# Patient Record
Sex: Female | Born: 1985
Health system: Southern US, Community
[De-identification: ages and names within clinical notes are randomized; demographics above are authoritative.]

## PROBLEM LIST (undated history)

## (undated) ENCOUNTER — Inpatient Hospital Stay (HOSPITAL_COMMUNITY): Payer: Self-pay

## (undated) DIAGNOSIS — J45909 Unspecified asthma, uncomplicated: Secondary | ICD-10-CM

## (undated) DIAGNOSIS — H10823 Rosacea conjunctivitis, bilateral: Secondary | ICD-10-CM

## (undated) DIAGNOSIS — T7840XA Allergy, unspecified, initial encounter: Secondary | ICD-10-CM

## (undated) DIAGNOSIS — J302 Other seasonal allergic rhinitis: Secondary | ICD-10-CM

## (undated) DIAGNOSIS — L718 Other rosacea: Secondary | ICD-10-CM

## (undated) DIAGNOSIS — G43909 Migraine, unspecified, not intractable, without status migrainosus: Secondary | ICD-10-CM

## (undated) HISTORY — DX: Allergy, unspecified, initial encounter: T78.40XA

## (undated) HISTORY — DX: Rosacea conjunctivitis, bilateral: H10.823

## (undated) HISTORY — PX: RETINAL LASER PROCEDURE: SHX2339

## (undated) HISTORY — DX: Other rosacea: L71.8

## (undated) HISTORY — PX: TONSILLECTOMY: SUR1361

## (undated) HISTORY — DX: Migraine, unspecified, not intractable, without status migrainosus: G43.909

## (undated) HISTORY — PX: SINUS SURGERY WITH INSTATRAK: SHX5215

---
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
## 2008-03-08 ENCOUNTER — Other Ambulatory Visit: Admission: RE | Admit: 2008-03-08 | Discharge: 2008-03-08 | Payer: Self-pay | Admitting: Obstetrics and Gynecology

## 2008-10-03 ENCOUNTER — Ambulatory Visit: Payer: Self-pay | Admitting: Diagnostic Radiology

## 2008-10-03 ENCOUNTER — Emergency Department (HOSPITAL_BASED_OUTPATIENT_CLINIC_OR_DEPARTMENT_OTHER): Admission: EM | Admit: 2008-10-03 | Discharge: 2008-10-03 | Payer: Self-pay | Admitting: Emergency Medicine

## 2009-03-01 LAB — HM PAP SMEAR

## 2012-05-15 ENCOUNTER — Ambulatory Visit (INDEPENDENT_AMBULATORY_CARE_PROVIDER_SITE_OTHER): Payer: BC Managed Care – PPO | Admitting: Family Medicine

## 2012-05-15 ENCOUNTER — Encounter: Payer: Self-pay | Admitting: Family Medicine

## 2012-05-15 VITALS — BP 110/60 | HR 90 | Temp 98.3°F | Ht 65.25 in | Wt 118.8 lb

## 2012-05-15 DIAGNOSIS — G43909 Migraine, unspecified, not intractable, without status migrainosus: Secondary | ICD-10-CM | POA: Insufficient documentation

## 2012-05-15 DIAGNOSIS — M62838 Other muscle spasm: Secondary | ICD-10-CM

## 2012-05-15 DIAGNOSIS — M7989 Other specified soft tissue disorders: Secondary | ICD-10-CM

## 2012-05-15 DIAGNOSIS — R040 Epistaxis: Secondary | ICD-10-CM | POA: Insufficient documentation

## 2012-05-15 DIAGNOSIS — M799 Soft tissue disorder, unspecified: Secondary | ICD-10-CM

## 2012-05-15 MED ORDER — CYCLOBENZAPRINE HCL 10 MG PO TABS: 10.0000 mg | ORAL_TABLET | Freq: Three times a day (TID) | ORAL | Status: DC | PRN

## 2012-05-15 NOTE — Patient Instructions (Addendum)
Follow up in 6 weeks to recheck symptoms Start Zyrtec daily to decrease allergies Do nasal saline rinses prior to bed and then apply neosporin If nose bleed- use OB, ice bridge of nose, lean forward Continue the Excedrin migraine as needed Use the flexeril at night for muscle spasm- will make you drowsy HEAT! Aleve (2 at a time w/ food) for neck/shoulder pain We'll call you with your ultrasound appt Call with any questions or concerns Welcome!  We're glad to have you!

## 2012-05-15 NOTE — Progress Notes (Signed)
  Subjective:    Patient ID: Caitlin Duarte, female    DOB: Apr 01, 1985, 27 y.o.   MRN: [id number]  HPI New to establish.  Previous MD- Bloss[facility] Hoops.    S/p sinus surgery 2006.  Now having nose bleeds 'multiple times/week'.  Not draining down back of throat.  Bleeding anteriorly.  Bleeding will alternate nostrils, initially thought it was more the R side.  Will wake up w/ blood on pillow.  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  Pt reports nose doesn't bleed for long.  R sided neck and shoulder pain- will radiate into arm, will get headaches.  Pt has asymmetric musculature- R side is more developed but pt is R hand dominant.  L shoulder is higher and 'there's something there'.  Saw multiple providers in [place] for evaluation.  Had cervical MRI that showed possible disc abnormality.  No N/V recently but had previously.  Migraines- chronic problem, started in HS.  Pt has abortive med available to use at onset of sxs.  Will take excedrin migraine early in HA course   Review of Systems For ROS see HPI     Objective:   Physical Exam  Vitals reviewed. Constitutional: She is oriented to person, place, and time. She appears well-developed and well-nourished. No distress.  HENT:  Head: Normocephalic and atraumatic.  Right Ear: Tympanic membrane normal.  Left Ear: Tympanic membrane normal.  Nose: Mucosal edema and rhinorrhea present. Right sinus exhibits no maxillary sinus tenderness and no frontal sinus tenderness. Left sinus exhibits no maxillary sinus tenderness and no frontal sinus tenderness.  Mouth/Throat: Mucous membranes are normal. Posterior oropharyngeal erythema (w/ PND) present.  Eyes: Conjunctivae and EOM are normal. Pupils are equal, round, and reactive to light.  Neck: Normal range of motion. No thyromegaly present.  + trap spasm bilaterally L soft tissue mass in trap- suspicious for lipoma  Cardiovascular: Normal rate, regular rhythm and normal heart sounds.   Pulmonary/Chest: Effort normal  and breath sounds normal. No respiratory distress. She has no wheezes. She has no rales.  Lymphadenopathy:    She has no cervical adenopathy.  Neurological: She is alert and oriented to person, place, and time. She has normal reflexes. No cranial nerve deficit. Coordination normal.  Skin: Skin is warm and dry.  Psychiatric: She has a normal mood and affect. Her behavior is normal.          Assessment & Plan:

## 2012-05-18 NOTE — Assessment & Plan Note (Signed)
New.  Start scheduled NSAIDs, flexeril prn.  Heat.  Reviewed supportive care and red flags that should prompt return.  Pt expressed understanding and is in agreement w/ plan.  

## 2012-05-18 NOTE — Assessment & Plan Note (Signed)
New to provider, ongoing for pt.  Pt using excedrin migraine and then abortive meds prn.  Will follow.

## 2012-05-18 NOTE — Assessment & Plan Note (Signed)
New.  Suspect lipoma in L trap.  Pt has had extensive neuro/neurosurg w/u but has not had [place].  Will get imaging to assess

## 2012-05-18 NOTE — Assessment & Plan Note (Signed)
New.  No obvious scabs or nasal abnormalities.  + mucosal edema and PND consistent w/ seasonal allergies.  Due to recent nose bleeds will not start nasal steroid.  Will start OTC antihistamine.  Reviewed supportive care and red flags that should prompt return.  Pt expressed understanding and is in agreement w/ plan.

## 2012-05-28 ENCOUNTER — Encounter: Payer: Self-pay | Admitting: Family Medicine

## 2012-06-09 ENCOUNTER — Ambulatory Visit (HOSPITAL_BASED_OUTPATIENT_CLINIC_OR_DEPARTMENT_OTHER)
Admission: RE | Admit: 2012-06-09 | Discharge: 2012-06-09 | Disposition: A | Discharge status: Home / Self Care | Payer: BC Managed Care – PPO | Source: Ambulatory Visit | Attending: Family Medicine | Admitting: Family Medicine

## 2012-06-09 DIAGNOSIS — R221 Localized swelling, mass and lump, neck: Secondary | ICD-10-CM | POA: Insufficient documentation

## 2012-06-09 DIAGNOSIS — M7989 Other specified soft tissue disorders: Secondary | ICD-10-CM

## 2012-06-09 DIAGNOSIS — R220 Localized swelling, mass and lump, head: Secondary | ICD-10-CM | POA: Insufficient documentation

## 2012-07-02 ENCOUNTER — Encounter: Payer: Self-pay | Admitting: Lab

## 2012-07-03 ENCOUNTER — Ambulatory Visit: Payer: BC Managed Care – PPO | Admitting: Family Medicine

## 2012-07-03 DIAGNOSIS — Z0289 Encounter for other administrative examinations: Secondary | ICD-10-CM

## 2012-08-07 ENCOUNTER — Ambulatory Visit (INDEPENDENT_AMBULATORY_CARE_PROVIDER_SITE_OTHER): Payer: BC Managed Care – PPO | Admitting: Family Medicine

## 2012-08-07 ENCOUNTER — Encounter: Payer: Self-pay | Admitting: Family Medicine

## 2012-08-07 VITALS — BP 126/80 | HR 105 | Temp 98.5°F | Ht 65.25 in | Wt 112.2 lb

## 2012-08-07 DIAGNOSIS — J0100 Acute maxillary sinusitis, unspecified: Secondary | ICD-10-CM

## 2012-08-07 MED ORDER — AMOXICILLIN 875 MG PO TABS: 875.0000 mg | ORAL_TABLET | Freq: Two times a day (BID) | ORAL | Status: DC

## 2012-08-07 MED ORDER — PROMETHAZINE-DM 6.25-15 MG/5ML PO SYRP: 5.0000 mL | ORAL_SOLUTION | Freq: Four times a day (QID) | ORAL | Status: DC | PRN

## 2012-08-07 NOTE — Progress Notes (Signed)
  Subjective:    Patient ID: Caitlin Duarte, female    DOB: [birth year], 27 y.o.   MRN: [id number]  HPI URI-sxs started 2 weeks ago w/ allergy sxs (watery eyes, nasal congestion).  sxs have worsened rather than improved.  + cough, productive.  + sinus pain/pressure.  + tooth pain.  + HA.  No fevers.  + sick contacts.  No N/V/D.   Review of Systems For ROS see HPI     Objective:   Physical Exam  Vitals reviewed. Constitutional: She appears well-developed and well-nourished. No distress.  HENT:  Head: Normocephalic and atraumatic.  Right Ear: Tympanic membrane normal.  Left Ear: Tympanic membrane normal.  Nose: Mucosal edema and rhinorrhea present. Right sinus exhibits maxillary sinus tenderness and frontal sinus tenderness. Left sinus exhibits maxillary sinus tenderness and frontal sinus tenderness.  Mouth/Throat: Uvula is midline and mucous membranes are normal. Posterior oropharyngeal erythema present. No oropharyngeal exudate.  Eyes: Conjunctivae and EOM are normal. Pupils are equal, round, and reactive to light.  Neck: Normal range of motion. Neck supple.  Cardiovascular: Normal rate, regular rhythm and normal heart sounds.   Pulmonary/Chest: Effort normal and breath sounds normal. No respiratory distress. She has no wheezes.  Lymphadenopathy:    She has no cervical adenopathy.          Assessment & Plan:

## 2012-08-07 NOTE — Assessment & Plan Note (Signed)
New.  Pt's sxs and PE consistent w/ infxn.  Start abx.  Cough meds prn.  Reviewed supportive care and red flags that should prompt return.  Pt expressed understanding and is in agreement w/ plan.  

## 2012-08-07 NOTE — Patient Instructions (Addendum)
This is a sinus infection Start the Amox twice daily- take w/ food Drink plenty of fluids Cough syrup for nights and weekend- will cause drowsiness Mucinex DM for daytime cough REST! Hang in there!!!

## 2012-09-03 ENCOUNTER — Ambulatory Visit: Payer: BC Managed Care – PPO | Admitting: Family Medicine

## 2012-09-08 ENCOUNTER — Ambulatory Visit: Payer: Self-pay | Admitting: Nurse Practitioner

## 2012-09-15 ENCOUNTER — Ambulatory Visit (INDEPENDENT_AMBULATORY_CARE_PROVIDER_SITE_OTHER): Payer: BC Managed Care – PPO | Admitting: Obstetrics and Gynecology

## 2012-09-15 ENCOUNTER — Encounter: Payer: Self-pay | Admitting: Obstetrics and Gynecology

## 2012-09-15 VITALS — BP 100/58 | HR 88 | Ht 65.0 in | Wt 112.0 lb

## 2012-09-15 DIAGNOSIS — Z01419 Encounter for gynecological examination (general) (routine) without abnormal findings: Secondary | ICD-10-CM

## 2012-09-15 DIAGNOSIS — Z0000 Encounter for general adult medical examination without abnormal findings: Secondary | ICD-10-CM

## 2012-09-15 LAB — POCT URINALYSIS DIPSTICK
Blood, UA: NEGATIVE
Nitrite, UA: NEGATIVE
Protein, UA: NEGATIVE
Urobilinogen, UA: NEGATIVE
pH, UA: 5

## 2012-09-15 MED ORDER — NORETHIN ACE-ETH ESTRAD-FE 1-20 MG-MCG(24) PO TABS: 1.0000 | ORAL_TABLET | Freq: Every day | ORAL | Status: DC

## 2012-09-15 NOTE — Patient Instructions (Signed)

## 2012-09-15 NOTE — Progress Notes (Signed)
Patient ID: Caitlin Duarte, female   DOB: 06-25-1985, 27 y.o.   MRN: [id number] 27 y.o.   Single    Caucasian   female  LMP 08/25/12. G0P0   here for annual exam.   Wants to be back on birth control.  Used LoEstrin 1/20 Fe in past.  Getting married.  Declines STD check. Menses always heavy and regular within a few days.  Nausea and cramps with menses, not every month.   Patient's last menstrual period was 08/25/2012.          Sexually active: yes  The current method of family planning is none.    Exercising: cardio and exercise classes Last mammogram:  never Last pap smear:02/2009 wnl History of abnormal pap: no Smoking:no Alcohol: socially Last colonoscopy: never Last Bone Density:  never Last tetanus shot: up to date Status post Gardasil vaccines. Last cholesterol check: unsure  Hgb: 14              Urine: Neg   Family History  Problem Relation Age of Onset  . Anemia Sister   . Thyroid disease Sister   . Diabetes Paternal Grandmother   . Hypertension Paternal Grandmother   . Diabetes Paternal Grandfather   . Hypertension Paternal Grandfather   . Alzheimer's disease Maternal Grandfather   . Hypertension Father   . Diabetes Father   . Stroke Maternal Grandmother     Patient Active Problem List   Diagnosis Date Noted  . Sinusitis, acute maxillary 08/07/2012  . Epistaxis 05/15/2012  . Migraine, unspecified, without mention of intractable migraine without mention of status migrainosus 05/15/2012  . Soft tissue mass 05/15/2012  . Trapezius muscle spasm 05/15/2012  . MOLLUSCUM CONTAGIOSUM 10/23/2006    Past Medical History  Diagnosis Date  . Migraines     Past Surgical History  Procedure Laterality Date  . Eye surgery    . Nose surgery  2006  . Appendectomy    . Wisdom tooth extraction  2006  . Sinus surgery with instatrak      Allergies: Other  Current Outpatient Prescriptions  Medication Sig Dispense Refill  . cyclobenzaprine (FLEXERIL) 10 MG tablet Take 1  tablet (10 mg total) by mouth 3 (three) times daily as needed for muscle spasms.  30 tablet  0  . amoxicillin (AMOXIL) 875 MG tablet Take 1 tablet (875 mg total) by mouth 2 (two) times daily.  20 tablet  0  . Multiple Vitamin (MULTIVITAMIN) capsule Take 1 capsule by mouth daily.      . promethazine-dextromethorphan (PROMETHAZINE-DM) 6.25-15 MG/5ML syrup Take 5 mLs by mouth 4 (four) times daily as needed.  240 mL  0   No current facility-administered medications for this visit.    ROS: Pertinent items are noted in HPI.  Social Hx:  [employer], broadcasting/film/video - kindergarten.  Getting married.   Exam:    BP 100/58  Pulse 88  Ht 5\' 5"  (1.651 m)  Wt 112 lb (50.803 kg)  BMI 18.64 kg/m2  LMP 08/25/2012   Wt Readings from Last 3 Encounters:  09/15/12 112 lb (50.803 kg)  08/07/12 112 lb 3.2 oz (50.894 kg)  05/15/12 118 lb 12.8 oz (53.887 kg)     Ht Readings from Last 3 Encounters:  09/15/12 5\' 5"  (1.651 m)  08/07/12 5' 5.25" (1.657 m)  05/15/12 5' 5.25" (1.657 m)    General appearance: alert, cooperative and appears stated age Head: Normocephalic, without obvious abnormality, atraumatic Neck: no adenopathy, supple, symmetrical, trachea midline and thyroid not  enlarged, symmetric, no tenderness/mass/nodules Lungs: clear to auscultation bilaterally Breasts: Inspection negative, No nipple retraction or dimpling, No nipple discharge or bleeding, No axillary or supraclavicular adenopathy, Normal to palpation without dominant masses Heart: regular rate and rhythm Abdomen: soft, non-tender; no masses,  no organomegaly Extremities: extremities normal, atraumatic, no cyanosis or edema Skin: Skin color, texture, turgor normal. No rashes or lesions Lymph nodes: Cervical, supraclavicular, and axillary nodes normal. No abnormal inguinal nodes palpated Neurologic: Grossly normal   Pelvic: External genitalia:  no lesions              Urethra:  normal appearing urethra with no masses, tenderness or lesions               Bartholins and Skenes: normal                 Vagina: normal appearing vagina with normal color and discharge, no lesions              Cervix: normal appearance              Pap taken: yes        Bimanual Exam:  Uterus:  uterus is normal size, shape, consistency and nontender                                      Adnexa: normal adnexa in size, nontender and no masses                                         A: normal gynecologic exam     P:  pap smear LoEstrin 24 - 3 packs with 3 refills.  Discussed how to have amenorrhea if menses fall at time of wedding.  Use back up for the first month. return annually or prn     An After Visit Summary was printed and given to the patient.

## 2012-09-16 NOTE — Addendum Note (Signed)
Addended by: Conley Simmonds on: 09/16/2012 12:19 PM   Modules accepted: Orders

## 2012-10-06 ENCOUNTER — Ambulatory Visit: Payer: Self-pay | Admitting: Nurse Practitioner

## 2012-11-25 ENCOUNTER — Encounter: Payer: Self-pay | Admitting: Family Medicine

## 2012-11-25 ENCOUNTER — Ambulatory Visit (INDEPENDENT_AMBULATORY_CARE_PROVIDER_SITE_OTHER): Payer: BC Managed Care – PPO | Admitting: Family Medicine

## 2012-11-25 VITALS — BP 100/60 | HR 83 | Temp 99.1°F | Wt 112.0 lb

## 2012-11-25 DIAGNOSIS — R5381 Other malaise: Secondary | ICD-10-CM

## 2012-11-25 DIAGNOSIS — J069 Acute upper respiratory infection, unspecified: Secondary | ICD-10-CM

## 2012-11-25 NOTE — Patient Instructions (Signed)

## 2012-11-25 NOTE — Progress Notes (Signed)
  Subjective:     Caitlin Duarte is a 27 y.o. female who presents for evaluation of symptoms of a URI. Symptoms include congestion, nasal congestion, post nasal drip and sore throat. Onset of symptoms was several days ago, and has been gradually worsening since that time. Treatment to date: cough suppressants and decongestants. Pt also c/o extreme fatigue and states is spotting with new bcp--- she has been on them 3 months. The following portions of the patient's history were reviewed and updated as appropriate: allergies, current medications, past family history, past medical history, past social history, past surgical history and problem list.  Review of Systems Pertinent items are noted in HPI.   Objective:    BP 100/60  Pulse 83  Temp(Src) 99.1 F (37.3 C) (Oral)  Wt 112 lb (50.803 kg)  BMI 18.64 kg/m2  SpO2 97% General appearance: alert, cooperative, appears stated age and no distress Ears: normal TM's and external ear canals both ears Nose: clear discharge, moderate congestion, no sinus tenderness Throat: lips, mucosa, and tongue normal; teeth and gums normal Neck: moderate anterior cervical adenopathy, supple, symmetrical, trachea midline and thyroid not enlarged, symmetric, no tenderness/mass/nodules Lungs: clear to auscultation bilaterally Heart: S1, S2 normal Extremities: extremities normal, atraumatic, no cyanosis or edema   Assessment:    viral upper respiratory illness  Fatigue-- may be from uri but will check cbcd, ferritin, ibc  Plan:    Discussed diagnosis and treatment of URI. Suggested symptomatic OTC remedies. Nasal saline spray for congestion. Nasal steroids per orders. Follow up as needed. f/u prn

## 2012-11-26 LAB — FERRITIN: Ferritin: 11.9 ng/mL (ref 10.0–291.0)

## 2012-11-26 LAB — CBC WITH DIFFERENTIAL/PLATELET
Basophils Absolute: 0 10*3/uL (ref 0.0–0.1)
Eosinophils Relative: 0.6 % (ref 0.0–5.0)
HCT: 39.2 % (ref 36.0–46.0)
Hemoglobin: 13.2 g/dL (ref 12.0–15.0)
Lymphs Abs: 2.4 10*3/uL (ref 0.7–4.0)
MCV: 90.3 fl (ref 78.0–100.0)
Monocytes Absolute: 0.5 10*3/uL (ref 0.1–1.0)
Monocytes Relative: 4.9 % (ref 3.0–12.0)
Neutro Abs: 7.4 10*3/uL (ref 1.4–7.7)
Platelets: 309 10*3/uL (ref 150.0–400.0)
RDW: 12.7 % (ref 11.5–14.6)

## 2012-11-26 LAB — IBC PANEL: Saturation Ratios: 14.6 % — ABNORMAL LOW (ref 20.0–50.0)

## 2012-11-26 LAB — VITAMIN B12: Vitamin B-12: 337 pg/mL (ref 211–911)

## 2013-01-07 ENCOUNTER — Encounter: Payer: Self-pay | Admitting: Family Medicine

## 2013-01-07 ENCOUNTER — Ambulatory Visit (INDEPENDENT_AMBULATORY_CARE_PROVIDER_SITE_OTHER): Payer: BC Managed Care – PPO | Admitting: Family Medicine

## 2013-01-07 VITALS — BP 114/68 | HR 89 | Temp 99.1°F | Wt 111.8 lb

## 2013-01-07 DIAGNOSIS — R350 Frequency of micturition: Secondary | ICD-10-CM

## 2013-01-07 LAB — POCT URINALYSIS DIPSTICK
Bilirubin, UA: NEGATIVE
Glucose, UA: NEGATIVE
Nitrite, UA: NEGATIVE
Urobilinogen, UA: 0.2

## 2013-01-07 MED ORDER — CIPROFLOXACIN HCL 500 MG PO TABS: 500.0000 mg | ORAL_TABLET | Freq: Two times a day (BID) | ORAL | Status: AC

## 2013-01-07 NOTE — Progress Notes (Signed)
  Subjective:    Caitlin Duarte is a 27 y.o. female who complains of burning with urination, frequency and urgency. She has had symptoms for 7 days. Patient also complains of back pain. Patient denies congestion, cough, fever, headache, rhinitis, sorethroat, stomach ache and vaginal discharge. Patient does not have a history of recurrent UTI. Patient does not have a history of pyelonephritis.   The following portions of the patient's history were reviewed and updated as appropriate: allergies, current medications, past family history, past medical history, past social history, past surgical history and problem list.  Review of Systems Pertinent items are noted in HPI.    Objective:    BP 114/68  Pulse 89  Temp(Src) 99.1 F (37.3 C) (Oral)  Wt 111 lb 12.8 oz (50.712 kg)  BMI 18.6 kg/m2  SpO2 99% General appearance: alert, cooperative, appears stated age and no distress  Laboratory:  Urine dipstick: mod for hemoglobin and 1+ for leukocyte esterase.   Micro exam: not done.    Assessment:      dysuria    Plan:    Medications: ciprofloxacin. Maintain adequate hydration. Follow up if symptoms not improving, and as needed.  Culture pending

## 2013-01-07 NOTE — Patient Instructions (Signed)
Urinary Tract Infection  Urinary tract infections (UTIs) can develop anywhere along your urinary tract. Your urinary tract is your body's drainage system for removing wastes and extra water. Your urinary tract includes two kidneys, two ureters, a bladder, and a urethra. Your kidneys are a pair of bean-shaped organs. Each kidney is about the size of your fist. They are located below your ribs, one on each side of your spine.  CAUSES  Infections are caused by microbes, which are microscopic organisms, including fungi, viruses, and bacteria. These organisms are so small that they can only be seen through a microscope. Bacteria are the microbes that most commonly cause UTIs.  SYMPTOMS   Symptoms of UTIs may vary by age and gender of the patient and by the location of the infection. Symptoms in young women typically include a frequent and intense urge to urinate and a painful, burning feeling in the bladder or urethra during urination. Older women and men are more likely to be tired, shaky, and weak and have muscle aches and abdominal pain. A fever may mean the infection is in your kidneys. Other symptoms of a kidney infection include pain in your back or sides below the ribs, nausea, and vomiting.  DIAGNOSIS  To diagnose a UTI, your caregiver will ask you about your symptoms. Your caregiver also will ask to provide a urine sample. The urine sample will be tested for bacteria and white blood cells. White blood cells are made by your body to help fight infection.  TREATMENT   Typically, UTIs can be treated with medication. Because most UTIs are caused by a bacterial infection, they usually can be treated with the use of antibiotics. The choice of antibiotic and length of treatment depend on your symptoms and the type of bacteria causing your infection.  HOME CARE INSTRUCTIONS   If you were prescribed antibiotics, take them exactly as your caregiver instructs you. Finish the medication even if you feel better after you  have only taken some of the medication.   Drink enough water and fluids to keep your urine clear or pale yellow.   Avoid caffeine, tea, and carbonated beverages. They tend to irritate your bladder.   Empty your bladder often. Avoid holding urine for long periods of time.   Empty your bladder before and after sexual intercourse.   After a bowel movement, women should cleanse from front to back. Use each tissue only once.  SEEK MEDICAL CARE IF:    You have back pain.   You develop a fever.   Your symptoms do not begin to resolve within 3 days.  SEEK IMMEDIATE MEDICAL CARE IF:    You have severe back pain or lower abdominal pain.   You develop chills.   You have nausea or vomiting.   You have continued burning or discomfort with urination.  MAKE SURE YOU:    Understand these instructions.   Will watch your condition.   Will get help right away if you are not doing well or get worse.  Document Released: 12/06/2004 Document Revised: 08/28/2011 Document Reviewed: 04/06/2011  [employer] Patient Information 2014 [employer].

## 2013-01-07 NOTE — Telephone Encounter (Signed)
error 

## 2013-01-09 LAB — URINE CULTURE: Colony Count: >=100000

## 2013-01-15 ENCOUNTER — Other Ambulatory Visit: Payer: Self-pay

## 2013-06-02 ENCOUNTER — Telehealth: Payer: Self-pay | Admitting: Nurse Practitioner

## 2013-06-02 NOTE — Telephone Encounter (Signed)
Patient is asking for refills of birth control until aex. Patient has aex 09/17/13 with [facility].  [facility] @ [address]

## 2013-06-02 NOTE — Telephone Encounter (Signed)
I spoke with Caitlin Duarte at [facility] and pt just picked up RX on 3/17.  Advised pt to call [facility] when she starts her last pack to request refill and we will refill through AEX.  Pt voices understanding and is agreeable.

## 2013-06-05 ENCOUNTER — Ambulatory Visit: Payer: BC Managed Care – PPO | Admitting: Physician Assistant

## 2013-08-17 ENCOUNTER — Telehealth: Payer: Self-pay | Admitting: Nurse Practitioner

## 2013-08-17 MED ORDER — NORETHIN ACE-ETH ESTRAD-FE 1-20 MG-MCG(24) PO TABS: 1.0000 | ORAL_TABLET | Freq: Every day | ORAL | Status: DC

## 2013-08-17 NOTE — Telephone Encounter (Signed)
Left message to call [place] at [phone].  Inform patient 1 pack of OCP sent to pharmacy of choice to get her to next AEX on 09/17/2013.

## 2013-08-17 NOTE — Telephone Encounter (Signed)
Pt says her pharmacy does not have anymore refills for her [place].[facility] at [phone].

## 2013-08-19 NOTE — Telephone Encounter (Signed)
Spoke with patient. Advised 1 pack of OCP sent to pharmacy of choice. Patient agreeable and verbalizes understanding.  Routing to provider for final review. Patient agreeable to disposition. Will close encounter

## 2013-09-07 ENCOUNTER — Telehealth: Payer: Self-pay | Admitting: Family Medicine

## 2013-09-07 ENCOUNTER — Ambulatory Visit (INDEPENDENT_AMBULATORY_CARE_PROVIDER_SITE_OTHER): Payer: BC Managed Care – PPO | Admitting: Family Medicine

## 2013-09-07 ENCOUNTER — Encounter: Payer: Self-pay | Admitting: Family Medicine

## 2013-09-07 VITALS — BP 120/80 | HR 104 | Temp 98.2°F | Resp 17 | Wt 115.5 lb

## 2013-09-07 DIAGNOSIS — D239 Other benign neoplasm of skin, unspecified: Secondary | ICD-10-CM

## 2013-09-07 DIAGNOSIS — J3089 Other allergic rhinitis: Secondary | ICD-10-CM

## 2013-09-07 DIAGNOSIS — J302 Other seasonal allergic rhinitis: Secondary | ICD-10-CM

## 2013-09-07 DIAGNOSIS — J45909 Unspecified asthma, uncomplicated: Secondary | ICD-10-CM | POA: Insufficient documentation

## 2013-09-07 DIAGNOSIS — J4530 Mild persistent asthma, uncomplicated: Secondary | ICD-10-CM

## 2013-09-07 DIAGNOSIS — D229 Melanocytic nevi, unspecified: Secondary | ICD-10-CM | POA: Insufficient documentation

## 2013-09-07 DIAGNOSIS — J309 Allergic rhinitis, unspecified: Secondary | ICD-10-CM | POA: Insufficient documentation

## 2013-09-07 MED ORDER — ALBUTEROL SULFATE HFA 108 (90 BASE) MCG/ACT IN AERS: 2.0000 | INHALATION_SPRAY | RESPIRATORY_TRACT | Status: DC | PRN

## 2013-09-07 MED ORDER — ALBUTEROL SULFATE (2.5 MG/3ML) 0.083% IN NEBU
2.5000 mg | INHALATION_SOLUTION | Freq: Once | RESPIRATORY_TRACT | Status: AC
  Administered 2013-09-07: 2.5 mg via RESPIRATORY_TRACT

## 2013-09-07 MED ORDER — FLUTICASONE PROPIONATE 50 MCG/ACT NA SUSP: 2.0000 | Freq: Every day | NASAL | Status: DC

## 2013-09-07 MED ORDER — PROMETHAZINE-DM 6.25-15 MG/5ML PO SYRP: 5.0000 mL | ORAL_SOLUTION | Freq: Four times a day (QID) | ORAL | Status: DC | PRN

## 2013-09-07 NOTE — Assessment & Plan Note (Signed)
New.  Pt's sxs not well controlled w/ OTC antihistamine.  Add nasal steroid spray.  Will follow.

## 2013-09-07 NOTE — Telephone Encounter (Signed)
Dr Tonia Brooms or [place] if possible

## 2013-09-07 NOTE — Progress Notes (Signed)
   Subjective:    Patient ID: Caitlin Duarte, female    DOB: 03/22/1985, 28 y.o.   MRN: [id number]  Cough   Productive cough- sxs started 3 weeks ago.  Initially thought it was allergy related but no improvement w/ Zyrtec.  Cough is productive.  Will occur w/ talking, laughing, cough is worse at night.  Taking Mucinex w/o relief.  No fevers.  No sinus pain/pressure.  Atypical moles- pt has hx of this.  Now w/ mole on back of L ear and 2 on chest that she is concerned about.  Review of Systems  Respiratory: Positive for cough.    For ROS see HPI     Objective:   Physical Exam  Vitals reviewed. Constitutional: She appears well-developed and well-nourished. No distress.  HENT:  Head: Normocephalic and atraumatic.  Right Ear: Tympanic membrane normal.  Left Ear: Tympanic membrane normal.  Nose: Mucosal edema and rhinorrhea present. Right sinus exhibits no maxillary sinus tenderness and no frontal sinus tenderness. Left sinus exhibits no maxillary sinus tenderness and no frontal sinus tenderness.  Mouth/Throat: Mucous membranes are normal. Posterior oropharyngeal erythema (w/ PND) present.  Eyes: Conjunctivae and EOM are normal. Pupils are equal, round, and reactive to light.  Neck: Normal range of motion. Neck supple.  Cardiovascular: Normal rate, regular rhythm and normal heart sounds.   Pulmonary/Chest: Effort normal and breath sounds normal. No respiratory distress. She has no wheezes. She has no rales.  Lymphadenopathy:    She has no cervical adenopathy.  Skin: Skin is warm and dry.  Atypical moles on chest and on posterior L ear          Assessment & Plan:

## 2013-09-07 NOTE — Progress Notes (Signed)
Pre visit review using our clinic review tool, if applicable. No additional management support is needed unless otherwise documented below in the visit note. 

## 2013-09-07 NOTE — Assessment & Plan Note (Signed)
New.  3 obvious moles in question- 1 on L posterior ear, 2 on upper chest.  Refer to derm for evaluation and tx.

## 2013-09-07 NOTE — Assessment & Plan Note (Signed)
New.  Suspect this is airway inflammation due to copious PND and untreated seasonal allergies.  Air movement improved s/p neb tx in office and pt felt chest was less tight- no cough heard after treatment.  Start albuterol HFA prn.  Reviewed supportive care and red flags that should prompt return.  Pt expressed understanding and is in agreement w/ plan.

## 2013-09-07 NOTE — Telephone Encounter (Signed)
[facility] will not schedule for the patient at this time.  She has several no shows with them.  Who would you like me to schedule with instead

## 2013-09-07 NOTE — Patient Instructions (Signed)
Follow up as needed Start the Albuterol inhaler- 2 puffs every 4 hrs as needed for cough, chest tightness, wheezing Continue the Zyrtec daily Add nasal spray- 2 sprays each nostril daily Mucinex to thin your drainage Drink plenty of fluids Use the cough syrup at night- will cause drowsiness Call with any questions or concerns Hang in there!

## 2013-09-17 ENCOUNTER — Ambulatory Visit: Payer: BC Managed Care – PPO | Admitting: Nurse Practitioner

## 2013-09-17 ENCOUNTER — Telehealth: Payer: Self-pay | Admitting: Nurse Practitioner

## 2013-09-17 ENCOUNTER — Ambulatory Visit: Payer: BC Managed Care – PPO | Admitting: Obstetrics and Gynecology

## 2013-09-17 MED ORDER — NORETHIN ACE-ETH ESTRAD-FE 1-20 MG-MCG(24) PO TABS: 1.0000 | ORAL_TABLET | Freq: Every day | ORAL | Status: DC

## 2013-09-17 NOTE — Telephone Encounter (Signed)
Appt canceled by provider.  Rx Loestrin 24 Fe sent #1pack/1 refill LM for pt re: Rx sent to pharmacy.

## 2013-09-17 NOTE — Telephone Encounter (Signed)
Patient had her appointment with Kem [place], NP rescheduled today and she will need refills to last until next AEX 10/27/13.

## 2013-09-18 ENCOUNTER — Other Ambulatory Visit: Payer: Self-pay | Admitting: Nurse Practitioner

## 2013-09-18 NOTE — Telephone Encounter (Signed)
Left message about available appointment on 09/21/13 with PG@ 8:30am

## 2013-09-21 MED ORDER — NORETHIN ACE-ETH ESTRAD-FE 1-20 MG-MCG(24) PO TABS: 1.0000 | ORAL_TABLET | Freq: Every day | ORAL | Status: DC

## 2013-09-21 NOTE — Telephone Encounter (Signed)
Loestrin 24 FE--patient's pharmacy calling on patient's behalf to request RX in 3 months increments.

## 2013-09-21 NOTE — Telephone Encounter (Signed)
LM on patient's vm that rx has been sent. 

## 2013-09-21 NOTE — Telephone Encounter (Signed)
Last refilled:09/17/13 #1 pack with 1 refill AEX scheduled: 10/27/13  With Ms. [place] requesting 3 month supply okay to refill?

## 2013-10-27 ENCOUNTER — Ambulatory Visit (INDEPENDENT_AMBULATORY_CARE_PROVIDER_SITE_OTHER): Payer: BC Managed Care – PPO | Admitting: Nurse Practitioner

## 2013-10-27 ENCOUNTER — Encounter: Payer: Self-pay | Admitting: Nurse Practitioner

## 2013-10-27 VITALS — BP 120/80 | HR 72 | Ht 65.5 in | Wt 116.0 lb

## 2013-10-27 DIAGNOSIS — Z01419 Encounter for gynecological examination (general) (routine) without abnormal findings: Secondary | ICD-10-CM

## 2013-10-27 DIAGNOSIS — Z0000 Encounter for general adult medical examination without abnormal findings: Secondary | ICD-10-CM

## 2013-10-27 LAB — POCT URINALYSIS DIPSTICK
BILIRUBIN UA: NEGATIVE
GLUCOSE UA: NEGATIVE
LEUKOCYTES UA: NEGATIVE
Nitrite, UA: NEGATIVE
Protein, UA: NEGATIVE
Urobilinogen, UA: NEGATIVE
pH, UA: 6

## 2013-10-27 LAB — HEMOGLOBIN, FINGERSTICK: HEMOGLOBIN, FINGERSTICK: 14.2 g/dL (ref 12.0–16.0)

## 2013-10-27 MED ORDER — NORETHIN ACE-ETH ESTRAD-FE 1-20 MG-MCG(24) PO TABS: 1.0000 | ORAL_TABLET | Freq: Every day | ORAL | Status: DC

## 2013-10-27 NOTE — Patient Instructions (Signed)

## 2013-10-27 NOTE — Progress Notes (Signed)
28 y.o. G0P0 Single Caucasian Fe here for annual exam.  Now on generic Gildess and having BTB and increase in PMS and bloating.  Did well on Loestrin 24 and wants to return to that brand.  Menses now at 2-3 days. Wants to discuss potentially starting a family next year.  Patient's last menstrual period was 10/13/2013.          Sexually active: Yes.    The current method of family planning is oral progesterone-only contraceptive.    Exercising: Yes.    Home exercise routine includes cardio, strength training 3 times a week. Smoker:  no  Health Maintenance: Pap:  09/16/12 neg HR HPV TDaP:  2011 Gardasil:  completed 03/01/09 Labs: Hgb: 14.2    UA: small ketones, RBC trace, ph 6.0   reports that she has never smoked. She does not have any smokeless tobacco history on file. She reports that she drinks alcohol. She reports that she does not use illicit drugs.  Past Medical History  Diagnosis Date  . Migraines     Past Surgical History  Procedure Laterality Date  . Eye surgery    . Nose surgery  2006  . Appendectomy    . Wisdom tooth extraction  2006  . Sinus surgery with instatrak      Current Outpatient Prescriptions  Medication Sig Dispense Refill  . albuterol (PROAIR HFA) 108 (90 BASE) MCG/ACT inhaler Inhale 2 puffs into the lungs every 4 (four) hours as needed for wheezing or shortness of breath.  1 Inhaler  6  . cyclobenzaprine (FLEXERIL) 10 MG tablet Take 1 tablet (10 mg total) by mouth 3 (three) times daily as needed for muscle spasms.  30 tablet  0  . Multiple Vitamin (MULTIVITAMIN) capsule Take 1 capsule by mouth daily.      . Norethindrone Acetate-Ethinyl Estrad-FE (LOESTRIN 24 FE) 1-20 MG-MCG(24) tablet Take 1 tablet by mouth daily.  3 Package  3  . fluticasone (FLONASE) 50 MCG/ACT nasal spray Place 2 sprays into both nostrils daily.  16 g  6   No current facility-administered medications for this visit.    Family History  Problem Relation Age of Onset  . Anemia Sister    . Thyroid disease Sister   . Diabetes Paternal Grandmother   . Hypertension Paternal Grandmother   . Diabetes Paternal Grandfather   . Hypertension Paternal Grandfather   . Alzheimer's disease Maternal Grandfather   . Hypertension Father   . Diabetes Father   . Stroke Maternal Grandmother     ROS:  Pertinent items are noted in HPI.  Otherwise, a comprehensive ROS was negative.  Exam:   BP 120/80  Pulse 72  Ht 5' 5.5" (1.664 m)  Wt 116 lb (52.617 kg)  BMI 19.00 kg/m2  LMP 10/13/2013 Height: 5' 5.5" (166.4 cm)  Ht Readings from Last 3 Encounters:  10/27/13 5' 5.5" (1.664 m)  09/15/12 5\' 5"  (1.651 m)  08/07/12 5' 5.25" (1.657 m)    General appearance: alert, cooperative and appears stated age Head: Normocephalic, without obvious abnormality, atraumatic Neck: no adenopathy, supple, symmetrical, trachea midline and thyroid normal to inspection and palpation Lungs: clear to auscultation bilaterally Breasts: normal appearance, no masses or tenderness Heart: regular rate and rhythm Abdomen: soft, non-tender; no masses,  no organomegaly Extremities: extremities normal, atraumatic, no cyanosis or edema Skin: Skin color, texture, turgor normal. No rashes or lesions Lymph nodes: Cervical, supraclavicular, and axillary nodes normal. No abnormal inguinal nodes palpated Neurologic: Grossly normal   Pelvic:  External genitalia:  no lesions              Urethra:  normal appearing urethra with no masses, tenderness or lesions              Bartholin's and Skene's: normal                 Vagina: normal appearing vagina with normal color and discharge, no lesions              Cervix: anteverted              Pap taken: No. Bimanual Exam:  Uterus:  normal size, contour, position, consistency, mobility, non-tender              Adnexa: no mass, fullness, tenderness               Rectovaginal: Confirms               Anus:  normal sphincter tone, no lesions  A:  Well Woman with normal  exam  Contraception   SE with Gildess  P:   Reviewed health and wellness pertinent to exam  Pap smear  not taken today  Refill on Loestrin 24 - her original RX for a year  Also discussed preconceptual counseling  Counseled on breast self exam, use and side effects of OCP's, adequate intake of calcium and vitamin D, diet and exercise return annually or prn  An After Visit Summary was printed and given to the patient.

## 2013-10-27 NOTE — Progress Notes (Signed)
Prescription failed to [facility] s/w pharmacist (Tom)  Called in Loestrin 24 Fe 1/20 #3 packs with 3 refills.

## 2013-10-30 ENCOUNTER — Ambulatory Visit (INDEPENDENT_AMBULATORY_CARE_PROVIDER_SITE_OTHER): Payer: BC Managed Care – PPO | Admitting: Medical

## 2013-10-30 ENCOUNTER — Encounter: Payer: Self-pay | Admitting: Medical

## 2013-10-30 VITALS — BP 105/70 | HR 114 | Temp 98.4°F | Wt 115.6 lb

## 2013-10-30 DIAGNOSIS — M546 Pain in thoracic spine: Secondary | ICD-10-CM

## 2013-10-30 DIAGNOSIS — M5441 Lumbago with sciatica, right side: Secondary | ICD-10-CM

## 2013-10-30 DIAGNOSIS — M5430 Sciatica, unspecified side: Secondary | ICD-10-CM

## 2013-10-30 DIAGNOSIS — M549 Dorsalgia, unspecified: Secondary | ICD-10-CM | POA: Insufficient documentation

## 2013-10-30 MED ORDER — CYCLOBENZAPRINE HCL 10 MG PO TABS: 10.0000 mg | ORAL_TABLET | Freq: Three times a day (TID) | ORAL | Status: DC | PRN

## 2013-10-30 MED ORDER — DICLOFENAC SODIUM 75 MG PO TBEC: 75.0000 mg | DELAYED_RELEASE_TABLET | Freq: Two times a day (BID) | ORAL | Status: DC

## 2013-10-30 MED ORDER — CYCLOBENZAPRINE HCL 10 MG PO TABS: 10.0000 mg | ORAL_TABLET | Freq: Every day | ORAL | Status: DC

## 2013-10-30 MED ORDER — TRAMADOL HCL 50 MG PO TABS: 50.0000 mg | ORAL_TABLET | Freq: Four times a day (QID) | ORAL | Status: DC | PRN

## 2013-10-30 MED ORDER — KETOROLAC TROMETHAMINE 60 MG/2ML IM SOLN: 60.0000 mg | Freq: Once | INTRAMUSCULAR | Status: DC

## 2013-10-30 NOTE — Progress Notes (Signed)
   Subjective:    Patient ID: Caitlin Duarte, female    DOB: 06-18-1985, 28 y.o.   MRN: [id number]  HPI  Pt in stating that last night at open house got back pain. She bent over to hug and pick up a child. Felt pain immediately. Since then pain level is high. Pain is 8-9/10. Pain is in lower back. Pain position dependent. Some pain shooting toward her rt leg. No weakness in leg. No saddle anesthesia or incontinence. No history of back pain. LMP- July 28th.  The child she picked up was not that large.   Review of Systems  Constitutional: Negative for fever, chills and fatigue.  Respiratory: Negative for cough, chest tightness, shortness of breath and wheezing.   Gastrointestinal: Negative.   Genitourinary: Negative.   Musculoskeletal: Positive for back pain.       Mid lumbar, rt si mostly. Some faint rt hip area pain associated with backpain.  Neurological: Negative for dizziness, speech difficulty, weakness, light-headedness and numbness.       Some pain from her lower back that radiates to her rt buttox and rt hamstring area.  Hematological: Negative for adenopathy. Does not bruise/bleed easily.         Objective:   Physical Exam  Constitutional: She is oriented to person, place, and time. She appears well-developed and well-nourished. No distress.  Cardiovascular: Normal rate, regular rhythm and normal heart sounds.   Pulmonary/Chest: Breath sounds normal. No respiratory distress. She has no wheezes. She has no rales. She exhibits no tenderness.  Abdominal: Soft. Bowel sounds are normal. She exhibits no distension and no mass. There is no tenderness. There is no rebound and no guarding.  Musculoskeletal:  Obvious pain changing positions sitting to supine. Mid lumbar tenderness, rt si tenderness to palpation. Pain on straight leg lift rt leg.   Rt hip- from no pain.  Lower ext- 5/5 lower extremity strength symmetric.  Rt side- para thoracic pain on movement but faint(compared to  lumbar spine pain)  Neurological: She is alert and oriented to person, place, and time.  Lower ext- normal patellar reflexes. L5-s1 sensation intact bilaterally, no foot drop bilaterally.  Skin: She is not diaphoretic.           Assessment & Plan:

## 2013-10-30 NOTE — Patient Instructions (Signed)
Your back pain which is likely a strain resulting from lifting the child. You do have some features of sciatica as well. With the diclofenac, muscle relaxant and tramadol you should gradually get better. Do stretching exercises as tolerated.  If your pain persists by 7-10 days then would recommend xray particularly if you have radiating pain down your leg. Any severe pain, leg weakness, foot drop then ED evaluation. Follow up in 7-10 days or as needed

## 2013-10-30 NOTE — Assessment & Plan Note (Signed)
Diclofenac, cyclobenzaprene, and tramadol. SEE avs.  Regarding mild thoracic pain will follow. Expect that to resolve quickly.

## 2013-10-30 NOTE — Progress Notes (Signed)
Pre-visit discussion using our clinic review tool. No additional management support is needed unless otherwise documented below in the visit note.  

## 2013-11-01 NOTE — Progress Notes (Signed)
Encounter reviewed by Dr. Brook Silva.  

## 2014-01-04 ENCOUNTER — Ambulatory Visit (INDEPENDENT_AMBULATORY_CARE_PROVIDER_SITE_OTHER): Payer: BC Managed Care – PPO | Admitting: Family Medicine

## 2014-01-04 ENCOUNTER — Encounter: Payer: Self-pay | Admitting: Family Medicine

## 2014-01-04 VITALS — BP 110/80 | HR 107 | Temp 98.7°F | Resp 16 | Wt 115.4 lb

## 2014-01-04 DIAGNOSIS — N912 Amenorrhea, unspecified: Secondary | ICD-10-CM

## 2014-01-04 DIAGNOSIS — K219 Gastro-esophageal reflux disease without esophagitis: Secondary | ICD-10-CM | POA: Insufficient documentation

## 2014-01-04 DIAGNOSIS — J301 Allergic rhinitis due to pollen: Secondary | ICD-10-CM

## 2014-01-04 LAB — POCT URINE PREGNANCY: PREG TEST UR: NEGATIVE

## 2014-01-04 MED ORDER — RANITIDINE HCL 150 MG PO TABS: 150.0000 mg | ORAL_TABLET | Freq: Every day | ORAL | Status: DC

## 2014-01-04 NOTE — Progress Notes (Signed)
Pre visit review using our clinic review tool, if applicable. No additional management support is needed unless otherwise documented below in the visit note. 

## 2014-01-04 NOTE — Progress Notes (Signed)
   Subjective:    Patient ID: Caitlin Duarte, female    DOB: Apr 14, 1985, 28 y.o.   MRN: [id number]  HPI URI- sxs started ~1 week ago.  Painful to breathe.  Cough is dry, not productive.  + nasal congestion and drainage- thick yellow.  No sinus pain.  + sore throat and PND.  No ear pain but itchy.  Not currently taking allergy meds b/c pt may be pregnant.  + GERD recently- worse at night.   Review of Systems For ROS see HPI     Objective:   Physical Exam  Vitals reviewed. Constitutional: She appears well-developed and well-nourished. No distress.  HENT:  Head: Normocephalic and atraumatic.  Right Ear: Tympanic membrane normal.  Left Ear: Tympanic membrane normal.  Nose: Mucosal edema and rhinorrhea present. Right sinus exhibits no maxillary sinus tenderness and no frontal sinus tenderness. Left sinus exhibits no maxillary sinus tenderness and no frontal sinus tenderness.  Mouth/Throat: Mucous membranes are normal. Posterior oropharyngeal erythema (w/ PND) present.  Eyes: Conjunctivae and EOM are normal. Pupils are equal, round, and reactive to light.  Neck: Normal range of motion. Neck supple.  Cardiovascular: Normal rate, regular rhythm and normal heart sounds.   Pulmonary/Chest: Effort normal and breath sounds normal. No respiratory distress. She has no wheezes. She has no rales. She exhibits no tenderness.  Lymphadenopathy:    She has no cervical adenopathy.          Assessment & Plan:

## 2014-01-04 NOTE — Patient Instructions (Signed)
Follow up as needed Start the Zantac daily to decrease heartburn- take before bed Start Claritin or Zyrtec daily to decrease allergy symptoms and post-nasal drip Robitussin as needed for cough Drink plenty of fluids REST! Call with any questions or concerns or if not improving GOOD LUCK!!  FINGERS CROSSED!

## 2014-01-05 DIAGNOSIS — N912 Amenorrhea, unspecified: Secondary | ICD-10-CM | POA: Insufficient documentation

## 2014-01-05 NOTE — Assessment & Plan Note (Signed)
Negative Upreg today.  Pt is trying to be pregnant.  Encouraged her that this can be a process and to continue her healthy lifestyle and try not to stress about the timing.  Will follow.

## 2014-01-05 NOTE — Assessment & Plan Note (Addendum)
New.  Reviewed dietary and lifestyle modifications.  Start Zantac nightly.  Suspect this is the cause of the tightness in pt's chest.  Reviewed supportive care and red flags that should prompt return.  Pt expressed understanding and is in agreement w/ plan.

## 2014-01-05 NOTE — Assessment & Plan Note (Signed)
Deteriorated.  Pt to restart OTC antihistamine- Category B in pregnancy.  Suspect her cough is due to copious PND.  Pt safe to take Robitussin prn.  Reviewed supportive care and red flags that should prompt return.  Pt expressed understanding and is in agreement w/ plan.

## 2014-01-19 ENCOUNTER — Encounter: Payer: Self-pay | Admitting: Nurse Practitioner

## 2014-01-19 ENCOUNTER — Ambulatory Visit (INDEPENDENT_AMBULATORY_CARE_PROVIDER_SITE_OTHER): Payer: BC Managed Care – PPO | Admitting: Nurse Practitioner

## 2014-01-19 VITALS — BP 100/60 | HR 96 | Ht 65.5 in | Wt 114.0 lb

## 2014-01-19 DIAGNOSIS — Z3169 Encounter for other general counseling and advice on procreation: Secondary | ICD-10-CM

## 2014-01-19 NOTE — Progress Notes (Signed)
28 y.o. MW Female here for preconceptual counseling.  Gynecological History:    Menarche: age 73   LMP: 01/07/14 Length of cycle:28 days Length of Menses: 5 GYN infectious disease history:  (Abnormal pap, venereal warts, herpes, or other STD's   )No Current Birth Control method: condoms each time Last time birth control was used: end of September.  PMH:  Any history of DM, HTN, epilepsy, Heart Murmur, or thyroid problems?  No   If so, when did it begin? Are you or have you ever been anemic?  No I Have you ever had any accidents? No Do you have any allergies?  Yes  To Ronna Polio you take any sedatives or tranquilizers? No Any domestic violence? No Any medications? Yes - allergy and prenatal MV   (such as medications's for acne - certain medications can cause birth defects and Ace inhibitors can cause kidney problems in the fetus.)  Patient's Past Medical History:  Have you ever had surgery related to female organs? No Past pregnancies/ complications/ or miscarriages/ abortions? No ETOH? Yes - social only Tobacco Use?  No Drug use? No  Reviewed Medication list: No Current job exposure risk - toxins/ Lead/ Mercury?   No Hot tub/ sauna use? No Do you commonly run long distance or do strenuous exercise? No Do you eat a strict vegetarian diet? No  Partners Past Medical History:  Have you ever had surgery related to female organs? No Previous Paternity?  No Testicular Injury?  No ETOH Yes social only Tobacco use:  No Drug use:   No Current medications: none Current job exposure risk toxins/ lead/ mercury:   No Hot/ tub sauna use?  No Do you commonly run long distance or do strenuous exercise?  No   Patient's Family Medical history: No Partners Family Medical History: No  Ethnic background? Mediterranean/ Asian/Chinese/ Ashkenazi Jews / [place] / [place]   Patients [place]:      Partners [place]: Multiple Births  No   Multiple Births Yes Genetic Disorders No   Genetic Disorders No  Sickle cell      Sickle Cell  Hemophilia      Hemophilia  Cystic Fibrosis     Cystic Fibrosis  Mental retardation     Mental retardation  Downs Syndrome     Downs Syndrome  Immunization Updates: Rubella Vaccine/ titer Yes Chicken Pox / vaccination Yes Toxoplasmosis exposure (no changing litter box) Yes Tdap for pt. and partner Yes Hepatitis B (if at high risk) Yes Influenza vaccine who may get pregnant during the flu season Yes  Recommendations:    No ETOH / Tobacco / Drugs  Limit Caffeine  No Artificial Sweeteners  No raw beef  Restrict High fat foods  Limit servings of large fish (e.g., swordfish) to 1 X month  Foods associated with Listeria transmission ( e.g., sliced delicatessen meats &  Cheese)  No hot / tub Saunas  OTC med list  Counseling:   Normal pregnancy rates 80 % within 1 year  Rx. Prenatal Multivitamins  Discussion of timing of intercourse to ovulation  Labs:  Time spent with patient : 25 minutes

## 2014-01-19 NOTE — Patient Instructions (Signed)
Preparing for Pregnancy Before trying to become pregnant, make an appointment with your health care provider (preconception care). The goal is to help you have a healthy, safe pregnancy. At your first appointment, your health care provider will:   Do a complete physical exam, including a Pap test.  Take a complete medical history.  Give you advice and help you resolve any problems. PRECONCEPTION CHECKLIST Here is a list of the basics to cover with your health care provider at your preconception visit:  Medical history.  Tell your health care provider about any diseases you have had. Many diseases can affect your pregnancy.  Include your partner's medical history and family history.  Make sure you have been tested for sexually transmitted infections (STIs). These can affect your pregnancy. In some cases, they can be passed to your baby. Tell your health care provider about any history of STIs.  Make sure your health care provider knows about any previous problems you have had with conception or pregnancy.  Tell your health care provider about any medicine you take. This includes herbal supplements and over-the-counter medicines.  Make sure all your immunizations are up to date. You may need to make additional appointments.  Ask your health care provider if you need any vaccinations or if there are any you should avoid.  Diet.  It is especially important to eat a healthy, balanced diet with the right nutrients when you are pregnant.  Ask your health care provider to help you get to a healthy weight before pregnancy.  If you are overweight, you are at higher risk for certain complications. These include high blood pressure, diabetes, and preterm birth.  If you are underweight, you are more likely to have a low-birth-weight baby.  Lifestyle.  Tell your health care provider about lifestyle factors such as alcohol use, drug use, or smoking.  Describe any harmful substances you may  be exposed to at work or home. These can include chemicals, pesticides, and radiation.  Mental health.  Let your health care provider know if you have been feeling depressed or anxious.  Let your health care provider know if you have a history of substance abuse.  Let your health care provider know if you do not feel safe at home. HOME INSTRUCTIONS TO PREPARE FOR PREGNANCY Follow your health care provider's advice and instructions.   Keep an accurate record of your menstrual periods. This makes it easier for your health care provider to determine your baby's due date.  Begin taking prenatal vitamins and folic acid supplements daily. Take them as directed by your health care provider.  Eat a balanced diet. Get help from a nutrition counselor if you have questions or need help.  Get regular exercise. Try to be active for at least 30 minutes a day most days of the week.  Quit smoking, if you smoke.  Do not drink alcohol.  Do not take illegal drugs.  Get medical problems, such as diabetes or high blood pressure, under control.  If you have diabetes, make sure you do the following:  Have good blood sugar control. If you have type 1 diabetes, use multiple daily doses of insulin. Do not use split-dose or premixed insulin.  Have an eye exam by a qualified eye care professional trained in caring for people with diabetes.  Get evaluated by your health care provider for cardiovascular disease.  Get to a healthy weight. If you are overweight or obese, reduce your weight with the help of a qualified health   professional such as a registered dietitian. Ask your health care provider what the right weight range is for you. HOW DO I KNOW I AM PREGNANT? You may be pregnant if you have been sexually active and you miss your period. Symptoms of early pregnancy include:   Mild cramping.  Very light vaginal bleeding (spotting).  Feeling unusually tired.  Morning sickness. If you have any of  these symptoms, take a home pregnancy test. These tests look for a hormone called human chorionic gonadotropin (hCG) in your urine. Your body begins to make this hormone during early pregnancy. These tests are very accurate. Wait until at least the first day you miss your period to take one. If you get a positive result, call your health care provider to make appointments for prenatal care. WHAT SHOULD I DO IF I BECOME PREGNANT?  Make an appointment with your health care provider by week 12 of your pregnancy at the latest.  Do not smoke. Smoking can be harmful to your baby.  Do not drink alcoholic beverages. Alcohol is related to a number of birth defects.  Avoid toxic odors and chemicals.  You may continue to have sexual intercourse if it does not cause pain or other problems, such as vaginal bleeding. Document Released: 02/09/2008 Document Revised: 07/13/2013 Document Reviewed: 02/02/2013 ExitCare Patient Information 2015 [employer]. This information is not intended to replace advice given to you by your health care provider. Make sure you discuss any questions you have with your health care provider.  

## 2014-01-21 NOTE — Progress Notes (Signed)
Encounter reviewed by Dr. Arbor Leer Silva.  

## 2014-03-12 NOTE — L&D Delivery Note (Signed)
Delivery Note At 11:25 AM a viable and healthy female was delivered via Vaginal, Spontaneous Delivery (Presentation: Right Occiput Anterior).  APGAR: 9, 9; weight  .   Placenta status: Intact, Spontaneous.  Cord:  with the following complications: None.   Anesthesia: Epidural  Episiotomy: None Lacerations: 2nd degree Suture Repair: 2.0 vicryl Est. Blood Loss (mL):  634 cc  Mom to postpartum.  Baby to Couplet care / Skin to Skin.  Rupal Childress H. 01/03/2015, 11:56 AM

## 2014-03-17 ENCOUNTER — Ambulatory Visit (HOSPITAL_BASED_OUTPATIENT_CLINIC_OR_DEPARTMENT_OTHER)
Admission: RE | Admit: 2014-03-17 | Discharge: 2014-03-17 | Disposition: A | Discharge status: Home / Self Care | Payer: BC Managed Care – PPO | Source: Ambulatory Visit | Attending: Family Medicine | Admitting: Family Medicine

## 2014-03-17 ENCOUNTER — Ambulatory Visit: Payer: BC Managed Care – PPO | Admitting: Family Medicine

## 2014-03-17 ENCOUNTER — Ambulatory Visit (INDEPENDENT_AMBULATORY_CARE_PROVIDER_SITE_OTHER): Payer: BC Managed Care – PPO | Admitting: Family Medicine

## 2014-03-17 ENCOUNTER — Encounter: Payer: Self-pay | Admitting: Family Medicine

## 2014-03-17 VITALS — BP 100/62 | HR 68 | Temp 98.2°F | Resp 16 | Wt 117.1 lb

## 2014-03-17 DIAGNOSIS — H538 Other visual disturbances: Secondary | ICD-10-CM

## 2014-03-17 DIAGNOSIS — G43819 Other migraine, intractable, without status migrainosus: Secondary | ICD-10-CM

## 2014-03-17 DIAGNOSIS — R51 Headache: Secondary | ICD-10-CM | POA: Insufficient documentation

## 2014-03-17 DIAGNOSIS — R7309 Other abnormal glucose: Secondary | ICD-10-CM

## 2014-03-17 DIAGNOSIS — R42 Dizziness and giddiness: Secondary | ICD-10-CM | POA: Insufficient documentation

## 2014-03-17 DIAGNOSIS — R200 Anesthesia of skin: Secondary | ICD-10-CM | POA: Diagnosis not present

## 2014-03-17 DIAGNOSIS — R208 Other disturbances of skin sensation: Secondary | ICD-10-CM

## 2014-03-17 LAB — GLUCOSE, POCT (MANUAL RESULT ENTRY): POC GLUCOSE: 170 mg/dL — AB (ref 70–99)

## 2014-03-17 MED ORDER — RIZATRIPTAN BENZOATE 10 MG PO TBDP: 10.0000 mg | ORAL_TABLET | ORAL | Status: DC | PRN

## 2014-03-17 MED ORDER — CYCLOBENZAPRINE HCL 10 MG PO TABS: 10.0000 mg | ORAL_TABLET | Freq: Three times a day (TID) | ORAL | Status: DC | PRN

## 2014-03-17 NOTE — Progress Notes (Signed)
Pre visit review using our clinic review tool, if applicable. No additional management support is needed unless otherwise documented below in the visit note. 

## 2014-03-17 NOTE — Progress Notes (Signed)
   Subjective:    Patient ID: Caitlin Duarte, female    DOB: January 21, 1986, 29 y.o.   MRN: [id number]  HPI Migraines- increase frequency x6 weeks.  'just not feeling good'.  Migraines are taking longer to recover from.  Occuring multiple times a week.  Having facial numbness.  Painful to look peripherally w/ some visual changes.  Facial numbness and visual changes are occuring independently from HA.  Has had 2 episodes of facial swelling- R side of lip and under R eye.  Most HAs are R sided.  Having jaw pain.  Having dizziness and light headedness independent of HA.  No new meds.  Eating regularly.  Will take Excedrin migraine as needed.  Has hx of triptan use but not recently.  + stress.   Review of Systems For ROS see HPI     Objective:   Physical Exam  Constitutional: She is oriented to person, place, and time. She appears well-developed and well-nourished. No distress.  HENT:  Head: Normocephalic and atraumatic.  TMs WNL No TTP over sinuses Minimal nasal congestion  Eyes: Conjunctivae and EOM are normal. Pupils are equal, round, and reactive to light.  Neck: Normal range of motion. Neck supple.  Cardiovascular: Normal rate, regular rhythm, normal heart sounds and intact distal pulses.   Pulmonary/Chest: Effort normal and breath sounds normal. No respiratory distress. She has no wheezes. She has no rales.  Lymphadenopathy:    She has no cervical adenopathy.  Neurological: She is alert and oriented to person, place, and time. She has normal reflexes. No cranial nerve deficit. Coordination normal.  Skin: Skin is warm and dry. No rash noted.  Psychiatric: She has a normal mood and affect. Her behavior is normal. Judgment and thought content normal.  Vitals reviewed.         Assessment & Plan:

## 2014-03-17 NOTE — Patient Instructions (Signed)
Follow up as needed We'll notify you of your lab results and CT results At first sign of headache, take the excedrin migraine.  If no improvement in 20 minutes, take the Maxalt Use the flexeril at night for neck spasm Drink plenty of fluids REST! Call with any questions or concerns Hang in there!!!

## 2014-03-18 ENCOUNTER — Telehealth: Payer: Self-pay | Admitting: Family Medicine

## 2014-03-18 LAB — CBC WITH DIFFERENTIAL/PLATELET
BASOS ABS: 0 10*3/uL (ref 0.0–0.1)
Basophils Relative: 0.2 % (ref 0.0–3.0)
Eosinophils Absolute: 0.1 10*3/uL (ref 0.0–0.7)
Eosinophils Relative: 1 % (ref 0.0–5.0)
HEMATOCRIT: 41.9 % (ref 36.0–46.0)
Hemoglobin: 14 g/dL (ref 12.0–15.0)
LYMPHS ABS: 2.9 10*3/uL (ref 0.7–4.0)
Lymphocytes Relative: 28.9 % (ref 12.0–46.0)
MCHC: 33.4 g/dL (ref 30.0–36.0)
MCV: 90.8 fl (ref 78.0–100.0)
MONO ABS: 0.7 10*3/uL (ref 0.1–1.0)
Monocytes Relative: 6.7 % (ref 3.0–12.0)
NEUTROS PCT: 63.2 % (ref 43.0–77.0)
Neutro Abs: 6.3 10*3/uL (ref 1.4–7.7)
PLATELETS: 389 10*3/uL (ref 150.0–400.0)
RBC: 4.62 Mil/uL (ref 3.87–5.11)
RDW: 12.8 % (ref 11.5–15.5)
WBC: 9.9 10*3/uL (ref 4.0–10.5)

## 2014-03-18 LAB — HCG, QUANTITATIVE, PREGNANCY: Quantitative HCG: 0.11 m[IU]/mL

## 2014-03-18 LAB — SEDIMENTATION RATE: SED RATE: 3 mm/h (ref 0–22)

## 2014-03-18 LAB — HEMOGLOBIN A1C: HEMOGLOBIN A1C: 5.5 % (ref 4.6–6.5)

## 2014-03-18 LAB — BASIC METABOLIC PANEL
BUN: 12 mg/dL (ref 6–23)
CO2: 25 mEq/L (ref 19–32)
Calcium: 9.7 mg/dL (ref 8.4–10.5)
Chloride: 106 mEq/L (ref 96–112)
Creatinine, Ser: 0.5 mg/dL (ref 0.4–1.2)
GFR: 161.11 mL/min (ref >60.00)
Glucose, Bld: 103 mg/dL — ABNORMAL HIGH (ref 70–99)
POTASSIUM: 3.9 meq/L (ref 3.5–5.1)
SODIUM: 138 meq/L (ref 135–145)

## 2014-03-18 LAB — PROLACTIN: PROLACTIN: 22.3 ng/mL

## 2014-03-18 LAB — TSH: TSH: 1.45 u[IU]/mL (ref 0.35–4.50)

## 2014-03-18 NOTE — Telephone Encounter (Signed)
i believe this is the wrong pt.Marland KitchenMarland Kitchen

## 2014-03-18 NOTE — Telephone Encounter (Signed)
Caller name: brian Relation to pt: husband Call back number: [phone] Pharmacy:  Reason for call:   Patient husband wants to know results for patient.

## 2014-03-18 NOTE — Telephone Encounter (Signed)
Called and left a message for pt husband notifying all labs were normal and sent to [facility] for pt.

## 2014-03-18 NOTE — Telephone Encounter (Signed)
Please advise, we sent in generics. Pt just needs to contact formulary correct?

## 2014-03-21 NOTE — Assessment & Plan Note (Signed)
New.  May be related to pt's worsening migraines but must r/o other causes of blurry vision- elevated sugar/diabetes, pituitary mass pushing on optic nerve, thyroid abnormality.  Will get head CT and labs to determine if there is underlying cause.  Pt expressed understanding and is in agreement w/ plan.

## 2014-03-21 NOTE — Assessment & Plan Note (Signed)
New.  May be related to pt's worsening migraines but must r/o other possible causes.  Check labs, get CT head.  If no improvement w/ tx of migraine, will need neuro referral.  Pt expressed understanding and is in agreement w/ plan.

## 2014-03-21 NOTE — Assessment & Plan Note (Signed)
Deteriorated.  Start triptan prn.  Flexeril for trap spasm.  Due to worsening and more frequent HAs, will get CT.  Pt expressed understanding and is in agreement w/ plan.

## 2014-03-29 ENCOUNTER — Ambulatory Visit: Payer: BC Managed Care – PPO | Admitting: Family Medicine

## 2014-04-30 ENCOUNTER — Telehealth: Payer: Self-pay | Admitting: Nurse Practitioner

## 2014-04-30 ENCOUNTER — Emergency Department (HOSPITAL_BASED_OUTPATIENT_CLINIC_OR_DEPARTMENT_OTHER): Payer: Worker's Compensation

## 2014-04-30 ENCOUNTER — Encounter (HOSPITAL_BASED_OUTPATIENT_CLINIC_OR_DEPARTMENT_OTHER): Payer: Self-pay

## 2014-04-30 ENCOUNTER — Emergency Department (HOSPITAL_BASED_OUTPATIENT_CLINIC_OR_DEPARTMENT_OTHER)
Admission: EM | Admit: 2014-04-30 | Discharge: 2014-04-30 | Disposition: A | Discharge status: Home / Self Care | Payer: Worker's Compensation | Attending: Emergency Medicine | Admitting: Emergency Medicine

## 2014-04-30 DIAGNOSIS — S6391XA Sprain of unspecified part of right wrist and hand, initial encounter: Secondary | ICD-10-CM | POA: Diagnosis not present

## 2014-04-30 DIAGNOSIS — Y998 Other external cause status: Secondary | ICD-10-CM | POA: Insufficient documentation

## 2014-04-30 DIAGNOSIS — Y9289 Other specified places as the place of occurrence of the external cause: Secondary | ICD-10-CM | POA: Diagnosis not present

## 2014-04-30 DIAGNOSIS — S6991XA Unspecified injury of right wrist, hand and finger(s), initial encounter: Secondary | ICD-10-CM | POA: Diagnosis present

## 2014-04-30 DIAGNOSIS — W502XXA Accidental twist by another person, initial encounter: Secondary | ICD-10-CM | POA: Diagnosis not present

## 2014-04-30 DIAGNOSIS — Y9389 Activity, other specified: Secondary | ICD-10-CM | POA: Insufficient documentation

## 2014-04-30 DIAGNOSIS — Z79899 Other long term (current) drug therapy: Secondary | ICD-10-CM | POA: Diagnosis not present

## 2014-04-30 DIAGNOSIS — S6990XA Unspecified injury of unspecified wrist, hand and finger(s), initial encounter: Secondary | ICD-10-CM

## 2014-04-30 DIAGNOSIS — G43909 Migraine, unspecified, not intractable, without status migrainosus: Secondary | ICD-10-CM | POA: Insufficient documentation

## 2014-04-30 NOTE — Telephone Encounter (Signed)
Spoke with patient. Patient has taken 3 OTC pregnancy tests that have been positive. LMP 1/21. Advised patient will need to schedule appointment with Milford Cage, FNP for confirmation. Patient is agreeable. Appointment scheduled for 2/23 at 3:30pm with Milford Cage, Archie. Patient is agreeable to date and time.  Routing to provider for final review. Patient agreeable to disposition. Will close encounter

## 2014-04-30 NOTE — Discharge Instructions (Signed)
Rest.  Ice for 20 minutes every 2 hours while awake for the next 2 days.  Ibuprofen 400 mg every 6 hours as needed for pain.  Return to the emergency department if you experience any new and additional problems.   Sprain A sprain is a tear in one of the strong, fibrous tissues that connect your bones (ligaments). The severity of the sprain depends on how much of the ligament is torn. The tear can be either partial or complete. CAUSES  Often, sprains are a result of a fall or an injury. The force of the impact causes the fibers of your ligament to stretch beyond their normal length. This excess tension causes the fibers of your ligament to tear. SYMPTOMS  You may have some loss of motion or increased pain within your normal range of motion. Other symptoms include:  Bruising.  Tenderness.  Swelling. DIAGNOSIS  In order to diagnose a sprain, your caregiver will physically examine you to determine how torn the ligament is. Your caregiver may also suggest an X-ray exam to make sure no bones are broken. TREATMENT  If your ligament is only partially torn, treatment usually involves keeping the injured area in a fixed position (immobilization) for a short period. To do this, your caregiver will apply a bandage, cast, or splint to keep the area from moving until it heals. For a partially torn ligament, the healing process usually takes 2 to 3 weeks. If your ligament is completely torn, you may need surgery to reconnect the ligament to the bone or to reconstruct the ligament. After surgery, a cast or splint may be applied and will need to stay on for 4 to 6 weeks while your ligament heals. HOME CARE INSTRUCTIONS  Keep the injured area elevated to decrease swelling.  To ease pain and swelling, apply ice to your joint twice a day, for 2 to 3 days.  Put ice in a plastic bag.  Place a towel between your skin and the bag.  Leave the ice on for 15 minutes.  Only take over-the-counter or  prescription medicine for pain as directed by your caregiver.  Do not leave the injured area unprotected until pain and stiffness go away (usually 3 to 4 weeks).  Do not allow your cast or splint to get wet. Cover your cast or splint with a plastic bag when you shower or bathe. Do not swim.  Your caregiver may suggest exercises for you to do during your recovery to prevent or limit permanent stiffness. SEEK IMMEDIATE MEDICAL CARE IF:  Your cast or splint becomes damaged.  Your pain becomes worse. MAKE SURE YOU:  Understand these instructions.  Will watch your condition.  Will get help right away if you are not doing well or get worse. Document Released: 02/24/2000 Document Revised: 05/21/2011 Document Reviewed: 03/10/2011 [facility] Patient Information 2015 [place], [place]. This information is not intended to replace advice given to you by your health care provider. Make sure you discuss any questions you have with your health care provider.

## 2014-04-30 NOTE — Telephone Encounter (Signed)
Patient had a few positive OTC pregnancy test. Patient would like an appointment ASAP. Last seen 10/27/2013.
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
## 2014-05-03 ENCOUNTER — Encounter (HOSPITAL_BASED_OUTPATIENT_CLINIC_OR_DEPARTMENT_OTHER): Payer: Self-pay | Admitting: *Deleted

## 2014-05-03 ENCOUNTER — Emergency Department (HOSPITAL_BASED_OUTPATIENT_CLINIC_OR_DEPARTMENT_OTHER)
Admission: EM | Admit: 2014-05-03 | Discharge: 2014-05-03 | Disposition: A | Discharge status: Home / Self Care | Payer: Worker's Compensation | Attending: Emergency Medicine | Admitting: Emergency Medicine

## 2014-05-03 DIAGNOSIS — Z79899 Other long term (current) drug therapy: Secondary | ICD-10-CM | POA: Diagnosis not present

## 2014-05-03 DIAGNOSIS — G43909 Migraine, unspecified, not intractable, without status migrainosus: Secondary | ICD-10-CM | POA: Diagnosis not present

## 2014-05-03 DIAGNOSIS — S6391XD Sprain of unspecified part of right wrist and hand, subsequent encounter: Secondary | ICD-10-CM

## 2014-05-03 DIAGNOSIS — X58XXXD Exposure to other specified factors, subsequent encounter: Secondary | ICD-10-CM | POA: Diagnosis not present

## 2014-05-03 DIAGNOSIS — S6991XD Unspecified injury of right wrist, hand and finger(s), subsequent encounter: Secondary | ICD-10-CM | POA: Diagnosis present

## 2014-05-03 NOTE — ED Notes (Addendum)
Pt seen here x 3 days ago for same, WC right hand pain  Preg 5 weeks

## 2014-05-03 NOTE — ED Notes (Signed)
MD at bedside. 
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
## 2014-05-03 NOTE — Discharge Instructions (Signed)
Finger Sprain  A finger sprain is a tear in one of the strong, fibrous tissues that connect the bones (ligaments) in your finger. The severity of the sprain depends on how much of the ligament is torn. The tear can be either partial or complete.  CAUSES   Often, sprains are a result of a fall or accident. If you extend your hands to catch an object or to protect yourself, the force of the impact causes the fibers of your ligament to stretch too much. This excess tension causes the fibers of your ligament to tear.  SYMPTOMS   You may have some loss of motion in your finger. Other symptoms include:   Bruising.   Tenderness.   Swelling.  DIAGNOSIS   In order to diagnose finger sprain, your caregiver will physically examine your finger or thumb to determine how torn the ligament is. Your caregiver may also suggest an X-ray exam of your finger to make sure no bones are broken.  TREATMENT   If your ligament is only partially torn, treatment usually involves keeping the finger in a fixed position (immobilization) for a short period. To do this, your caregiver will apply a bandage, cast, or splint to keep your finger from moving until it heals. For a partially torn ligament, the healing process usually takes 2 to 3 weeks.  If your ligament is completely torn, you may need surgery to reconnect the ligament to the bone. After surgery a cast or splint will be applied and will need to stay on your finger or thumb for 4 to 6 weeks while your ligament heals.  HOME CARE INSTRUCTIONS   Keep your injured finger elevated, when possible, to decrease swelling.   To ease pain and swelling, apply ice to your joint twice a day, for 2 to 3 days:   Put ice in a plastic bag.   Place a towel between your skin and the bag.   Leave the ice on for 15 minutes.   Only take over-the-counter or prescription medicine for pain as directed by your caregiver.   Do not wear rings on your injured finger.   Do not leave your finger unprotected  until pain and stiffness go away (usually 3 to 4 weeks).   Do not allow your cast or splint to get wet. Cover your cast or splint with a plastic bag when you shower or bathe. Do not swim.   Your caregiver may suggest special exercises for you to do during your recovery to prevent or limit permanent stiffness.  SEEK IMMEDIATE MEDICAL CARE IF:   Your cast or splint becomes damaged.   Your pain becomes worse rather than better.  MAKE SURE YOU:   Understand these instructions.   Will watch your condition.   Will get help right away if you are not doing well or get worse.  Document Released: 04/05/2004 Document Revised: 05/21/2011 Document Reviewed: 10/30/2010  [employer] Patient Information 2015 [employer]. This information is not intended to replace advice given to you by your health care provider. Make sure you discuss any questions you have with your health care provider.

## 2014-05-04 ENCOUNTER — Encounter: Payer: Self-pay | Admitting: Nurse Practitioner

## 2014-05-04 ENCOUNTER — Ambulatory Visit (INDEPENDENT_AMBULATORY_CARE_PROVIDER_SITE_OTHER): Payer: BC Managed Care – PPO | Admitting: Nurse Practitioner

## 2014-05-04 VITALS — BP 118/86 | HR 96 | Temp 98.2°F | Ht 65.5 in | Wt 116.0 lb

## 2014-05-04 DIAGNOSIS — N912 Amenorrhea, unspecified: Secondary | ICD-10-CM

## 2014-05-04 LAB — POCT URINE PREGNANCY: PREG TEST UR: POSITIVE

## 2014-05-04 NOTE — Progress Notes (Signed)
Encounter reviewed by Dr. Brook Silva.  

## 2014-05-04 NOTE — Patient Instructions (Signed)
First Trimester of Pregnancy The first trimester of pregnancy is from week 1 until the end of week 12 (months 1 through 3). A week after a sperm fertilizes an egg, the egg will implant on the wall of the uterus. This embryo will begin to develop into a baby. Genes from you and your partner are forming the baby. The female genes determine whether the baby is a boy or a girl. At 6-8 weeks, the eyes and face are formed, and the heartbeat can be seen on ultrasound. At the end of 12 weeks, all the baby's organs are formed.  Now that you are pregnant, you will want to do everything you can to have a healthy baby. Two of the most important things are to get good prenatal care and to follow your health care provider's instructions. Prenatal care is all the medical care you receive before the baby's birth. This care will help prevent, find, and treat any problems during the pregnancy and childbirth. BODY CHANGES Your body goes through many changes during pregnancy. The changes vary from woman to woman.   You may gain or lose a couple of pounds at first.  You may feel sick to your stomach (nauseous) and throw up (vomit). If the vomiting is uncontrollable, call your health care provider.  You may tire easily.  You may develop headaches that can be relieved by medicines approved by your health care provider.  You may urinate more often. Painful urination may mean you have a bladder infection.  You may develop heartburn as a result of your pregnancy.  You may develop constipation because certain hormones are causing the muscles that push waste through your intestines to slow down.  You may develop hemorrhoids or swollen, bulging veins (varicose veins).  Your breasts may begin to grow larger and become tender. Your nipples may stick out more, and the tissue that surrounds them (areola) may become darker.  Your gums may bleed and may be sensitive to brushing and flossing.  Dark spots or blotches (chloasma,  mask of pregnancy) may develop on your face. This will likely fade after the baby is born.  Your menstrual periods will stop.  You may have a loss of appetite.  You may develop cravings for certain kinds of food.  You may have changes in your emotions from day to day, such as being excited to be pregnant or being concerned that something may go wrong with the pregnancy and baby.  You may have more vivid and strange dreams.  You may have changes in your hair. These can include thickening of your hair, rapid growth, and changes in texture. Some women also have hair loss during or after pregnancy, or hair that feels dry or thin. Your hair will most likely return to normal after your baby is born. WHAT TO EXPECT AT YOUR PRENATAL VISITS During a routine prenatal visit:  You will be weighed to make sure you and the baby are growing normally.  Your blood pressure will be taken.  Your abdomen will be measured to track your baby's growth.  The fetal heartbeat will be listened to starting around week 10 or 12 of your pregnancy.  Test results from any previous visits will be discussed. Your health care provider may ask you:  How you are feeling.  If you are feeling the baby move.  If you have had any abnormal symptoms, such as leaking fluid, bleeding, severe headaches, or abdominal cramping.  If you have any questions. Other tests   that may be performed during your first trimester include:  Blood tests to find your blood type and to check for the presence of any previous infections. They will also be used to check for low iron levels (anemia) and Rh antibodies. Later in the pregnancy, blood tests for diabetes will be done along with other tests if problems develop.  Urine tests to check for infections, diabetes, or protein in the urine.  An ultrasound to confirm the proper growth and development of the baby.  An amniocentesis to check for possible genetic problems.  Fetal screens for  spina bifida and Down syndrome.  You may need other tests to make sure you and the baby are doing well. HOME CARE INSTRUCTIONS  Medicines  Follow your health care provider's instructions regarding medicine use. Specific medicines may be either safe or unsafe to take during pregnancy.  Take your prenatal vitamins as directed.  If you develop constipation, try taking a stool softener if your health care provider approves. Diet  Eat regular, well-balanced meals. Choose a variety of foods, such as meat or vegetable-based protein, fish, milk and low-fat dairy products, vegetables, fruits, and whole grain breads and cereals. Your health care provider will help you determine the amount of weight gain that is right for you.  Avoid raw meat and uncooked cheese. These carry germs that can cause birth defects in the baby.  Eating four or five small meals rather than three large meals a day may help relieve nausea and vomiting. If you start to feel nauseous, eating a few soda crackers can be helpful. Drinking liquids between meals instead of during meals also seems to help nausea and vomiting.  If you develop constipation, eat more high-fiber foods, such as fresh vegetables or fruit and whole grains. Drink enough fluids to keep your urine clear or pale yellow. Activity and Exercise  Exercise only as directed by your health care provider. Exercising will help you:  Control your weight.  Stay in shape.  Be prepared for labor and delivery.  Experiencing pain or cramping in the lower abdomen or low back is a good sign that you should stop exercising. Check with your health care provider before continuing normal exercises.  Try to avoid standing for long periods of time. Move your legs often if you must stand in one place for a long time.  Avoid heavy lifting.  Wear low-heeled shoes, and practice good posture.  You may continue to have sex unless your health care provider directs you  otherwise. Relief of Pain or Discomfort  Wear a good support bra for breast tenderness.   Take warm sitz baths to soothe any pain or discomfort caused by hemorrhoids. Use hemorrhoid cream if your health care provider approves.   Rest with your legs elevated if you have leg cramps or low back pain.  If you develop varicose veins in your legs, wear support hose. Elevate your feet for 15 minutes, 3-4 times a day. Limit salt in your diet. Prenatal Care  Schedule your prenatal visits by the twelfth week of pregnancy. They are usually scheduled monthly at first, then more often in the last 2 months before delivery.  Write down your questions. Take them to your prenatal visits.  Keep all your prenatal visits as directed by your health care provider. Safety  Wear your seat belt at all times when driving.  Make a list of emergency phone numbers, including numbers for family, friends, the hospital, and police and fire departments. General Tips    Ask your health care provider for a referral to a local prenatal education class. Begin classes no later than at the beginning of month 6 of your pregnancy.  Ask for help if you have counseling or nutritional needs during pregnancy. Your health care provider can offer advice or refer you to specialists for help with various needs.  Do not use hot tubs, steam rooms, or saunas.  Do not douche or use tampons or scented sanitary pads.  Do not cross your legs for long periods of time.  Avoid cat litter boxes and soil used by cats. These carry germs that can cause birth defects in the baby and possibly loss of the fetus by miscarriage or stillbirth.  Avoid all smoking, herbs, alcohol, and medicines not prescribed by your health care provider. Chemicals in these affect the formation and growth of the baby.  Schedule a dentist appointment. At home, brush your teeth with a soft toothbrush and be gentle when you floss. SEEK MEDICAL CARE IF:   You have  dizziness.  You have mild pelvic cramps, pelvic pressure, or nagging pain in the abdominal area.  You have persistent nausea, vomiting, or diarrhea.  You have a bad smelling vaginal discharge.  You have pain with urination.  You notice increased swelling in your face, hands, legs, or ankles. SEEK IMMEDIATE MEDICAL CARE IF:   You have a fever.  You are leaking fluid from your vagina.  You have spotting or bleeding from your vagina.  You have severe abdominal cramping or pain.  You have rapid weight gain or loss.  You vomit blood or material that looks like coffee grounds.  You are exposed to German measles and have never had them.  You are exposed to fifth disease or chickenpox.  You develop a severe headache.  You have shortness of breath.  You have any kind of trauma, such as from a fall or a car accident. Document Released: 02/20/2001 Document Revised: 07/13/2013 Document Reviewed: 01/06/2013 [employer] Patient Information 2015 [employer]. This information is not intended to replace advice given to you by your health care provider. Make sure you discuss any questions you have with your health care provider.  
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
## 2014-05-06 ENCOUNTER — Telehealth: Payer: Self-pay | Admitting: Obstetrics and Gynecology

## 2014-05-06 DIAGNOSIS — Z3201 Encounter for pregnancy test, result positive: Secondary | ICD-10-CM

## 2014-05-06 DIAGNOSIS — N912 Amenorrhea, unspecified: Secondary | ICD-10-CM

## 2014-05-06 NOTE — Telephone Encounter (Signed)
Spoke with patient. Advised of benefit quote receive for PUS. Patient agreeable.  Scheduled PUS. Advised patient of 72 hour cancellation policy and $505 cancellation fee. Patient agreeable.

## 2014-05-06 NOTE — Addendum Note (Signed)
Addended by: Michele Mcalpine on: 05/06/2014 02:28 PM   Modules accepted: Orders

## 2014-05-18 ENCOUNTER — Telehealth: Payer: Self-pay | Admitting: Certified Nurse Midwife

## 2014-05-18 NOTE — Telephone Encounter (Signed)
Attempted to reach patient at number provided [phone]. There was no answer and recording states that the voicemail box is currently full. Will try again later.

## 2014-05-18 NOTE — Telephone Encounter (Signed)
Patient calling requesting an Rx for "morning sickness." Pharmacy on file is correct.

## 2014-05-18 NOTE — Telephone Encounter (Signed)
Attempted to reach patient at number provided [phone]. There was no answer and recording states that the voicemail box is currently full. Will try again later.

## 2014-05-19 NOTE — Telephone Encounter (Signed)
Left message to call Caitlin Duarte at [phone]. 

## 2014-05-25 NOTE — Telephone Encounter (Signed)
yes

## 2014-05-25 NOTE — Telephone Encounter (Signed)
Regina Eck CNM, I have attempted to reach this patient x3 with no return call. Okay to close encounter?

## 2014-05-26 ENCOUNTER — Telehealth: Payer: Self-pay

## 2014-05-26 NOTE — Telephone Encounter (Signed)
Spoke with patient at time of incoming call. Patient is returning call from 05/18/2014 regarding morning sickness. Please see encounter. Patient states "I am actually feeling a lot better and do no think I need anything right now." Patient is scheduled for viability ultrasound tomorrow at 8am with 8:30am consult with Dr.Silva. Patient states she would like to discuss nausea with Dr.Silva at this appointment. "Just in case it happens again. I want to let her know."  Routing to provider for final review. Patient agreeable to disposition. Will close encounter

## 2014-05-27 ENCOUNTER — Ambulatory Visit (INDEPENDENT_AMBULATORY_CARE_PROVIDER_SITE_OTHER): Payer: BC Managed Care – PPO | Admitting: Obstetrics and Gynecology

## 2014-05-27 ENCOUNTER — Ambulatory Visit (INDEPENDENT_AMBULATORY_CARE_PROVIDER_SITE_OTHER): Payer: BC Managed Care – PPO

## 2014-05-27 ENCOUNTER — Encounter: Payer: Self-pay | Admitting: Obstetrics and Gynecology

## 2014-05-27 ENCOUNTER — Other Ambulatory Visit: Payer: Self-pay | Admitting: Obstetrics and Gynecology

## 2014-05-27 VITALS — BP 108/72 | Resp 18 | Ht 65.5 in | Wt 117.0 lb

## 2014-05-27 DIAGNOSIS — Z3201 Encounter for pregnancy test, result positive: Secondary | ICD-10-CM

## 2014-05-27 DIAGNOSIS — N912 Amenorrhea, unspecified: Secondary | ICD-10-CM

## 2014-05-27 DIAGNOSIS — Z3490 Encounter for supervision of normal pregnancy, unspecified, unspecified trimester: Secondary | ICD-10-CM

## 2014-05-27 LAB — US OB TRANSVAGINAL

## 2014-05-27 NOTE — Progress Notes (Signed)
Subjective   Patient is here today for viability ultrasound.  Husband and mother are present also.   LMP 05/02/14 - normal.  Having some am nausea.  Otherwise fine during the day.  Has stopped Albuterol, Flexeril, and Maxalt.  Has history of migraine headaches.   Objective  Ultrasound images and report reviewed with patient.  Viable IUP noted.  8+0 weeks.  Size equals dates.  FH 164.  Cervix normal.  Left CL cyst.     Assessment   Viable early pregnancy.  Size equals dates. Morning sickness.  Headaches.   Plan  Discussion of early prenatal care.  Avoidance of unnecessary medications. Told to use Albuterol if needed.  Avoid tobacco, ETOH, uncooked or unpasturized foods. PNV.  Discussed vitamin B6 25 mg and Unisom 25 mg, 1/2 tablet for nausea in early pregnancy.  Tylenol for headaches.  Other medications to be approved by [facility] team.  Discussed reading materials about pregnancy available at local book stores.  Establish care with OB office.  Already has one in mind.  Follow up here for routine care after postpartum visit.  Our congratulations!  15 minutes face to face time of which over 50% was spent in counseling.   After visit summary to patient.

## 2014-05-27 NOTE — Patient Instructions (Signed)
Please call your obstetric office to establish care.

## 2014-06-25 LAB — OB RESULTS CONSOLE ANTIBODY SCREEN: Antibody Screen: NEGATIVE

## 2014-06-25 LAB — OB RESULTS CONSOLE RUBELLA ANTIBODY, IGM: Rubella: IMMUNE

## 2014-06-25 LAB — OB RESULTS CONSOLE HEPATITIS B SURFACE ANTIGEN: Hepatitis B Surface Ag: NEGATIVE

## 2014-06-25 LAB — OB RESULTS CONSOLE ABO/RH: RH TYPE: POSITIVE

## 2014-06-25 LAB — OB RESULTS CONSOLE GC/CHLAMYDIA
Chlamydia: NEGATIVE
Gonorrhea: NEGATIVE

## 2014-06-25 LAB — OB RESULTS CONSOLE RPR: RPR: NONREACTIVE

## 2014-06-25 LAB — OB RESULTS CONSOLE HIV ANTIBODY (ROUTINE TESTING): HIV: NONREACTIVE

## 2014-08-09 ENCOUNTER — Inpatient Hospital Stay (HOSPITAL_COMMUNITY)
Admission: AD | Admit: 2014-08-09 | Discharge: 2014-08-09 | Disposition: A | Discharge status: Home / Self Care | Payer: BC Managed Care – PPO | Source: Ambulatory Visit | Attending: Obstetrics | Admitting: Obstetrics

## 2014-08-09 ENCOUNTER — Inpatient Hospital Stay (HOSPITAL_COMMUNITY): Payer: BC Managed Care – PPO

## 2014-08-09 ENCOUNTER — Encounter (HOSPITAL_COMMUNITY): Payer: Self-pay | Admitting: *Deleted

## 2014-08-09 DIAGNOSIS — Z3A18 18 weeks gestation of pregnancy: Secondary | ICD-10-CM | POA: Insufficient documentation

## 2014-08-09 DIAGNOSIS — O209 Hemorrhage in early pregnancy, unspecified: Secondary | ICD-10-CM | POA: Diagnosis not present

## 2014-08-09 DIAGNOSIS — Z833 Family history of diabetes mellitus: Secondary | ICD-10-CM | POA: Insufficient documentation

## 2014-08-09 DIAGNOSIS — O4692 Antepartum hemorrhage, unspecified, second trimester: Secondary | ICD-10-CM | POA: Diagnosis not present

## 2014-08-09 DIAGNOSIS — R823 Hemoglobinuria: Secondary | ICD-10-CM | POA: Insufficient documentation

## 2014-08-09 DIAGNOSIS — Z8249 Family history of ischemic heart disease and other diseases of the circulatory system: Secondary | ICD-10-CM | POA: Diagnosis not present

## 2014-08-09 DIAGNOSIS — N939 Abnormal uterine and vaginal bleeding, unspecified: Secondary | ICD-10-CM | POA: Diagnosis present

## 2014-08-09 DIAGNOSIS — O9989 Other specified diseases and conditions complicating pregnancy, childbirth and the puerperium: Secondary | ICD-10-CM | POA: Insufficient documentation

## 2014-08-09 DIAGNOSIS — R109 Unspecified abdominal pain: Secondary | ICD-10-CM | POA: Insufficient documentation

## 2014-08-09 LAB — URINALYSIS, ROUTINE W REFLEX MICROSCOPIC
Bilirubin Urine: NEGATIVE
Glucose, UA: NEGATIVE mg/dL
Ketones, ur: 15 mg/dL — AB
NITRITE: NEGATIVE
PH: 6 (ref 5.0–8.0)
Protein, ur: 30 mg/dL — AB
Specific Gravity, Urine: 1.025 (ref 1.005–1.030)
Urobilinogen, UA: 0.2 mg/dL (ref 0.0–1.0)

## 2014-08-09 LAB — URINE MICROSCOPIC-ADD ON

## 2014-08-09 NOTE — Discharge Instructions (Signed)
Vaginal Bleeding During Pregnancy, Second Trimester A small amount of bleeding (spotting) from the vagina is relatively common in pregnancy. It usually stops on its own. Various things can cause bleeding or spotting in pregnancy. Some bleeding may be related to the pregnancy, and some may not. Sometimes the bleeding is normal and is not a problem. However, bleeding can also be a sign of something serious. Be sure to tell your health care provider about any vaginal bleeding right away. Some possible causes of vaginal bleeding during the second trimester include:  Infection, inflammation, or growths on the cervix.   The placenta may be partially or completely covering the opening of the cervix inside the uterus (placenta previa).  The placenta may have separated from the uterus (abruption of the placenta).   You may be having early (preterm) labor.   The cervix may not be strong enough to keep a baby inside the uterus (cervical insufficiency).   Tiny cysts may have developed in the uterus instead of pregnancy tissue (molar pregnancy). HOME CARE INSTRUCTIONS  Watch your condition for any changes. The following actions may help to lessen any discomfort you are feeling:  Follow your health care provider's instructions for limiting your activity. If your health care provider orders bed rest, you may need to stay in bed and only get up to use the bathroom. However, your health care provider may allow you to continue light activity.  If needed, make plans for someone to help with your regular activities and responsibilities while you are on bed rest.  Keep track of the number of pads you use each day, how often you change pads, and how soaked (saturated) they are. Write this down.  Do not use tampons. Do not douche.  Do not have sexual intercourse or orgasms until approved by your health care provider.  If you pass any tissue from your vagina, save the tissue so you can show it to your  health care provider.  Only take over-the-counter or prescription medicines as directed by your health care provider.  Do not take aspirin because it can make you bleed.  Do not exercise or perform any strenuous activities or heavy lifting without your health care provider's permission.  Keep all follow-up appointments as directed by your health care provider. SEEK MEDICAL CARE IF:  You have any vaginal bleeding during any part of your pregnancy.  You have cramps or labor pains.  You have a fever, not controlled by medicine. SEEK IMMEDIATE MEDICAL CARE IF:   You have severe cramps in your back or belly (abdomen).  You have contractions.  You have chills.  You pass large clots or tissue from your vagina.  Your bleeding increases.  You feel light-headed or weak, or you have fainting episodes.  You are leaking fluid or have a gush of fluid from your vagina. MAKE SURE YOU:  Understand these instructions.  Will watch your condition.  Will get help right away if you are not doing well or get worse. Document Released: 12/06/2004 Document Revised: 03/03/2013 Document Reviewed: 11/03/2012 [facility] Patient Information 2015 [place], [place]. This information is not intended to replace advice given to you by your health care provider. Make sure you discuss any questions you have with your health care provider.

## 2014-08-09 NOTE — MAU Note (Signed)
Pt had intercourse this a.m., pt had BM & noticed blood @ 1530 - had a small clot.  Pt also noted bleeding again later.  Pt denies pain, states she is very nervous & scared about the bleeding.

## 2014-08-09 NOTE — MAU Provider Note (Signed)
History     CSN: [id number]  Arrival date and time: 08/09/14 1650   First Provider Initiated Contact with Patient 08/09/14 1715      Chief Complaint  Patient presents with  . Vaginal Bleeding   HPI Caitlin Duarte 29 y.o. G1P0 @[redacted]w[redacted]d  presents to MAU with vaginal bleeding.  She had IC at 8am today.  No vaginal bleeding noted until 3pm.  She has been wearing a panty liner but not soaking pads.  She noted one clot that was smaller than a dime.  It is bright red blood.  She has mild abdominal pain and denies fever, weakness, dysuria, LOF, headache.  OB History    Gravida Para Term Preterm AB TAB SAB Ectopic Multiple Living   1               Past Medical History  Diagnosis Date  . Migraines     Past Surgical History  Procedure Laterality Date  . Eye surgery    . Nose surgery  2006  . Appendectomy    . Wisdom tooth extraction  2006  . Sinus surgery with instatrak      Family History  Problem Relation Age of Onset  . Anemia Sister   . Thyroid disease Sister   . Diabetes Paternal Grandmother   . Hypertension Paternal Grandmother   . Diabetes Paternal Grandfather   . Hypertension Paternal Grandfather   . Alzheimer's disease Maternal Grandfather   . Hypertension Father   . Diabetes Father   . Stroke Maternal Grandmother     History  Substance Use Topics  . Smoking status: Never Smoker   . Smokeless tobacco: Never Used  . Alcohol Use: Yes     Comment: socially    Allergies:  Allergies  Allergen Reactions  . Other Anaphylaxis and Shortness Of Breath    curry    Prescriptions prior to admission  Medication Sig Dispense Refill Last Dose  . albuterol (PROAIR HFA) 108 (90 BASE) MCG/ACT inhaler Inhale 2 puffs into the lungs every 4 (four) hours as needed for wheezing or shortness of breath. (Patient not taking: Reported on 05/27/2014) 1 Inhaler 6 Not Taking  . cyclobenzaprine (FLEXERIL) 10 MG tablet Take 1 tablet (10 mg total) by mouth 3 (three) times daily as needed  for muscle spasms. (Patient not taking: Reported on 05/04/2014) 30 tablet 0 Not Taking  . Prenatal MV-Min-Fe Fum-FA-DHA (PRENATAL 1) 30-0.975-200 MG CAPS Take 1 tablet by mouth daily.   Taking  . rizatriptan (MAXALT-MLT) 10 MG disintegrating tablet Take 1 tablet (10 mg total) by mouth as needed for migraine. May repeat in 2 hours if needed (Patient not taking: Reported on 05/04/2014) 10 tablet 6 Not Taking    ROS Pertinent ROS in HPI.  All other systems are negative.   Physical Exam   Blood pressure 121/85, pulse 95, temperature 98.2 F (36.8 C), temperature source Oral, resp. rate 20, height 5\' 4"  (1.626 m), weight 123 lb (55.792 kg), last menstrual period 04/01/2014.  Physical Exam  Constitutional: She is oriented to person, place, and time. She appears well-developed and well-nourished. No distress.  HENT:  Head: Normocephalic and atraumatic.  Eyes: EOM are normal.  Neck: Normal range of motion.  Cardiovascular: Normal rate and regular rhythm.   Respiratory: Effort normal and breath sounds normal. No respiratory distress.  GI: Soft. Bowel sounds are normal. She exhibits no distension. There is no tenderness.  Musculoskeletal: Normal range of motion.  Neurological: She is alert and oriented to  person, place, and time.  Skin: Skin is warm and dry.  Psychiatric: She has a normal mood and affect.    MAU Course  Procedures  MDM Discussed with Dr. Carlis Abbott.  Will order U/S and if normal do vaginal exam.   U/S shows no evidence of previa.  No problems identified.  Vaginal exam done.  No bleeding present in vagina: white thin liquid only.  Cervix is closed and thick.  Dr. Carlis Abbott aware.  No further workup reqd.  Okay for discharge.  Late results of U/A:  Results for orders placed or performed during the hospital encounter of 08/09/14 (from the past 24 hour(s))  Urinalysis, Routine w reflex microscopic (not at [facility])     Status: Abnormal   Collection Time: 08/09/14  5:00 PM  Result Value  Ref Range   Color, Urine RED (A) YELLOW   APPearance CLOUDY (A) CLEAR   Specific Gravity, Urine 1.025 1.005 - 1.030   pH 6.0 5.0 - 8.0   Glucose, UA NEGATIVE NEGATIVE mg/dL   Hgb urine dipstick LARGE (A) NEGATIVE   Bilirubin Urine NEGATIVE NEGATIVE   Ketones, ur 15 (A) NEGATIVE mg/dL   Protein, ur 30 (A) NEGATIVE mg/dL   Urobilinogen, UA 0.2 0.0 - 1.0 mg/dL   Nitrite NEGATIVE NEGATIVE   Leukocytes, UA MODERATE (A) NEGATIVE  Urine microscopic-add on     Status: Abnormal   Collection Time: 08/09/14  5:00 PM  Result Value Ref Range   Squamous Epithelial / LPF FEW (A) RARE   WBC, UA 3-6 <3 WBC/hpf   RBC / HPF TOO NUMEROUS TO COUNT <3 RBC/hpf   Dr. Carlis Abbott made aware.  No change in treatment at this time.  Will await urine culture.    Assessment and Plan  A:  1. Vaginal bleeding in pregnancy, second trimester   2.      Hemoglobinuria.   P: Discharge to home Followup in clinic as needed/as scheduled Increase po fluids Urine culture pending Patient may return to MAU as needed or if her condition were to change or worsen   Paticia Stack 08/09/2014, 5:21 PM

## 2014-08-11 LAB — CULTURE, OB URINE: Colony Count: 6000

## 2014-11-01 ENCOUNTER — Ambulatory Visit: Payer: BC Managed Care – PPO | Admitting: Nurse Practitioner

## 2014-11-30 ENCOUNTER — Inpatient Hospital Stay (HOSPITAL_COMMUNITY)
Admission: AD | Admit: 2014-11-30 | Discharge: 2014-11-30 | Disposition: A | Discharge status: Home / Self Care | Payer: BLUE CROSS/BLUE SHIELD | Source: Ambulatory Visit | Attending: Obstetrics and Gynecology | Admitting: Obstetrics and Gynecology

## 2014-11-30 ENCOUNTER — Encounter (HOSPITAL_COMMUNITY): Payer: Self-pay | Admitting: *Deleted

## 2014-11-30 ENCOUNTER — Inpatient Hospital Stay (HOSPITAL_COMMUNITY): Payer: BLUE CROSS/BLUE SHIELD

## 2014-11-30 DIAGNOSIS — N898 Other specified noninflammatory disorders of vagina: Secondary | ICD-10-CM | POA: Insufficient documentation

## 2014-11-30 DIAGNOSIS — R109 Unspecified abdominal pain: Secondary | ICD-10-CM | POA: Diagnosis not present

## 2014-11-30 DIAGNOSIS — Z3A34 34 weeks gestation of pregnancy: Secondary | ICD-10-CM | POA: Insufficient documentation

## 2014-11-30 DIAGNOSIS — O26893 Other specified pregnancy related conditions, third trimester: Secondary | ICD-10-CM | POA: Diagnosis not present

## 2014-11-30 DIAGNOSIS — O4290 Premature rupture of membranes, unspecified as to length of time between rupture and onset of labor, unspecified weeks of gestation: Secondary | ICD-10-CM

## 2014-11-30 DIAGNOSIS — O26899 Other specified pregnancy related conditions, unspecified trimester: Secondary | ICD-10-CM

## 2014-11-30 HISTORY — DX: Unspecified asthma, uncomplicated: J45.909

## 2014-11-30 LAB — URINALYSIS, ROUTINE W REFLEX MICROSCOPIC
BILIRUBIN URINE: NEGATIVE
Glucose, UA: NEGATIVE mg/dL
Hgb urine dipstick: NEGATIVE
KETONES UR: NEGATIVE mg/dL
Leukocytes, UA: NEGATIVE
Nitrite: NEGATIVE
PROTEIN: NEGATIVE mg/dL
Specific Gravity, Urine: <1.005 — ABNORMAL LOW (ref 1.005–1.030)
UROBILINOGEN UA: 0.2 mg/dL (ref 0.0–1.0)
pH: 6.5 (ref 5.0–8.0)

## 2014-11-30 NOTE — MAU Note (Signed)
Pt states she has been having some tightening in abd throughout the last few days.  Pt states she stood up and had wetness soak through her panties and dress.  Pt continues to soak two panty liners.  No vaginal bleeding or abnormal discharge.  Good fetal movement.

## 2014-11-30 NOTE — MAU Provider Note (Signed)
History     CSN: [id number]  Arrival date and time: 11/30/14 1729   First Provider Initiated Contact with Patient 11/30/14 1752      Chief Complaint  Patient presents with  . Rupture of Membranes   HPI pt is G1P0 at [redacted]w[redacted]d presents with possible ROM.  Pt states she had some leakage of fluid from her vagina Earlier today and soaked 2 pantiliners- none now.  Pt has had some abdominal tightening. Pt denies nausea or vomiting, or vaginal bleeding Pt has had good fetal movement. RN note: , Tree surgeon Signed  MAU Note 11/30/2014 5:48 PM    Expand All Collapse All   Pt states she has been having some tightening in abd throughout the last few days. Pt states she stood up and had wetness soak through her panties and dress. Pt continues to soak two panty liners. No vaginal bleeding or abnormal discharge. Good fetal movement.       Past Medical History  Diagnosis Date  . Migraines   . Asthma     Past Surgical History  Procedure Laterality Date  . Eye surgery    . Nose surgery  2006  . Appendectomy    . Wisdom tooth extraction  2006  . Sinus surgery with instatrak      Family History  Problem Relation Age of Onset  . Anemia Sister   . Thyroid disease Sister   . Diabetes Paternal Grandmother   . Hypertension Paternal Grandmother   . Diabetes Paternal Grandfather   . Hypertension Paternal Grandfather   . Alzheimer's disease Maternal Grandfather   . Hypertension Father   . Diabetes Father   . Stroke Maternal Grandmother     Social History  Substance Use Topics  . Smoking status: Never Smoker   . Smokeless tobacco: Never Used  . Alcohol Use: Yes     Comment: socially    Allergies:  Allergies  Allergen Reactions  . Other Anaphylaxis and Shortness Of Breath    curry    Prescriptions prior to admission  Medication Sig Dispense Refill Last Dose  . albuterol (PROAIR HFA) 108 (90 BASE) MCG/ACT inhaler Inhale 2 puffs into the lungs every 4 (four) hours as  needed for wheezing or shortness of breath. (Patient taking differently: Inhale 2 puffs into the lungs every 4 (four) hours as needed for wheezing or shortness of breath (emergency asthma medication). ) 1 Inhaler 6 Past Month at Unknown time  . EPINEPHrine (EPIPEN 2-PAK IJ) Inject 1 applicator as directed daily as needed (for allergic reaction).   Past Month at Unknown time  . Prenatal MV-Min-Fe Fum-FA-DHA (PRENATAL 1) 30-0.975-200 MG CAPS Take 1 tablet by mouth daily.   08/08/2014 at Unknown time    ROS Physical Exam   Blood pressure 112/71, pulse 85, temperature 98.2 F (36.8 C), temperature source Oral, resp. rate 18, last menstrual period 04/01/2014, SpO2 97 %.  Physical Exam  Nursing note and vitals reviewed. Constitutional: She is oriented to person, place, and time. She appears well-developed and well-nourished. No distress.  HENT:  Head: Normocephalic.  Eyes: Conjunctivae are normal. Pupils are equal, round, and reactive to light.  Neck: Normal range of motion.  Cardiovascular: Normal rate.   Respiratory: Effort normal.  GI: Soft. She exhibits no distension. There is no tenderness. There is no rebound and no guarding.  Genitourinary:  Speculum exam- small amount of cream colored thin creamy  discharge in vault; no pooling noted; cervix long and closed Ferning- neg  Musculoskeletal: Normal range of motion.  Neurological: She is alert and oriented to person, place, and time.  Skin: Skin is warm and dry.  Psychiatric: She has a normal mood and affect.    MAU Course  Procedures Pt had some initial uterine irritability when placed on the monitor While pt here, tightening/irritability subsided Baby active with reactive FHR- baseline 145 bpm with 6-25 BPM variability and 15x15 accelerations- no decelerations No ferning noted on slide Discussed with Dr. Philis Pique- will get AFI to confirm AFI 25%-reported to Dr. Philis Pique Reported to Dr. Philis Pique Assessment and Plan  Vaginal  discharge abd pain in 3rd trimester Reactive NST Normal AFI F/u with appointment  Kick counts  LINEBERRY,SUSAN 11/30/2014, 6:49 PM

## 2014-12-08 LAB — OB RESULTS CONSOLE GBS: STREP GROUP B AG: POSITIVE

## 2015-01-02 ENCOUNTER — Inpatient Hospital Stay (HOSPITAL_COMMUNITY)
Admission: AD | Admit: 2015-01-02 | Discharge: 2015-01-05 | DRG: 775 | Disposition: A | Discharge status: Home / Self Care | Payer: BLUE CROSS/BLUE SHIELD | Source: Ambulatory Visit | Attending: Obstetrics and Gynecology | Admitting: Obstetrics and Gynecology

## 2015-01-02 DIAGNOSIS — O9962 Diseases of the digestive system complicating childbirth: Secondary | ICD-10-CM | POA: Diagnosis present

## 2015-01-02 DIAGNOSIS — R51 Headache: Secondary | ICD-10-CM | POA: Diagnosis present

## 2015-01-02 DIAGNOSIS — K219 Gastro-esophageal reflux disease without esophagitis: Secondary | ICD-10-CM | POA: Diagnosis present

## 2015-01-02 DIAGNOSIS — J45909 Unspecified asthma, uncomplicated: Secondary | ICD-10-CM | POA: Diagnosis present

## 2015-01-02 DIAGNOSIS — O99354 Diseases of the nervous system complicating childbirth: Secondary | ICD-10-CM | POA: Diagnosis present

## 2015-01-02 DIAGNOSIS — O99824 Streptococcus B carrier state complicating childbirth: Secondary | ICD-10-CM | POA: Diagnosis present

## 2015-01-02 DIAGNOSIS — G709 Myoneural disorder, unspecified: Secondary | ICD-10-CM | POA: Diagnosis present

## 2015-01-02 DIAGNOSIS — Z3A39 39 weeks gestation of pregnancy: Secondary | ICD-10-CM

## 2015-01-02 DIAGNOSIS — O9952 Diseases of the respiratory system complicating childbirth: Secondary | ICD-10-CM | POA: Diagnosis present

## 2015-01-02 DIAGNOSIS — O9081 Anemia of the puerperium: Secondary | ICD-10-CM | POA: Diagnosis present

## 2015-01-02 NOTE — MAU Note (Signed)
Pt states water broke at 9:50p-clear/pink colored. Contractions every 3 mins. +FM. +GBS

## 2015-01-03 ENCOUNTER — Inpatient Hospital Stay (HOSPITAL_COMMUNITY): Payer: BLUE CROSS/BLUE SHIELD | Admitting: Anesthesiology

## 2015-01-03 ENCOUNTER — Encounter (HOSPITAL_COMMUNITY): Payer: Self-pay

## 2015-01-03 DIAGNOSIS — K219 Gastro-esophageal reflux disease without esophagitis: Secondary | ICD-10-CM | POA: Diagnosis present

## 2015-01-03 DIAGNOSIS — Z3A39 39 weeks gestation of pregnancy: Secondary | ICD-10-CM | POA: Diagnosis not present

## 2015-01-03 DIAGNOSIS — O9081 Anemia of the puerperium: Secondary | ICD-10-CM | POA: Diagnosis present

## 2015-01-03 DIAGNOSIS — O99354 Diseases of the nervous system complicating childbirth: Secondary | ICD-10-CM | POA: Diagnosis present

## 2015-01-03 DIAGNOSIS — R51 Headache: Secondary | ICD-10-CM | POA: Diagnosis present

## 2015-01-03 DIAGNOSIS — O9962 Diseases of the digestive system complicating childbirth: Secondary | ICD-10-CM | POA: Diagnosis present

## 2015-01-03 LAB — TYPE AND SCREEN
ABO/RH(D): O POS
Antibody Screen: NEGATIVE

## 2015-01-03 LAB — CBC
HCT: 33 % — ABNORMAL LOW (ref 36.0–46.0)
Hemoglobin: 10.8 g/dL — ABNORMAL LOW (ref 12.0–15.0)
MCH: 27.9 pg (ref 26.0–34.0)
MCHC: 32.7 g/dL (ref 30.0–36.0)
MCV: 85.3 fL (ref 78.0–100.0)
PLATELETS: 251 10*3/uL (ref 150–400)
RBC: 3.87 MIL/uL (ref 3.87–5.11)
RDW: 14.3 % (ref 11.5–15.5)
WBC: 11.8 10*3/uL — AB (ref 4.0–10.5)

## 2015-01-03 LAB — POCT FERN TEST: POCT Fern Test: POSITIVE

## 2015-01-03 LAB — ABO/RH: ABO/RH(D): O POS

## 2015-01-03 LAB — RPR: RPR Ser Ql: NONREACTIVE

## 2015-01-03 MED ORDER — OXYTOCIN 40 UNITS IN LACTATED RINGERS INFUSION - SIMPLE MED
1.0000 m[IU]/min | INTRAVENOUS | Status: DC
  Filled 2015-01-03: qty 1000

## 2015-01-03 MED ORDER — FLEET ENEMA 7-19 GM/118ML RE ENEM: 1.0000 | ENEMA | RECTAL | Status: DC | PRN

## 2015-01-03 MED ORDER — OXYCODONE-ACETAMINOPHEN 5-325 MG PO TABS: 1.0000 | ORAL_TABLET | ORAL | Status: DC | PRN

## 2015-01-03 MED ORDER — PHENYLEPHRINE 40 MCG/ML (10ML) SYRINGE FOR IV PUSH (FOR BLOOD PRESSURE SUPPORT)
80.0000 ug | PREFILLED_SYRINGE | INTRAVENOUS | Status: DC | PRN
  Filled 2015-01-03: qty 2

## 2015-01-03 MED ORDER — BUPIVACAINE HCL (PF) 0.25 % IJ SOLN
INTRAMUSCULAR | Status: DC | PRN
  Administered 2015-01-03 (×2): 4 mL via EPIDURAL

## 2015-01-03 MED ORDER — PRENATAL MULTIVITAMIN CH
1.0000 | ORAL_TABLET | Freq: Every day | ORAL | Status: DC
  Administered 2015-01-04: 1 via ORAL
  Filled 2015-01-03: qty 1

## 2015-01-03 MED ORDER — ACETAMINOPHEN 325 MG PO TABS: 650.0000 mg | ORAL_TABLET | ORAL | Status: DC | PRN

## 2015-01-03 MED ORDER — WITCH HAZEL-GLYCERIN EX PADS
1.0000 "application " | MEDICATED_PAD | CUTANEOUS | Status: DC | PRN
  Administered 2015-01-03 – 2015-01-05 (×2): 1 via TOPICAL

## 2015-01-03 MED ORDER — PHENYLEPHRINE 40 MCG/ML (10ML) SYRINGE FOR IV PUSH (FOR BLOOD PRESSURE SUPPORT)
PREFILLED_SYRINGE | INTRAVENOUS | Status: AC
  Filled 2015-01-03: qty 20

## 2015-01-03 MED ORDER — ONDANSETRON HCL 4 MG/2ML IJ SOLN: 4.0000 mg | INTRAMUSCULAR | Status: DC | PRN

## 2015-01-03 MED ORDER — DEXTROSE 5 % IV SOLN
2.5000 10*6.[IU] | INTRAVENOUS | Status: DC
  Administered 2015-01-03 (×2): 2.5 10*6.[IU] via INTRAVENOUS
  Filled 2015-01-03 (×7): qty 2.5

## 2015-01-03 MED ORDER — IBUPROFEN 600 MG PO TABS
600.0000 mg | ORAL_TABLET | Freq: Four times a day (QID) | ORAL | Status: DC
  Administered 2015-01-03 – 2015-01-05 (×9): 600 mg via ORAL
  Filled 2015-01-03 (×9): qty 1

## 2015-01-03 MED ORDER — LACTATED RINGERS IV SOLN
500.0000 mL | INTRAVENOUS | Status: DC | PRN
  Administered 2015-01-03: 500 mL via INTRAVENOUS

## 2015-01-03 MED ORDER — CITRIC ACID-SODIUM CITRATE 334-500 MG/5ML PO SOLN: 30.0000 mL | ORAL | Status: DC | PRN

## 2015-01-03 MED ORDER — METHYLERGONOVINE MALEATE 0.2 MG PO TABS: 0.2000 mg | ORAL_TABLET | ORAL | Status: DC | PRN

## 2015-01-03 MED ORDER — OXYTOCIN BOLUS FROM INFUSION
500.0000 mL | INTRAVENOUS | Status: DC
  Administered 2015-01-03: 500 mL via INTRAVENOUS

## 2015-01-03 MED ORDER — BENZOCAINE-MENTHOL 20-0.5 % EX AERO
1.0000 "application " | INHALATION_SPRAY | CUTANEOUS | Status: DC | PRN
  Administered 2015-01-03 – 2015-01-05 (×2): 1 via TOPICAL
  Filled 2015-01-03 (×2): qty 56

## 2015-01-03 MED ORDER — OXYCODONE-ACETAMINOPHEN 5-325 MG PO TABS: 2.0000 | ORAL_TABLET | ORAL | Status: DC | PRN

## 2015-01-03 MED ORDER — PENICILLIN G POTASSIUM 5000000 UNITS IJ SOLR
5.0000 10*6.[IU] | Freq: Once | INTRAMUSCULAR | Status: AC
  Administered 2015-01-03: 5 10*6.[IU] via INTRAVENOUS
  Filled 2015-01-03: qty 5

## 2015-01-03 MED ORDER — METHYLERGONOVINE MALEATE 0.2 MG/ML IJ SOLN: 0.2000 mg | INTRAMUSCULAR | Status: DC | PRN

## 2015-01-03 MED ORDER — ONDANSETRON HCL 4 MG/2ML IJ SOLN: 4.0000 mg | Freq: Four times a day (QID) | INTRAMUSCULAR | Status: DC | PRN

## 2015-01-03 MED ORDER — SIMETHICONE 80 MG PO CHEW: 80.0000 mg | CHEWABLE_TABLET | ORAL | Status: DC | PRN

## 2015-01-03 MED ORDER — LIDOCAINE-EPINEPHRINE (PF) 2 %-1:200000 IJ SOLN
INTRAMUSCULAR | Status: DC | PRN
  Administered 2015-01-03: 3 mL

## 2015-01-03 MED ORDER — FENTANYL 2.5 MCG/ML BUPIVACAINE 1/10 % EPIDURAL INFUSION (WH - ANES)
14.0000 mL/h | INTRAMUSCULAR | Status: DC | PRN
  Administered 2015-01-03: 14 mL/h via EPIDURAL
  Administered 2015-01-03: 12 mL/h via EPIDURAL

## 2015-01-03 MED ORDER — TETANUS-DIPHTH-ACELL PERTUSSIS 5-2.5-18.5 LF-MCG/0.5 IM SUSP: 0.5000 mL | Freq: Once | INTRAMUSCULAR | Status: DC

## 2015-01-03 MED ORDER — DIPHENHYDRAMINE HCL 50 MG/ML IJ SOLN: 12.5000 mg | INTRAMUSCULAR | Status: DC | PRN

## 2015-01-03 MED ORDER — ZOLPIDEM TARTRATE 5 MG PO TABS: 5.0000 mg | ORAL_TABLET | Freq: Every evening | ORAL | Status: DC | PRN

## 2015-01-03 MED ORDER — SENNOSIDES-DOCUSATE SODIUM 8.6-50 MG PO TABS
2.0000 | ORAL_TABLET | ORAL | Status: DC
  Administered 2015-01-03 – 2015-01-04 (×2): 2 via ORAL
  Filled 2015-01-03 (×2): qty 2

## 2015-01-03 MED ORDER — LACTATED RINGERS IV SOLN
INTRAVENOUS | Status: DC
  Administered 2015-01-03: 02:00:00 via INTRAVENOUS

## 2015-01-03 MED ORDER — DIPHENHYDRAMINE HCL 25 MG PO CAPS: 25.0000 mg | ORAL_CAPSULE | Freq: Four times a day (QID) | ORAL | Status: DC | PRN

## 2015-01-03 MED ORDER — LANOLIN HYDROUS EX OINT: TOPICAL_OINTMENT | CUTANEOUS | Status: DC | PRN

## 2015-01-03 MED ORDER — TERBUTALINE SULFATE 1 MG/ML IJ SOLN
0.2500 mg | Freq: Once | INTRAMUSCULAR | Status: DC | PRN
  Filled 2015-01-03: qty 1

## 2015-01-03 MED ORDER — EPHEDRINE 5 MG/ML INJ
10.0000 mg | INTRAVENOUS | Status: DC | PRN
  Filled 2015-01-03: qty 2

## 2015-01-03 MED ORDER — OXYTOCIN 40 UNITS IN LACTATED RINGERS INFUSION - SIMPLE MED: 62.5000 mL/h | INTRAVENOUS | Status: DC

## 2015-01-03 MED ORDER — FENTANYL 2.5 MCG/ML BUPIVACAINE 1/10 % EPIDURAL INFUSION (WH - ANES)
INTRAMUSCULAR | Status: AC
  Filled 2015-01-03: qty 125

## 2015-01-03 MED ORDER — ONDANSETRON HCL 4 MG PO TABS: 4.0000 mg | ORAL_TABLET | ORAL | Status: DC | PRN

## 2015-01-03 MED ORDER — DIBUCAINE 1 % RE OINT
1.0000 "application " | TOPICAL_OINTMENT | RECTAL | Status: DC | PRN
  Administered 2015-01-03 – 2015-01-05 (×2): 1 via RECTAL
  Filled 2015-01-03 (×2): qty 28

## 2015-01-03 MED ORDER — ALBUTEROL SULFATE (2.5 MG/3ML) 0.083% IN NEBU: 3.0000 mL | INHALATION_SOLUTION | RESPIRATORY_TRACT | Status: DC | PRN

## 2015-01-03 MED ORDER — LIDOCAINE HCL (PF) 1 % IJ SOLN
30.0000 mL | INTRAMUSCULAR | Status: DC | PRN
  Filled 2015-01-03: qty 30

## 2015-01-03 NOTE — Anesthesia Preprocedure Evaluation (Signed)
Anesthesia Evaluation  Patient identified by MRN, date of birth, ID band Patient awake    Reviewed: Allergy & Precautions, Patient's Chart, lab work & pertinent test results  History of Anesthesia Complications Negative for: history of anesthetic complications  Airway Mallampati: II  TM Distance: >3 FB Neck ROM: Full    Dental  (+) Teeth Intact   Pulmonary asthma ,    breath sounds clear to auscultation       Cardiovascular negative cardio ROS   Rhythm:Regular     Neuro/Psych  Headaches,  Neuromuscular disease negative psych ROS   GI/Hepatic Neg liver ROS, GERD  ,  Endo/Other  negative endocrine ROS  Renal/GU negative Renal ROS     Musculoskeletal   Abdominal   Peds  Hematology negative hematology ROS (+)   Anesthesia Other Findings   Reproductive/Obstetrics (+) Pregnancy                             Anesthesia Physical Anesthesia Plan  ASA: II  Anesthesia Plan: Epidural   Post-op Pain Management:    Induction:   Airway Management Planned:   Additional Equipment:   Intra-op Plan:   Post-operative Plan:   Informed Consent: I have reviewed the patients History and Physical, chart, labs and discussed the procedure including the risks, benefits and alternatives for the proposed anesthesia with the patient or authorized representative who has indicated his/her understanding and acceptance.   Dental advisory given  Plan Discussed with: Anesthesiologist  Anesthesia Plan Comments:         Anesthesia Quick Evaluation

## 2015-01-03 NOTE — Lactation Note (Signed)
This note was copied from the chart of [place]. Lactation Consultation Note Initial visit at 5 hours of age.  Mom is holding baby STS, baby is sleepy and has only had 1 feeding in life.  Mom has easily expressed colostrum, LC applied drops of colostrum to baby's mouth.  Baby did not wake up to latch.  Assisted mom with football positioning and encouraged mom to call for assist as needed.  [facility] LC resources given and discussed.  Encouraged to feed with early cues on demand.  Early newborn behavior discussed.  Hand expression demonstrated with colostrum visible.  Mom to call for assist as needed.    Patient Name: Caitlin Duarte [id number] Date: 01/03/2015 Reason for consult: Initial assessment   Maternal Data Has patient been taught Hand Expression?: Yes Does the patient have breastfeeding experience prior to this delivery?: No  Feeding Feeding Type: Breast Fed  LATCH Score/Interventions Latch: Too sleepy or reluctant, no latch achieved, no sucking elicited.  Audible Swallowing: None  Type of Nipple: Everted at rest and after stimulation  Comfort (Breast/Nipple): Soft / non-tender     Hold (Positioning): Assistance needed to correctly position infant at breast and maintain latch. Intervention(s): Breastfeeding basics reviewed;Support Pillows;Position options;Skin to skin  LATCH Score: 5  Lactation Tools Discussed/Used WIC Program: No   Consult Status Consult Status: Follow-up Date: 01/04/15 Follow-up type: In-patient    [place], Justine Null 01/03/2015, 5:13 PM

## 2015-01-03 NOTE — Progress Notes (Signed)
Sitting up for epidural. Toco not tracing well.

## 2015-01-03 NOTE — Anesthesia Procedure Notes (Signed)
Epidural Patient location during procedure: OB  Staffing Anesthesiologist: Lashane Whelpley Performed by: anesthesiologist   Preanesthetic Checklist Completed: patient identified, surgical consent, pre-op evaluation, timeout performed, IV checked, risks and benefits discussed and monitors and equipment checked  Epidural Patient position: sitting Prep: DuraPrep Patient monitoring: heart rate, cardiac monitor, continuous pulse ox and blood pressure Approach: midline Location: L3-L4 Injection technique: LOR saline  Needle:  Needle type: Tuohy  Needle gauge: 17 G Needle length: 9 cm Needle insertion depth: 5 cm Catheter type: closed end flexible Catheter size: 19 Gauge Catheter at skin depth: 12 cm Test dose: negative and 2% lidocaine with Epi 1:200 K  Assessment Events: blood not aspirated, injection not painful, no injection resistance, negative IV test and no paresthesia  Additional Notes Reason for block:procedure for pain

## 2015-01-03 NOTE — H&P (Signed)
29 y.o. [redacted]w[redacted]d  G1P0 comes in c/o SROM.  Otherwise has good fetal movement and no bleeding.  Past Medical History  Diagnosis Date  . Migraines   . Asthma     Past Surgical History  Procedure Laterality Date  . Eye surgery    . Nose surgery  2006  . Appendectomy    . Wisdom tooth extraction  2006  . Sinus surgery with instatrak      OB History  Gravida Para Term Preterm AB SAB TAB Ectopic Multiple Living  1         0    # Outcome Date GA Lbr Len/2nd Weight Sex Delivery Anes PTL Lv  1 Current               Social History   Social History  . Marital Status: Married    Spouse Name: N/A  . Number of Children: N/A  . Years of Education: N/A   Occupational History  . Not on file.   Social History Main Topics  . Smoking status: Never Smoker   . Smokeless tobacco: Never Used  . Alcohol Use: Yes     Comment: socially  . Drug Use: No  . Sexual Activity: Yes    Birth Control/ Protection: None   Other Topics Concern  . Not on file   Social History Narrative   Other    Prenatal Transfer Tool  Maternal Diabetes: No Genetic Screening: Normal Maternal Ultrasounds/Referrals: Abnormal:  Findings:   Isolated EIF (echogenic intracardiac focus) Fetal Ultrasounds or other Referrals:  None Maternal Substance Abuse:  No Significant Maternal Medications:  None Significant Maternal Lab Results: Lab values include: Group B Strep positive  Other PNC: uncomplicated.    Filed Vitals:   01/03/15 0346  BP: 118/76  Pulse: 96  Temp:   Resp:      Lungs/Cor:  NAD Abdomen:  soft, gravid Ex:  no cords, erythema SVE:  1.5/80/-2 FHTs:  135, good STV, NST R Toco:  q2-4   A/P  Admit to L&D for SROM  GBS Pos - PCN  Epidural when desired  Other routine care  [place], Casimer Bilis

## 2015-01-04 LAB — CBC
HCT: 26 % — ABNORMAL LOW (ref 36.0–46.0)
Hemoglobin: 8.7 g/dL — ABNORMAL LOW (ref 12.0–15.0)
MCH: 28.5 pg (ref 26.0–34.0)
MCHC: 33.5 g/dL (ref 30.0–36.0)
MCV: 85.2 fL (ref 78.0–100.0)
PLATELETS: 207 10*3/uL (ref 150–400)
RBC: 3.05 MIL/uL — AB (ref 3.87–5.11)
RDW: 14.3 % (ref 11.5–15.5)
WBC: 15.3 10*3/uL — AB (ref 4.0–10.5)

## 2015-01-04 MED ORDER — FERROUS SULFATE 325 (65 FE) MG PO TABS
325.0000 mg | ORAL_TABLET | Freq: Two times a day (BID) | ORAL | Status: DC
  Administered 2015-01-04 – 2015-01-05 (×2): 325 mg via ORAL
  Filled 2015-01-04 (×2): qty 1

## 2015-01-04 NOTE — Anesthesia Postprocedure Evaluation (Signed)
  Anesthesia Post-op Note  Patient: Caitlin Duarte  Procedure(s) Performed: * No procedures listed *  Patient Location: PACU and Mother/Baby  Anesthesia Type:Epidural  Level of Consciousness: awake, alert  and oriented  Airway and Oxygen Therapy: Patient Spontanous Breathing  Post-op Pain: mild  Post-op Assessment: Patient's Cardiovascular Status Stable, Respiratory Function Stable, No signs of Nausea or vomiting, Adequate PO intake, Pain level controlled, No headache, No backache and Patient able to bend at knees              Post-op Vital Signs: Reviewed and stable  Last Vitals:  Filed Vitals:   01/04/15 0720  BP: 101/62  Pulse: 71  Temp: 36.6 C  Resp: 18    Complications: No apparent anesthesia complications

## 2015-01-04 NOTE — Progress Notes (Signed)
Patient is eating, ambulating, voiding.  Pain control is good.  Filed Vitals:   01/03/15 1343 01/03/15 1350 01/03/15 1450 01/04/15 0720  BP:  108/66 111/73 101/62  Pulse:  100 107 71  Temp: 98.5 F (36.9 C) 98.7 F (37.1 C) 99 F (37.2 C) 97.8 F (36.6 C)  TempSrc: Axillary Oral Oral Oral  Resp:  18 18 18   Height:      Weight:      SpO2:        Fundus firm Perineum without swelling.  Lab Results  Component Value Date   WBC 15.3* 01/04/2015   HGB 8.7* 01/04/2015   HCT 26.0* 01/04/2015   MCV 85.2 01/04/2015   PLT 207 01/04/2015    --/--/O POS, O POS (10/24 0140)/RI  A/P Post partum day 1- mild anemia, iron.  Routine care.  Expect d/c routine.    Kecia Swoboda A

## 2015-01-04 NOTE — Progress Notes (Signed)
Unable to do circ- baby has not voided yet.

## 2015-01-04 NOTE — Lactation Note (Signed)
This note was copied from the chart of [name]. Lactation Consultation Note  Patient Name: Caitlin Duarte ZMCEY'E Date: 01/04/2015 Reason for consult: Follow-up assessment   Follow up assessment with 38 hour old infant. Infant with 7 BF 3 attempts, 2 voids and 6 stools in last 24 hours. Infant with 2 % weight loss in last 24 hours. Mom with small form breasts and long shaft erect nipples. Mom reports pain with latch and during most of feedings. Infant cueing to feed.  Undressed infant and places STS, Assisted mom in latching infant to left breast in cross cradle hold. Infant with lips curled in, taught parents to assess and correct as needed. This position continued to be painful. Had infant suck on gloved finger and noted that infant draws tongue behind gum line. Taught parents to do suck training prior to each feeding. Infant is able to extend tongue over gumline and can lick lips. . Latched infant to right breast in football hold. Assisted mom in obtaining a deeper latch and flanging lips, mom reported it felt much better. Enc. Mom to feed 8-12 x in 24 hours at first feeding cues. Infant has had some sleepiness but is now more willing to feed. Discussed BF basics, STS, positioning, alignment, stomach size, nipple care, nutritional needs of NB, NB feeding behavior and normalcy of cluster feeds, I/O, follow up care, avoiding pacifiers, and when to introduce bottles for return to work. [place] [employer] reviewed including [place] Phone #, Support Groups, OP services, and BF Resources. Enc parent to call with questions/concerns prn.    Maternal Data Formula Feeding for Exclusion: No Does the patient have breastfeeding experience prior to this delivery?: No  Feeding Feeding Type: Breast Fed Length of feed: 10 min  LATCH Score/Interventions Latch: Repeated attempts needed to sustain latch, nipple held in mouth throughout feeding, stimulation needed to elicit sucking reflex. Intervention(s): Skin to  skin;Teach feeding cues;Waking techniques Intervention(s): Adjust position;Assist with latch;Breast massage;Breast compression  Audible Swallowing: Spontaneous and intermittent Intervention(s): Skin to skin  Type of Nipple: Everted at rest and after stimulation  Comfort (Breast/Nipple): Filling, red/small blisters or bruises, mild/mod discomfort  Problem noted: Mild/Moderate discomfort Interventions (Mild/moderate discomfort): Comfort gels (Flanging lips after latch to improve discomfort)  Hold (Positioning): Assistance needed to correctly position infant at breast and maintain latch. Intervention(s): Breastfeeding basics reviewed;Support Pillows;Position options;Skin to skin  LATCH Score: 7  Lactation Tools Discussed/Used     Consult Status Consult Status: Follow-up Date: 01/05/15 Follow-up type: In-patient    Debby Freiberg Schuyler Behan 01/04/2015, 4:08 PM

## 2015-01-05 ENCOUNTER — Encounter (HOSPITAL_COMMUNITY): Payer: Self-pay | Admitting: *Deleted

## 2015-01-05 MED ORDER — IBUPROFEN 600 MG PO TABS: 600.0000 mg | ORAL_TABLET | Freq: Four times a day (QID) | ORAL | Status: DC

## 2015-01-05 NOTE — Lactation Note (Addendum)
This note was copied from the chart of [place]. Lactation Consultation Note  Patient Name: Caitlin Duarte FAOZH'Y Date: 01/05/2015 Reason for consult: Follow-up assessment;Breast/nipple pain   Follow up with 58 hour old infant prior to D/C. Infant with 13 BF for 10-45 minutes, 1 attempt, 3 voids and 2 stools in last 24 hours. He has a 6% weight loss since birth. Mom reports infant fed well last night and nipple soreness has improved. Mom reports her breast are feeling fuller today and she is hearing more swallows. Mom has a hand pump and a DEBP at home. Baby has [place] with private Ped tomorrow morning and Ped appt on Friday. Parents advised to maintain feeding log and take to [facility] and Ped appt. Encouraged family to call [facility] Office with questions/concerns.    Maternal Data Formula Feeding for Exclusion: No Has patient been taught Hand Expression?: Yes Does the patient have breastfeeding experience prior to this delivery?: No  Feeding Feeding Type: Breast Fed  LATCH Score/Interventions Latch: Repeated attempts needed to sustain latch, nipple held in mouth throughout feeding, stimulation needed to elicit sucking reflex. Intervention(s):  (widen latch) Intervention(s): Adjust position;Assist with latch  Audible Swallowing: Spontaneous and intermittent Intervention(s): Hand expression  Type of Nipple: Everted at rest and after stimulation  Comfort (Breast/Nipple): Filling, red/small blisters or bruises, mild/mod discomfort  Problem noted: Mild/Moderate discomfort Interventions (Mild/moderate discomfort): Comfort gels  Hold (Positioning): Assistance needed to correctly position infant at breast and maintain latch.  LATCH Score: 7  Lactation Tools Discussed/Used WIC Program: No Pump Review: Setup, frequency, and cleaning;Milk Storage   Consult Status Consult Status: Complete Date: 01/05/15 Follow-up type: Call as needed    Caitlin Duarte 01/05/2015, 10:30 AM

## 2015-01-05 NOTE — Discharge Summary (Signed)
Obstetric Discharge Summary Reason for Admission: rupture of membranes Prenatal Procedures: ultrasound Intrapartum Procedures: spontaneous vaginal delivery Postpartum Procedures: none Complications-Operative and Postpartum: 2nd degree perineal laceration HEMOGLOBIN  Date Value Ref Range Status  01/04/2015 8.7* 12.0 - 15.0 g/dL Final   HCT  Date Value Ref Range Status  01/04/2015 26.0* 36.0 - 46.0 % Final    Physical Exam:  General: alert and cooperative Lochia: appropriate Uterine Fundus: firm DVT Evaluation: No evidence of DVT seen on physical exam.  Discharge Diagnoses: Term Pregnancy-delivered  Discharge Information: Date: 01/05/2015 Activity: pelvic rest Diet: routine Medications: PNV, Ibuprofen, Colace and Iron Condition: stable Instructions: refer to practice specific booklet Discharge to: home Follow-up Information    Follow up with Marcial Pacas., MD In 4 weeks.   Specialty:  Obstetrics and Gynecology   Contact information:   [place] [phone]       Newborn Data: Live born female  Birth Weight: 7 lb 10.1 oz (3460 g) APGAR: 9, 9  Home with mother.  Allyn Kenner 01/05/2015, 9:43 AM

## 2015-01-31 ENCOUNTER — Other Ambulatory Visit: Payer: Self-pay | Admitting: Obstetrics and Gynecology

## 2015-02-01 LAB — CYTOLOGY - PAP

## 2015-05-23 ENCOUNTER — Ambulatory Visit (INDEPENDENT_AMBULATORY_CARE_PROVIDER_SITE_OTHER): Payer: 59 | Admitting: Family Medicine

## 2015-05-23 ENCOUNTER — Ambulatory Visit (HOSPITAL_BASED_OUTPATIENT_CLINIC_OR_DEPARTMENT_OTHER)
Admission: RE | Admit: 2015-05-23 | Discharge: 2015-05-23 | Disposition: A | Discharge status: Home / Self Care | Payer: 59 | Source: Ambulatory Visit | Attending: Family Medicine | Admitting: Family Medicine

## 2015-05-23 ENCOUNTER — Encounter: Payer: Self-pay | Admitting: Family Medicine

## 2015-05-23 VITALS — BP 110/70 | HR 84 | Temp 98.4°F | Ht 65.5 in | Wt 126.2 lb

## 2015-05-23 DIAGNOSIS — S93401A Sprain of unspecified ligament of right ankle, initial encounter: Secondary | ICD-10-CM | POA: Diagnosis not present

## 2015-05-23 DIAGNOSIS — M6248 Contracture of muscle, other site: Secondary | ICD-10-CM | POA: Diagnosis not present

## 2015-05-23 DIAGNOSIS — S93409A Sprain of unspecified ligament of unspecified ankle, initial encounter: Secondary | ICD-10-CM | POA: Insufficient documentation

## 2015-05-23 DIAGNOSIS — R0781 Pleurodynia: Secondary | ICD-10-CM

## 2015-05-23 DIAGNOSIS — M62838 Other muscle spasm: Secondary | ICD-10-CM

## 2015-05-23 MED ORDER — NAPROXEN 500 MG PO TABS: 500.0000 mg | ORAL_TABLET | Freq: Two times a day (BID) | ORAL | Status: DC

## 2015-05-23 MED ORDER — BACLOFEN 10 MG PO TABS: 10.0000 mg | ORAL_TABLET | Freq: Three times a day (TID) | ORAL | Status: DC

## 2015-05-23 NOTE — Progress Notes (Signed)
Pre visit review using our clinic review tool, if applicable. No additional management support is needed unless otherwise documented below in the visit note. 

## 2015-05-23 NOTE — Progress Notes (Signed)
   Subjective:    Patient ID: ALIYYAH TRENKAMP, female    DOB: 05/31/1985, 30 y.o.   MRN: [id number]  HPI Fall- pt slipped on stairs on Saturday while holding baby (baby w/ skull fx).  Pt now w/ back and neck pain.  R ankle pain.  Pain on dorsum of ankle- pain w/ plantar and dorsiflexion.  Pain w/ weight bearing.  Denies hitting head or LOC.  No skin tears or lacerations.   Review of Systems For ROS see HPI     Objective:   Physical Exam  Constitutional: She is oriented to person, place, and time. She appears well-developed and well-nourished. No distress.  HENT:  Head: Normocephalic and atraumatic.  Neck:  Marked trap spasm on L w/ limited neck rotation to L side  Pulmonary/Chest: Effort normal and breath sounds normal. No respiratory distress. She has no wheezes. She has no rales. She exhibits tenderness (TTP over L posterior lower ribs).  Musculoskeletal:  No R ankle swelling Mild TTP over dorsum of R foot w/ limited dorsiflexion Full plantar flexion, no pain w/ inversion/eversion No TTP over medial or lateral malleoli  Neurological: She is alert and oriented to person, place, and time. She has normal reflexes.  Skin: Skin is warm and dry.  Psychiatric: She has a normal mood and affect. Her behavior is normal. Thought content normal.  Vitals reviewed.         Assessment & Plan:

## 2015-05-23 NOTE — Patient Instructions (Signed)
Follow up as needed Go downstairs and get your xray done Start the Naproxen twice daily w/ food for pain/inflammation Use the Baclofen as needed for spasm- start w/ 1/2 tab and increase to 1 tab as needed Alternate heat/ice Wear supportive shoes when walking to support your ankle- ice and elevate when you are able Call with any questions or concerns If you want to join [place] at the new [place] office, any scheduled appointments will automatically transfer and we will see you at [address] [place] [address] [place], [place] [address] ([place] 3/23) [place] in there!!!

## 2015-05-24 NOTE — Assessment & Plan Note (Signed)
New.  R ankle.  No pain or swelling over medial or lateral malleolus.  No need for imaging.  Reviewed supportive care.  Pt expressed understanding and is in agreement w/ plan.

## 2015-05-24 NOTE — Assessment & Plan Note (Signed)
New.  Will get xray to assess for possible fx.  Start scheduled NSAIDs, muscle relaxer prn- both safe in breast feeding.  Recommended heating pad for comfort.  Reviewed that even if no fx, a deep bruise like this can take 6-8 weeks to resolve.  Pt expressed understanding and is in agreement w/ plan.

## 2015-05-24 NOTE — Assessment & Plan Note (Signed)
Recurrent issue for pt- deteriorated.  Marked spasm on L.  Start Baclofen- safe in lactation.  Encouraged stretching and massage.  Will follow.

## 2015-11-11 ENCOUNTER — Encounter: Payer: Self-pay | Admitting: Family Medicine

## 2015-11-11 ENCOUNTER — Ambulatory Visit (INDEPENDENT_AMBULATORY_CARE_PROVIDER_SITE_OTHER): Payer: 59 | Admitting: Family Medicine

## 2015-11-11 VITALS — BP 106/78 | HR 88 | Temp 98.2°F | Resp 17 | Ht 66.0 in | Wt 124.5 lb

## 2015-11-11 DIAGNOSIS — J029 Acute pharyngitis, unspecified: Secondary | ICD-10-CM | POA: Diagnosis not present

## 2015-11-11 LAB — POCT RAPID STREP A (OFFICE): RAPID STREP A SCREEN: NEGATIVE

## 2015-11-11 NOTE — Patient Instructions (Signed)
Follow up as needed This is not strep- great news!!! Drink plenty of fluids Ibuprofen as needed for pain/fevers As long as you wash your hands and keep a slight distance, you should be ok to visit this weekend Call with any questions or concerns Happy Labor Day!

## 2015-11-11 NOTE — Progress Notes (Signed)
Pre visit review using our clinic review tool, if applicable. No additional management support is needed unless otherwise documented below in the visit note. 

## 2015-11-11 NOTE — Progress Notes (Signed)
   Subjective:    Patient ID: BRIANNIE LAGOW, female    DOB: [birth year], 30 y.o.   MRN: [id number]  HPI Sore throat- sxs started yesterday w/ sore throat.  Pain woke her from sleep last night.  No fever.  + body aches.  + nausea.     Review of Systems For ROS see HPI     Objective:   Physical Exam  Constitutional: She is oriented to person, place, and time. She appears well-developed and well-nourished. No distress.  HENT:  Head: Normocephalic and atraumatic.  Nose: Nose normal.  TMs normal bilaterally No TTP over sinuses Copious PND Bilateral tonsillar enlargement w/o erythema and exudate  Neck: Normal range of motion. Neck supple.  Cardiovascular: Normal rate, regular rhythm and normal heart sounds.   Pulmonary/Chest: Effort normal and breath sounds normal. No respiratory distress. She has no wheezes. She has no rales.  Lymphadenopathy:    She has no cervical adenopathy.  Neurological: She is alert and oriented to person, place, and time.  Psychiatric: She has a normal mood and affect. Her behavior is normal. Thought content normal.  Vitals reviewed.         Assessment & Plan:  Sore throat- PE is not consistent w/ strep and rapid test (-) in office.  Most likely viral.  No need for abx.  Reviewed supportive care and red flags that should prompt return.  Pt expressed understanding and is in agreement w/ plan.

## 2015-12-07 ENCOUNTER — Encounter: Payer: Self-pay | Admitting: Family Medicine

## 2015-12-07 ENCOUNTER — Ambulatory Visit (INDEPENDENT_AMBULATORY_CARE_PROVIDER_SITE_OTHER): Payer: 59 | Admitting: Family Medicine

## 2015-12-07 VITALS — BP 100/78 | HR 103 | Temp 98.0°F | Resp 16 | Ht 66.0 in | Wt 126.2 lb

## 2015-12-07 DIAGNOSIS — Z0000 Encounter for general adult medical examination without abnormal findings: Secondary | ICD-10-CM | POA: Diagnosis not present

## 2015-12-07 DIAGNOSIS — Z23 Encounter for immunization: Secondary | ICD-10-CM | POA: Diagnosis not present

## 2015-12-07 LAB — LIPID PANEL
CHOLESTEROL: 190 mg/dL (ref 0–200)
HDL: 58.3 mg/dL (ref >39.00)
LDL Cholesterol: 117 mg/dL — ABNORMAL HIGH (ref 0–99)
NonHDL: 131.51
Total CHOL/HDL Ratio: 3
Triglycerides: 75 mg/dL (ref 0.0–149.0)
VLDL: 15 mg/dL (ref 0.0–40.0)

## 2015-12-07 LAB — BASIC METABOLIC PANEL
BUN: 12 mg/dL (ref 6–23)
CHLORIDE: 106 meq/L (ref 96–112)
CO2: 27 meq/L (ref 19–32)
Calcium: 9.3 mg/dL (ref 8.4–10.5)
Creatinine, Ser: 0.49 mg/dL (ref 0.40–1.20)
GFR: 159.1 mL/min (ref >60.00)
GLUCOSE: 68 mg/dL — AB (ref 70–99)
POTASSIUM: 4.6 meq/L (ref 3.5–5.1)
SODIUM: 140 meq/L (ref 135–145)

## 2015-12-07 LAB — CBC WITH DIFFERENTIAL/PLATELET
BASOS ABS: 0 10*3/uL (ref 0.0–0.1)
Basophils Relative: 0.6 % (ref 0.0–3.0)
Eosinophils Absolute: 0.1 10*3/uL (ref 0.0–0.7)
Eosinophils Relative: 0.8 % (ref 0.0–5.0)
HCT: 42.8 % (ref 36.0–46.0)
Hemoglobin: 14.4 g/dL (ref 12.0–15.0)
LYMPHS ABS: 2.4 10*3/uL (ref 0.7–4.0)
Lymphocytes Relative: 32.2 % (ref 12.0–46.0)
MCHC: 33.8 g/dL (ref 30.0–36.0)
MCV: 86.7 fl (ref 78.0–100.0)
MONOS PCT: 9.3 % (ref 3.0–12.0)
Monocytes Absolute: 0.7 10*3/uL (ref 0.1–1.0)
NEUTROS ABS: 4.2 10*3/uL (ref 1.4–7.7)
NEUTROS PCT: 57.1 % (ref 43.0–77.0)
PLATELETS: 331 10*3/uL (ref 150.0–400.0)
RBC: 4.93 Mil/uL (ref 3.87–5.11)
RDW: 13.3 % (ref 11.5–15.5)
WBC: 7.4 10*3/uL (ref 4.0–10.5)

## 2015-12-07 LAB — HEPATIC FUNCTION PANEL
ALK PHOS: 94 U/L (ref 39–117)
ALT: 14 U/L (ref 0–35)
AST: 13 U/L (ref 0–37)
Albumin: 4.4 g/dL (ref 3.5–5.2)
BILIRUBIN DIRECT: 0.1 mg/dL (ref 0.0–0.3)
TOTAL PROTEIN: 6.6 g/dL (ref 6.0–8.3)
Total Bilirubin: 0.4 mg/dL (ref 0.2–1.2)

## 2015-12-07 LAB — VITAMIN D 25 HYDROXY (VIT D DEFICIENCY, FRACTURES): VITD: 20.96 ng/mL — ABNORMAL LOW (ref 30.00–100.00)

## 2015-12-07 LAB — TSH: TSH: 1.67 u[IU]/mL (ref 0.35–4.50)

## 2015-12-07 NOTE — Progress Notes (Signed)
   Subjective:    Patient ID: Caitlin Duarte, female    DOB: 04-27-1985, 30 y.o.   MRN: [id number]  HPI CPE- UTD on GYN, Tdap.  Pt to get flu shot today.     Review of Systems Patient reports no vision/ hearing changes, adenopathy,fever, weight change,  persistant/recurrent hoarseness , swallowing issues, chest pain, palpitations, edema, persistant/recurrent cough, hemoptysis, dyspnea (rest/exertional/paroxysmal nocturnal), gastrointestinal bleeding (melena, rectal bleeding), abdominal pain, significant heartburn, bowel changes, GU symptoms (dysuria, hematuria, incontinence), Gyn symptoms (abnormal  bleeding, pain),  syncope, focal weakness, memory loss, numbness & tingling, skin/hair/nail changes, abnormal bruising or bleeding, anxiety, or depression.     Objective:   Physical Exam General Appearance:    Alert, cooperative, no distress, appears stated age  Head:    Normocephalic, without obvious abnormality, atraumatic  Eyes:    PERRL, conjunctiva/corneas clear, EOM's intact, fundi    benign, both eyes  Ears:    Normal TM's and external ear canals, both ears  Nose:   Nares normal, septum midline, mucosa normal, no drainage    or sinus tenderness  Throat:   Lips, mucosa, and tongue normal; teeth and gums normal  Neck:   Supple, symmetrical, trachea midline, no adenopathy;    Thyroid: no enlargement/tenderness/nodules  Back:     Symmetric, no curvature, ROM normal, no CVA tenderness  Lungs:     Clear to auscultation bilaterally, respirations unlabored  Chest Wall:    No tenderness or deformity   Heart:    Regular rate and rhythm, S1 and S2 normal, no murmur, rub   or gallop  Breast Exam:    Deferred to GYN  Abdomen:     Soft, non-tender, bowel sounds active all four quadrants,    no masses, no organomegaly  Genitalia:    Deferred to GYN  Rectal:    Extremities:   Extremities normal, atraumatic, no cyanosis or edema  Pulses:   2+ and symmetric all extremities  Skin:   Skin color,  texture, turgor normal, no rashes or lesions  Lymph nodes:   Cervical, supraclavicular, and axillary nodes normal  Neurologic:   CNII-XII intact, normal strength, sensation and reflexes    throughout          Assessment & Plan:  CPE- PE WNL.  UTD on GYN, Tdap.  Flu shot given today.  Check labs.  Anticipatory guidance provided.

## 2015-12-07 NOTE — Patient Instructions (Signed)
Follow up in 1 year or as needed We'll notify you of your lab results and make any changes if needed Keep up the good work on healthy diet and regular exercise- you look great! Call with any questions or concerns Happy Fall!!! 

## 2015-12-07 NOTE — Progress Notes (Signed)
Pre visit review using our clinic review tool, if applicable. No additional management support is needed unless otherwise documented below in the visit note. 

## 2015-12-08 ENCOUNTER — Other Ambulatory Visit: Payer: Self-pay | Admitting: General Practice

## 2015-12-08 MED ORDER — VITAMIN D (ERGOCALCIFEROL) 1.25 MG (50000 UNIT) PO CAPS: 50000.0000 [IU] | ORAL_CAPSULE | ORAL | 0 refills | Status: DC

## 2015-12-28 ENCOUNTER — Encounter: Payer: Self-pay | Admitting: Family Medicine

## 2016-03-12 NOTE — L&D Delivery Note (Signed)
Delivery Note At 2:50 PM a viable and healthy female was delivered via  (Presentation: Left Occiput; Anterior ).  APGAR: 8, 9; weight pending .   Placenta status: spontaneous, intact.  Cord: 3V   Anesthesia:   Epidural Episiotomy:  None Lacerations:  2nd degree Suture Repair: 3.0 vicryl Est. Blood Loss (mL):  150 cc  Mom to postpartum.  Baby to Couplet care / Skin to Skin.  Vanessa Kick 02/27/2017, 3:13 PM

## 2016-06-20 ENCOUNTER — Encounter: Payer: Self-pay | Admitting: Family Medicine

## 2016-06-20 ENCOUNTER — Ambulatory Visit (INDEPENDENT_AMBULATORY_CARE_PROVIDER_SITE_OTHER): Payer: 59 | Admitting: Family Medicine

## 2016-06-20 VITALS — BP 118/62 | HR 93 | Temp 98.4°F | Resp 16 | Ht 66.0 in | Wt 132.2 lb

## 2016-06-20 DIAGNOSIS — R197 Diarrhea, unspecified: Secondary | ICD-10-CM

## 2016-06-20 DIAGNOSIS — R109 Unspecified abdominal pain: Secondary | ICD-10-CM | POA: Diagnosis not present

## 2016-06-20 LAB — HEPATIC FUNCTION PANEL
ALBUMIN: 4.7 g/dL (ref 3.6–5.1)
ALT: 16 U/L (ref 6–29)
AST: 13 U/L (ref 10–30)
Alkaline Phosphatase: 66 U/L (ref 33–115)
BILIRUBIN TOTAL: 0.3 mg/dL (ref 0.2–1.2)
Bilirubin, Direct: 0.1 mg/dL (ref <=0.2)
Indirect Bilirubin: 0.2 mg/dL (ref 0.2–1.2)
Total Protein: 6.5 g/dL (ref 6.1–8.1)

## 2016-06-20 LAB — BASIC METABOLIC PANEL
BUN: 11 mg/dL (ref 7–25)
CHLORIDE: 107 mmol/L (ref 98–110)
CO2: 19 mmol/L — AB (ref 20–31)
Calcium: 9.9 mg/dL (ref 8.6–10.2)
Creat: 0.65 mg/dL (ref 0.50–1.10)
GLUCOSE: 75 mg/dL (ref 65–99)
POTASSIUM: 4.5 mmol/L (ref 3.5–5.3)
Sodium: 140 mmol/L (ref 135–146)

## 2016-06-20 LAB — CBC WITH DIFFERENTIAL/PLATELET
Basophils Absolute: 122 cells/uL (ref 0–200)
Basophils Relative: 1 %
EOS PCT: 1 %
Eosinophils Absolute: 122 cells/uL (ref 15–500)
HCT: 40.4 % (ref 35.0–45.0)
HEMOGLOBIN: 13.6 g/dL (ref 11.7–15.5)
LYMPHS ABS: 3172 {cells}/uL (ref 850–3900)
Lymphocytes Relative: 26 %
MCH: 29.6 pg (ref 27.0–33.0)
MCHC: 33.7 g/dL (ref 32.0–36.0)
MCV: 88 fL (ref 80.0–100.0)
MPV: 9.7 fL (ref 7.5–12.5)
Monocytes Absolute: 854 cells/uL (ref 200–950)
Monocytes Relative: 7 %
NEUTROS PCT: 65 %
Neutro Abs: 7930 cells/uL — ABNORMAL HIGH (ref 1500–7800)
PLATELETS: 359 10*3/uL (ref 140–400)
RBC: 4.59 MIL/uL (ref 3.80–5.10)
RDW: 13.5 % (ref 11.0–15.0)
WBC: 12.2 10*3/uL — AB (ref 3.8–10.8)

## 2016-06-20 MED ORDER — DICYCLOMINE HCL 20 MG PO TABS: 20.0000 mg | ORAL_TABLET | Freq: Three times a day (TID) | ORAL | 1 refills | Status: DC

## 2016-06-20 NOTE — Progress Notes (Signed)
Pre visit review using our clinic review tool, if applicable. No additional management support is needed unless otherwise documented below in the visit note. 

## 2016-06-20 NOTE — Patient Instructions (Signed)
Follow up by phone or MyChart in 1 week to let me know how you're feeling We'll notify you of your lab results and make any changes if needed Drink plenty of fluids Start the Dicyclomine (Bentyl) for the cramping and diarrhea (before each meal and then before bed.  This is safe in case of pregnancy) If no improvement, we'll refer to GI Call with any questions or concerns Hang in there!!!

## 2016-06-20 NOTE — Progress Notes (Signed)
   Subjective:    Patient ID: Caitlin Duarte, female    DOB: 11-Apr-1985, 31 y.o.   MRN: [id number]  HPI abd pain- sxs started ~2 weeks ago.  Reports severe pain w/ BM- mostly diarrhea.  Now having cramping and loose stools ~15-20 minutes after eating.  Woke multiple times last night w/ gas episodes.  + bloating.  No relation to particular food.  Denies increased stress recently.  No dietary changes.  No recent travel.  No fevers.  No nausea/vomiting.  Pain will double her over.  No mucous or blood in stool.  No sick contacts.   Review of Systems For ROS see HPI     Objective:   Physical Exam  Constitutional: She is oriented to person, place, and time. She appears well-developed and well-nourished. No distress.  HENT:  Head: Normocephalic and atraumatic.  MMM  Neck: Neck supple.  Cardiovascular: Normal rate, regular rhythm and intact distal pulses.   Pulmonary/Chest: Effort normal and breath sounds normal. No respiratory distress. She has no wheezes. She has no rales.  Abdominal: Soft. Bowel sounds are normal. She exhibits no distension. There is no tenderness. There is no rebound.  Lymphadenopathy:    She has no cervical adenopathy.  Neurological: She is alert and oriented to person, place, and time.  Skin: Skin is warm and dry.  Vitals reviewed.         Assessment & Plan:  abd pain/cramping- new.  sxs started ~2 weeks ago after eating quinoa.  Reports sxs are intermittent but typically having pain and loose stools 15-20 minutes after eating.  Check labs to r/o electrolyte disturbance or infxn.  Increase fluids.  Start bentyl for likely IBS.  If no improvement, will need GI referral.  Reviewed supportive care and red flags that should prompt return.  Pt expressed understanding and is in agreement w/ plan.

## 2016-06-21 ENCOUNTER — Other Ambulatory Visit: Payer: Self-pay | Admitting: Family Medicine

## 2016-06-21 ENCOUNTER — Other Ambulatory Visit: Payer: 59

## 2016-06-21 DIAGNOSIS — R197 Diarrhea, unspecified: Secondary | ICD-10-CM

## 2016-06-21 NOTE — Addendum Note (Signed)
Addended by: Caffie Pinto on: 06/21/2016 04:21 PM   Modules accepted: Orders

## 2016-06-22 ENCOUNTER — Encounter: Payer: Self-pay | Admitting: Family Medicine

## 2016-06-22 LAB — CLOSTRIDIUM DIFFICILE BY PCR: CDIFFPCR: NOT DETECTED

## 2016-06-25 ENCOUNTER — Encounter: Payer: Self-pay | Admitting: Family Medicine

## 2016-06-25 LAB — STOOL CULTURE

## 2016-08-16 LAB — OB RESULTS CONSOLE ANTIBODY SCREEN: Antibody Screen: NEGATIVE

## 2016-08-16 LAB — OB RESULTS CONSOLE GC/CHLAMYDIA
CHLAMYDIA, DNA PROBE: NEGATIVE
Gonorrhea: NEGATIVE

## 2016-08-16 LAB — OB RESULTS CONSOLE ABO/RH: RH TYPE: POSITIVE

## 2016-08-16 LAB — OB RESULTS CONSOLE RUBELLA ANTIBODY, IGM: Rubella: IMMUNE

## 2016-08-16 LAB — OB RESULTS CONSOLE HIV ANTIBODY (ROUTINE TESTING): HIV: NONREACTIVE

## 2016-08-16 LAB — OB RESULTS CONSOLE RPR: RPR: NONREACTIVE

## 2016-08-16 LAB — OB RESULTS CONSOLE HEPATITIS B SURFACE ANTIGEN: Hepatitis B Surface Ag: NEGATIVE

## 2016-11-28 ENCOUNTER — Encounter (HOSPITAL_COMMUNITY): Payer: Self-pay | Admitting: *Deleted

## 2016-11-28 ENCOUNTER — Inpatient Hospital Stay (HOSPITAL_COMMUNITY)
Admission: AD | Admit: 2016-11-28 | Discharge: 2016-11-28 | Disposition: A | Discharge status: Home / Self Care | Payer: 59 | Source: Ambulatory Visit | Attending: Obstetrics and Gynecology | Admitting: Obstetrics and Gynecology

## 2016-11-28 DIAGNOSIS — J45909 Unspecified asthma, uncomplicated: Secondary | ICD-10-CM | POA: Diagnosis not present

## 2016-11-28 DIAGNOSIS — W19XXXA Unspecified fall, initial encounter: Secondary | ICD-10-CM | POA: Diagnosis not present

## 2016-11-28 DIAGNOSIS — G43909 Migraine, unspecified, not intractable, without status migrainosus: Secondary | ICD-10-CM | POA: Diagnosis not present

## 2016-11-28 DIAGNOSIS — R109 Unspecified abdominal pain: Secondary | ICD-10-CM | POA: Diagnosis not present

## 2016-11-28 DIAGNOSIS — O99512 Diseases of the respiratory system complicating pregnancy, second trimester: Secondary | ICD-10-CM | POA: Diagnosis not present

## 2016-11-28 DIAGNOSIS — O9A212 Injury, poisoning and certain other consequences of external causes complicating pregnancy, second trimester: Secondary | ICD-10-CM | POA: Diagnosis present

## 2016-11-28 DIAGNOSIS — O26892 Other specified pregnancy related conditions, second trimester: Secondary | ICD-10-CM | POA: Diagnosis not present

## 2016-11-28 DIAGNOSIS — O99352 Diseases of the nervous system complicating pregnancy, second trimester: Secondary | ICD-10-CM | POA: Insufficient documentation

## 2016-11-28 DIAGNOSIS — Z3402 Encounter for supervision of normal first pregnancy, second trimester: Secondary | ICD-10-CM

## 2016-11-28 DIAGNOSIS — Z79899 Other long term (current) drug therapy: Secondary | ICD-10-CM | POA: Diagnosis not present

## 2016-11-28 DIAGNOSIS — T1490XA Injury, unspecified, initial encounter: Secondary | ICD-10-CM | POA: Diagnosis present

## 2016-11-28 DIAGNOSIS — Z3A26 26 weeks gestation of pregnancy: Secondary | ICD-10-CM | POA: Diagnosis present

## 2016-11-28 NOTE — MAU Provider Note (Signed)
History     CSN: [id number]  Arrival date and time: 11/28/16 1454   First Provider Initiated Contact with Patient 11/28/16 1601      Chief Complaint  Patient presents with  . Fall   Caitlin Duarte is a 30 y.o. G2P1001 at [redacted]w[redacted]d presenting after a fall at 1300 today. She states she was walking up wooden steps and tripped on a nail landing on her left hand and left abdomen.The left abdomen feels sore. She was initially feeling less fetal movement than usual, but now perceives normal amount of fetal movement. She felt abdominal tightening 2 or 3 times since the fall. Now not feeling contractions. No vaginal bleeding or leakage of fluid.     OB History  Gravida Para Term Preterm AB Living  2 1 1     1   SAB TAB Ectopic Multiple Live Births        0 1    # Outcome Date GA Lbr Len/2nd Weight Sex Delivery Anes PTL Lv  2 Current           1 Term 01/03/15 [redacted]w[redacted]d 11:29 / 02:06 7 lb 10.1 oz (3.46 kg) M Vag-Spont EPI  LIV       Past Medical History:  Diagnosis Date  . Asthma   . Migraines     Past Surgical History:  Procedure Laterality Date  . APPENDECTOMY    . EYE SURGERY    . NOSE SURGERY  2006  . SINUS SURGERY WITH INSTATRAK    . WISDOM TOOTH EXTRACTION  2006    Family History  Problem Relation Age of Onset  . Anemia Sister   . Thyroid disease Sister   . Diabetes Paternal Grandmother   . Hypertension Paternal Grandmother   . Diabetes Paternal Grandfather   . Hypertension Paternal Grandfather   . Hypertension Father   . Diabetes Father   . Alzheimer's disease Maternal Grandfather   . Stroke Maternal Grandmother     Social History  Substance Use Topics  . Smoking status: Never Smoker  . Smokeless tobacco: Never Used  . Alcohol use Yes     Comment: socially    Allergies:  Allergies  Allergen Reactions  . Other Anaphylaxis and Shortness Of Breath    curry    Prescriptions Prior to Admission  Medication Sig Dispense Refill Last Dose  . calcium carbonate  (TUMS - DOSED IN MG ELEMENTAL CALCIUM) 500 MG chewable tablet Chew 1 tablet by mouth daily.   11/27/2016 at Unknown time  . cetirizine (ZYRTEC) 10 MG tablet Take 10 mg by mouth daily.   11/27/2016 at Unknown time  . Doxylamine-Pyridoxine (DICLEGIS PO) Take by mouth.   11/28/2016 at Unknown time  . Prenatal Vit-Fe Fumarate-FA (PRENATAL MULTIVITAMIN) TABS tablet Take 1 tablet by mouth daily at 12 noon.   11/27/2016 at Unknown time  . dicyclomine (BENTYL) 20 MG tablet Take 1 tablet (20 mg total) by mouth 4 (four) times daily -  before meals and at bedtime. (Patient not taking: Reported on 11/28/2016) 90 tablet 1 Not Taking at Unknown time    Review of Systems  Constitutional: Negative for fever.  Cardiovascular: Negative for chest pain.  Gastrointestinal: Positive for abdominal pain.       Mild soreness left lower quadrant/left flank  Genitourinary: Negative for dysuria and hematuria.  Musculoskeletal: Negative for back pain.  Neurological: Negative for light-headedness.  Psychiatric/Behavioral: The patient is not nervous/anxious.    Physical Exam   Blood pressure 125/77, pulse 100,  temperature 98.6 F (37 C), resp. rate 18, height 5\' 4"  (1.626 m), weight 148 lb (67.1 kg), currently breastfeeding.  Physical Exam  Nursing note and vitals reviewed. Constitutional: She is oriented to person, place, and time. She appears well-developed and well-nourished. No distress.  HENT:  Head: Normocephalic.  Eyes: No scleral icterus.  Neck: Neck supple.  Cardiovascular: Normal rate.   Respiratory: Effort normal.  GI: Soft. There is no tenderness.  S=D No skin disruption or discoloration noted. No tenderness to palpation  Musculoskeletal: Normal range of motion.  Neurological: She is alert and oriented to person, place, and time.  Skin: Skin is warm and dry.  Psychiatric: She has a normal mood and affect. Her behavior is normal.    MAU Course  Procedures No results found for this or any previous  visit (from the past 24 hour(s)).  Fetal Monitoring : assessing Care assumed by Debroah Baller NP at 1800    Assessment and [place] 11/28/2016, 4:04 PM   7:10 PM Patient spoke with RN and request to leave. RN called Dr. Rogue Bussing and she reviewed the monitor tracing and patient can be d/c home. Written instructions given to the patient concerning second trimester pregnancy and follow up.

## 2016-11-28 NOTE — Progress Notes (Signed)
DConard Novak CNM in to see pt

## 2016-11-28 NOTE — Progress Notes (Signed)
Transducer adjusted 

## 2016-11-28 NOTE — MAU Note (Addendum)
[place] going up steps about 1300. Slipped and fell to L side side. Caught myself but did hit left side of stomach on step. Denies bleeding or LOF. Some FM but not as much as usual since falll. Also has felt some tightening in abdomen since then

## 2016-11-28 NOTE — Discharge Instructions (Signed)
Return for any problems

## 2016-12-07 ENCOUNTER — Encounter: Payer: Self-pay | Admitting: Family Medicine

## 2016-12-07 ENCOUNTER — Ambulatory Visit (INDEPENDENT_AMBULATORY_CARE_PROVIDER_SITE_OTHER): Payer: 59 | Admitting: Family Medicine

## 2016-12-07 DIAGNOSIS — Z0000 Encounter for general adult medical examination without abnormal findings: Secondary | ICD-10-CM | POA: Diagnosis not present

## 2016-12-07 NOTE — Patient Instructions (Signed)
Follow up in 1 year or as needed Keep up the good work you look great! Call with any questions or concerns CONGRATS!!!

## 2016-12-07 NOTE — Assessment & Plan Note (Signed)
Pt's PE WNL w/ gravid uterus.  UTD on Tdap- will get at next OB visit as well as flu shot.  No need for labs given pregnant state and recent OB panel.  Anticipatory guidance provided.

## 2016-12-07 NOTE — Progress Notes (Signed)
Pre visit review using our clinic review tool, if applicable. No additional management support is needed unless otherwise documented below in the visit note. 

## 2016-12-07 NOTE — Progress Notes (Signed)
   Subjective:    Patient ID: Caitlin Duarte, female    DOB: [birth year], 31 y.o.   MRN: [id number]  HPI CPE- UTD on Tdap, pap.  Pt is currently pregnant.  No concerns today.   Review of Systems Patient reports no vision/ hearing changes, adenopathy,fever, weight change,  persistant/recurrent hoarseness , swallowing issues, chest pain, palpitations, edema, persistant/recurrent cough, hemoptysis, dyspnea (rest/exertional/paroxysmal nocturnal), gastrointestinal bleeding (melena, rectal bleeding), abdominal pain, significant heartburn, bowel changes, GU symptoms (dysuria, hematuria, incontinence), Gyn symptoms (abnormal  bleeding, pain),  syncope, focal weakness, memory loss, numbness & tingling, skin/hair/nail changes, abnormal bruising or bleeding, anxiety, or depression.     Objective:   Physical Exam General Appearance:    Alert, cooperative, no distress, appears stated age  Head:    Normocephalic, without obvious abnormality, atraumatic  Eyes:    PERRL, conjunctiva/corneas clear, EOM's intact, fundi    benign, both eyes  Ears:    Normal TM's and external ear canals, both ears  Nose:   Nares normal, septum midline, mucosa normal, no drainage    or sinus tenderness  Throat:   Lips, mucosa, and tongue normal; teeth and gums normal  Neck:   Supple, symmetrical, trachea midline, no adenopathy;    Thyroid: no enlargement/tenderness/nodules  Back:     Symmetric, no curvature, ROM normal, no CVA tenderness  Lungs:     Clear to auscultation bilaterally, respirations unlabored  Chest Wall:    No tenderness or deformity   Heart:    Regular rate and rhythm, S1 and S2 normal, no murmur, rub   or gallop  Breast Exam:    Deferred to GYN  Abdomen:     gravid  Genitalia:    Deferred to GYN  Rectal:    Extremities:   Extremities normal, atraumatic, no cyanosis or edema  Pulses:   2+ and symmetric all extremities  Skin:   Skin color, texture, turgor normal, no rashes or lesions  Lymph nodes:   Cervical,  supraclavicular, and axillary nodes normal  Neurologic:   CNII-XII intact, normal strength, sensation and reflexes    throughout          Assessment & Plan:

## 2017-01-30 LAB — OB RESULTS CONSOLE GBS: STREP GROUP B AG: POSITIVE

## 2017-02-25 ENCOUNTER — Encounter (HOSPITAL_COMMUNITY): Payer: Self-pay | Admitting: *Deleted

## 2017-02-25 ENCOUNTER — Telehealth (HOSPITAL_COMMUNITY): Payer: Self-pay | Admitting: *Deleted

## 2017-02-25 ENCOUNTER — Other Ambulatory Visit: Payer: Self-pay | Admitting: Obstetrics and Gynecology

## 2017-02-25 NOTE — Telephone Encounter (Signed)
Preadmission screen  

## 2017-02-27 ENCOUNTER — Inpatient Hospital Stay (HOSPITAL_COMMUNITY)
Admission: RE | Admit: 2017-02-27 | Discharge: 2017-02-28 | DRG: 807 | Disposition: A | Discharge status: Home / Self Care | Payer: 59 | Source: Ambulatory Visit | Attending: Obstetrics and Gynecology | Admitting: Obstetrics and Gynecology

## 2017-02-27 ENCOUNTER — Inpatient Hospital Stay (HOSPITAL_COMMUNITY): Payer: 59 | Admitting: Anesthesiology

## 2017-02-27 ENCOUNTER — Encounter (HOSPITAL_COMMUNITY): Payer: Self-pay

## 2017-02-27 DIAGNOSIS — O99824 Streptococcus B carrier state complicating childbirth: Secondary | ICD-10-CM | POA: Diagnosis not present

## 2017-02-27 DIAGNOSIS — Z3490 Encounter for supervision of normal pregnancy, unspecified, unspecified trimester: Secondary | ICD-10-CM | POA: Diagnosis present

## 2017-02-27 DIAGNOSIS — Z3A39 39 weeks gestation of pregnancy: Secondary | ICD-10-CM

## 2017-02-27 DIAGNOSIS — Z3483 Encounter for supervision of other normal pregnancy, third trimester: Secondary | ICD-10-CM | POA: Diagnosis not present

## 2017-02-27 LAB — CBC
HCT: 38.7 % (ref 36.0–46.0)
HEMOGLOBIN: 13.2 g/dL (ref 12.0–15.0)
MCH: 31.5 pg (ref 26.0–34.0)
MCHC: 34.1 g/dL (ref 30.0–36.0)
MCV: 92.4 fL (ref 78.0–100.0)
Platelets: 235 10*3/uL (ref 150–400)
RBC: 4.19 MIL/uL (ref 3.87–5.11)
RDW: 13.6 % (ref 11.5–15.5)
WBC: 9.1 10*3/uL (ref 4.0–10.5)

## 2017-02-27 LAB — TYPE AND SCREEN
ABO/RH(D): O POS
ANTIBODY SCREEN: NEGATIVE

## 2017-02-27 LAB — RPR: RPR Ser Ql: NONREACTIVE

## 2017-02-27 MED ORDER — IBUPROFEN 600 MG PO TABS
600.0000 mg | ORAL_TABLET | Freq: Four times a day (QID) | ORAL | Status: DC
  Administered 2017-02-27 – 2017-02-28 (×4): 600 mg via ORAL
  Filled 2017-02-27 (×4): qty 1

## 2017-02-27 MED ORDER — METHYLERGONOVINE MALEATE 0.2 MG/ML IJ SOLN: 0.2000 mg | INTRAMUSCULAR | Status: DC | PRN

## 2017-02-27 MED ORDER — PHENYLEPHRINE 40 MCG/ML (10ML) SYRINGE FOR IV PUSH (FOR BLOOD PRESSURE SUPPORT)
80.0000 ug | PREFILLED_SYRINGE | INTRAVENOUS | Status: DC | PRN
  Filled 2017-02-27: qty 5
  Filled 2017-02-27: qty 10

## 2017-02-27 MED ORDER — LACTATED RINGERS IV SOLN: INTRAVENOUS | Status: DC

## 2017-02-27 MED ORDER — OXYCODONE-ACETAMINOPHEN 5-325 MG PO TABS
2.0000 | ORAL_TABLET | ORAL | Status: DC | PRN
  Filled 2017-02-27: qty 2

## 2017-02-27 MED ORDER — LIDOCAINE HCL (PF) 1 % IJ SOLN
INTRAMUSCULAR | Status: DC | PRN
  Administered 2017-02-27 (×2): 5 mL

## 2017-02-27 MED ORDER — OXYCODONE-ACETAMINOPHEN 5-325 MG PO TABS: 1.0000 | ORAL_TABLET | ORAL | Status: DC | PRN

## 2017-02-27 MED ORDER — LACTATED RINGERS IV SOLN
500.0000 mL | Freq: Once | INTRAVENOUS | Status: AC
  Administered 2017-02-27: 1000 mL via INTRAVENOUS

## 2017-02-27 MED ORDER — OXYTOCIN BOLUS FROM INFUSION: 500.0000 mL | Freq: Once | INTRAVENOUS | Status: DC

## 2017-02-27 MED ORDER — PHENYLEPHRINE 40 MCG/ML (10ML) SYRINGE FOR IV PUSH (FOR BLOOD PRESSURE SUPPORT)
80.0000 ug | PREFILLED_SYRINGE | INTRAVENOUS | Status: DC | PRN
  Filled 2017-02-27: qty 5

## 2017-02-27 MED ORDER — EPHEDRINE 5 MG/ML INJ
10.0000 mg | INTRAVENOUS | Status: DC | PRN
  Filled 2017-02-27: qty 2

## 2017-02-27 MED ORDER — LIDOCAINE HCL (PF) 1 % IJ SOLN
30.0000 mL | INTRAMUSCULAR | Status: DC | PRN
  Filled 2017-02-27: qty 30

## 2017-02-27 MED ORDER — TETANUS-DIPHTH-ACELL PERTUSSIS 5-2.5-18.5 LF-MCG/0.5 IM SUSP: 0.5000 mL | Freq: Once | INTRAMUSCULAR | Status: DC

## 2017-02-27 MED ORDER — DIBUCAINE 1 % RE OINT: 1.0000 "application " | TOPICAL_OINTMENT | RECTAL | Status: DC | PRN

## 2017-02-27 MED ORDER — BUTORPHANOL TARTRATE 1 MG/ML IJ SOLN: 1.0000 mg | INTRAMUSCULAR | Status: DC | PRN

## 2017-02-27 MED ORDER — FENTANYL 2.5 MCG/ML BUPIVACAINE 1/10 % EPIDURAL INFUSION (WH - ANES)
14.0000 mL/h | INTRAMUSCULAR | Status: DC | PRN
  Administered 2017-02-27: 14 mL/h via EPIDURAL
  Filled 2017-02-27: qty 100

## 2017-02-27 MED ORDER — PENICILLIN G POTASSIUM 5000000 UNITS IJ SOLR
5.0000 10*6.[IU] | Freq: Once | INTRAVENOUS | Status: AC
  Administered 2017-02-27: 5 10*6.[IU] via INTRAVENOUS
  Filled 2017-02-27: qty 5

## 2017-02-27 MED ORDER — ZOLPIDEM TARTRATE 5 MG PO TABS: 5.0000 mg | ORAL_TABLET | Freq: Every evening | ORAL | Status: DC | PRN

## 2017-02-27 MED ORDER — BENZOCAINE-MENTHOL 20-0.5 % EX AERO
1.0000 "application " | INHALATION_SPRAY | CUTANEOUS | Status: DC | PRN
  Filled 2017-02-27: qty 56

## 2017-02-27 MED ORDER — METHYLERGONOVINE MALEATE 0.2 MG PO TABS: 0.2000 mg | ORAL_TABLET | ORAL | Status: DC | PRN

## 2017-02-27 MED ORDER — SIMETHICONE 80 MG PO CHEW: 80.0000 mg | CHEWABLE_TABLET | ORAL | Status: DC | PRN

## 2017-02-27 MED ORDER — SENNOSIDES-DOCUSATE SODIUM 8.6-50 MG PO TABS
2.0000 | ORAL_TABLET | ORAL | Status: DC
  Administered 2017-02-27: 2 via ORAL
  Filled 2017-02-27: qty 2

## 2017-02-27 MED ORDER — ONDANSETRON HCL 4 MG PO TABS: 4.0000 mg | ORAL_TABLET | ORAL | Status: DC | PRN

## 2017-02-27 MED ORDER — ACETAMINOPHEN 325 MG PO TABS: 650.0000 mg | ORAL_TABLET | ORAL | Status: DC | PRN

## 2017-02-27 MED ORDER — SOD CITRATE-CITRIC ACID 500-334 MG/5ML PO SOLN: 30.0000 mL | ORAL | Status: DC | PRN

## 2017-02-27 MED ORDER — PRENATAL MULTIVITAMIN CH
1.0000 | ORAL_TABLET | Freq: Every day | ORAL | Status: DC
  Administered 2017-02-28: 1 via ORAL
  Filled 2017-02-27: qty 1

## 2017-02-27 MED ORDER — OXYTOCIN 40 UNITS IN LACTATED RINGERS INFUSION - SIMPLE MED
1.0000 m[IU]/min | INTRAVENOUS | Status: DC
  Administered 2017-02-27: 2 m[IU]/min via INTRAVENOUS
  Filled 2017-02-27: qty 1000

## 2017-02-27 MED ORDER — LACTATED RINGERS IV SOLN: 500.0000 mL | INTRAVENOUS | Status: DC | PRN

## 2017-02-27 MED ORDER — OXYTOCIN 40 UNITS IN LACTATED RINGERS INFUSION - SIMPLE MED: 2.5000 [IU]/h | INTRAVENOUS | Status: DC

## 2017-02-27 MED ORDER — ONDANSETRON HCL 4 MG/2ML IJ SOLN: 4.0000 mg | Freq: Four times a day (QID) | INTRAMUSCULAR | Status: DC | PRN

## 2017-02-27 MED ORDER — WITCH HAZEL-GLYCERIN EX PADS: 1.0000 "application " | MEDICATED_PAD | CUTANEOUS | Status: DC | PRN

## 2017-02-27 MED ORDER — PENICILLIN G POT IN DEXTROSE 60000 UNIT/ML IV SOLN
3.0000 10*6.[IU] | INTRAVENOUS | Status: DC
  Filled 2017-02-27 (×4): qty 50

## 2017-02-27 MED ORDER — TERBUTALINE SULFATE 1 MG/ML IJ SOLN
0.2500 mg | Freq: Once | INTRAMUSCULAR | Status: DC | PRN
  Filled 2017-02-27: qty 1

## 2017-02-27 MED ORDER — DIPHENHYDRAMINE HCL 50 MG/ML IJ SOLN: 12.5000 mg | INTRAMUSCULAR | Status: DC | PRN

## 2017-02-27 MED ORDER — OXYCODONE-ACETAMINOPHEN 5-325 MG PO TABS: 2.0000 | ORAL_TABLET | ORAL | Status: DC | PRN

## 2017-02-27 MED ORDER — ACETAMINOPHEN 325 MG PO TABS
650.0000 mg | ORAL_TABLET | ORAL | Status: DC | PRN
  Administered 2017-02-27 – 2017-02-28 (×3): 650 mg via ORAL
  Filled 2017-02-27 (×3): qty 2

## 2017-02-27 MED ORDER — COCONUT OIL OIL
1.0000 "application " | TOPICAL_OIL | Status: DC | PRN
  Administered 2017-02-28: 1 via TOPICAL
  Filled 2017-02-27: qty 120

## 2017-02-27 MED ORDER — ONDANSETRON HCL 4 MG/2ML IJ SOLN: 4.0000 mg | INTRAMUSCULAR | Status: DC | PRN

## 2017-02-27 MED ORDER — DIPHENHYDRAMINE HCL 25 MG PO CAPS: 25.0000 mg | ORAL_CAPSULE | Freq: Four times a day (QID) | ORAL | Status: DC | PRN

## 2017-02-27 NOTE — Anesthesia Preprocedure Evaluation (Signed)

## 2017-02-27 NOTE — H&P (Signed)
Caitlin Duarte is a 31 y.o. female presenting for elective IOL  31 yo G2P1001 @ 39+0 presents for elective IOL. Her pregnancy has been uncomplicated to this ppoint OB History    Gravida Para Term Preterm AB Living   2 1 1     1    SAB TAB Ectopic Multiple Live Births         0 1     Past Medical History:  Diagnosis Date  . Asthma   . Migraines    Past Surgical History:  Procedure Laterality Date  . APPENDECTOMY    . EYE SURGERY    . NOSE SURGERY  2006  . SINUS SURGERY WITH INSTATRAK    . WISDOM TOOTH EXTRACTION  2006   Family History: family history includes Alzheimer's disease in her maternal grandfather; Anemia in her sister; Diabetes in her father, paternal grandfather, and paternal grandmother; Hypertension in her father, paternal grandfather, and paternal grandmother; Stroke in her maternal grandfather; Thyroid disease in her sister. Social History:  reports that  has never smoked. she has never used smokeless tobacco. She reports that she drinks alcohol. She reports that she does not use drugs.     Maternal Diabetes: No Genetic Screening: Normal Maternal Ultrasounds/Referrals: Normal Fetal Ultrasounds or other Referrals:  None Maternal Substance Abuse:  No Significant Maternal Medications:  None Significant Maternal Lab Results:  None Other Comments:  None  ROS History Dilation: 10 Effacement (%): 100 Station: +2 Exam by:: m wilkins rnc Blood pressure (!) 75/58, pulse 96, resp. rate 16, height 5\' 4"  (1.626 m), weight 74.4 kg (164 lb). Exam Physical Exam  Prenatal labs: ABO, Rh: --/--/O POS (12/19 0802) Antibody: NEG (12/19 0802) Rubella: Immune (06/07 0000) RPR: Non Reactive (12/19 0802)  HBsAg: Negative (06/07 0000)  HIV: Non-reactive (06/07 0000)  GBS: Positive (11/21 0000)   Assessment/Plan: 1) Admit 2) Epidural 3) PCN for GBS 4) Anticipate SVD   Vanessa Kick 02/27/2017, 3:15 PM

## 2017-02-27 NOTE — Anesthesia Procedure Notes (Signed)
Epidural Patient location during procedure: OB  Staffing Anesthesiologist: Montez Hageman, MD Performed: anesthesiologist   Preanesthetic Checklist Completed: patient identified, site marked, surgical consent, pre-op evaluation, timeout performed, IV checked, risks and benefits discussed and monitors and equipment checked  Epidural Patient position: sitting Prep: DuraPrep Patient monitoring: heart rate, continuous pulse ox and blood pressure Approach: midline Location: L3-L4 Injection technique: LOR saline  Needle:  Needle type: Tuohy  Needle gauge: 17 G Needle length: 9 cm and 9 Needle insertion depth: 5 cm Catheter type: closed end flexible Catheter size: 20 Guage Catheter at skin depth: 9 cm Test dose: negative  Assessment Events: blood not aspirated, injection not painful, no injection resistance, negative IV test and no paresthesia  Additional Notes Patient identified. Risks/Benefits/Options discussed with patient including but not limited to bleeding, infection, nerve damage, paralysis, failed block, incomplete pain control, headache, blood pressure changes, nausea, vomiting, reactions to medication both or allergic, itching and postpartum back pain. Confirmed with bedside nurse the patient's most recent platelet count. Confirmed with patient that they are not currently taking any anticoagulation, have any bleeding history or any family history of bleeding disorders. Patient expressed understanding and wished to proceed. All questions were answered. Sterile technique was used throughout the entire procedure. Please see nursing notes for vital signs. Test dose was given through epidural needle and negative prior to continuing to dose epidural or start infusion. Warning signs of high block given to the patient including shortness of breath, tingling/numbness in hands, complete motor block, or any concerning symptoms with instructions to call for help. Patient was given  instructions on fall risk and not to get out of bed. All questions and concerns addressed with instructions to call with any issues.

## 2017-02-27 NOTE — Anesthesia Pain Management Evaluation Note (Signed)
  CRNA Pain Management Visit Note  Patient: Caitlin Duarte, 31 y.o., female  "Hello I am a member of the anesthesia team at [facility]. We have an anesthesia team available at all times to provide care throughout the hospital, including epidural management and anesthesia for C-section. I don't know your plan for the delivery whether it a natural birth, water birth, IV sedation, nitrous supplementation, doula or epidural, but we want to meet your pain goals."   1.Was your pain managed to your expectations on prior hospitalizations?   Yes   2.What is your expectation for pain management during this hospitalization?     Epidural  3.How can we help you reach that goal?   Record the patient's initial score and the patient's pain goal.   Pain: 0  Pain Goal: 5 [facility] wants you to be able to say your pain was always managed very well.  Jabier Mutton 02/27/2017

## 2017-02-28 ENCOUNTER — Encounter (HOSPITAL_COMMUNITY): Payer: Self-pay

## 2017-02-28 LAB — CBC
HEMATOCRIT: 35.6 % — AB (ref 36.0–46.0)
HEMOGLOBIN: 12 g/dL (ref 12.0–15.0)
MCH: 30.6 pg (ref 26.0–34.0)
MCHC: 33.7 g/dL (ref 30.0–36.0)
MCV: 90.8 fL (ref 78.0–100.0)
Platelets: 212 10*3/uL (ref 150–400)
RBC: 3.92 MIL/uL (ref 3.87–5.11)
RDW: 13.5 % (ref 11.5–15.5)
WBC: 12.5 10*3/uL — AB (ref 4.0–10.5)

## 2017-02-28 LAB — BIRTH TISSUE RECOVERY COLLECTION (PLACENTA DONATION)

## 2017-02-28 MED ORDER — IBUPROFEN 600 MG PO TABS: 600.0000 mg | ORAL_TABLET | Freq: Four times a day (QID) | ORAL | 0 refills | Status: DC

## 2017-02-28 NOTE — Discharge Summary (Signed)
Obstetric Discharge Summary Reason for Admission: induction of labor Prenatal Procedures: none Intrapartum Procedures: spontaneous vaginal delivery Postpartum Procedures: none Complications-Operative and Postpartum: 2 degree perineal laceration Hemoglobin  Date Value Ref Range Status  02/28/2017 12.0 12.0 - 15.0 g/dL Final   Hemoglobin, fingerstick  Date Value Ref Range Status  10/27/2013 14.2 12.0 - 16.0 g/dL Final   HCT  Date Value Ref Range Status  02/28/2017 35.6 (L) 36.0 - 46.0 % Final    Physical Exam:  General: alert, cooperative and appears stated age 31: appropriate Uterine Fundus: firm DVT Evaluation: No evidence of DVT seen on physical exam.  Discharge Diagnoses: Term Pregnancy-delivered  Discharge Information: Date: 02/28/2017 Activity: pelvic rest Diet: routine Medications: PNV and Ibuprofen Condition: stable Instructions: refer to practice specific booklet Discharge to: home Follow-up Information    Vanessa Kick, MD Follow up in 4 week(s).   Specialty:  Obstetrics and Gynecology Contact information: [address] [place] [address] [phone]           Newborn Data: Live born female  Birth Weight: 7 lb 6 oz (3345 g) APGAR: 18, 9  Newborn Delivery   Birth date/time:  02/27/2017 14:50:00 Delivery type:  Vaginal, Spontaneous     Home with mother.  [place] 02/28/2017, 2:44 PM

## 2017-02-28 NOTE — Anesthesia Postprocedure Evaluation (Signed)
Anesthesia Post Note  Patient: [name]  Procedure(s) Performed: AN AD HOC LABOR EPIDURAL     Patient location during evaluation: Mother Baby Anesthesia Type: Epidural Level of consciousness: awake and alert Pain management: pain level controlled Vital Signs Assessment: post-procedure vital signs reviewed and stable Respiratory status: spontaneous breathing Cardiovascular status: stable Postop Assessment: no headache, adequate PO intake, no backache, patient able to bend at knees, epidural receding and no apparent nausea or vomiting Anesthetic complications: no    Last Vitals:  Vitals:   02/27/17 2310 02/28/17 0513  BP: 110/64 100/74  Pulse: 72 82  Resp: 18 18  Temp: 36.6 C 36.6 C  SpO2: 97% 98%    Last Pain:  Vitals:   02/28/17 0600  TempSrc:   PainSc: 2    Pain Goal:                 [name]

## 2017-02-28 NOTE — Progress Notes (Signed)
Patient is doing well.  She is ambulating, voiding, tolerating PO.  Pain control is good.  Lochia is appropriate  Vitals:   02/27/17 1725 02/27/17 1825 02/27/17 2310 02/28/17 0513  BP: 97/67 117/80 110/64 100/74  Pulse: 93 95 72 82  Resp: 18 18 18 18   Temp: 98.4 F (36.9 C) (!) 97.4 F (36.3 C) 97.9 F (36.6 C) 97.8 F (36.6 C)  TempSrc: Oral Axillary Oral Oral  SpO2: 100% 100% 97% 98%  Weight:      Height:        NAD Fundus firm Ext: none  Lab Results  Component Value Date   WBC 12.5 (H) 02/28/2017   HGB 12.0 02/28/2017   HCT 35.6 (L) 02/28/2017   MCV 90.8 02/28/2017   PLT 212 02/28/2017    --/--/O POS (12/19 0802)/RImmune  A/P 31 y.o. G2P1001 PPD#1. Routine care.   Meeting all goals--desires d/c to home today.  Desires circumcision. Discussed r/b/a of the procedure. Reviewed that circumcision is an elective surgical procedure and not considered medically necessary. Reviewed the risks of the procedure including the risk of infection, bleeding, damage to surrounding structures, including scrotum, shaft, urethra and head of penis, and an undesired cosmetic effect requiring additional procedures for revision. Consent signed.      [place]

## 2017-02-28 NOTE — Lactation Note (Signed)
This note was copied from a baby's chart. Lactation Consultation Note  Patient Name: Caitlin Duarte [facility] Date: 02/28/2017 Reason for consult: Follow-up assessment   Baby 70 hours old.  Family wants an early discharge.  Baby was circumcised today and is sleeping. Left [facility] phone number and suggest parents call when baby cues. Parents states latching has improved. Mom encouraged to feed baby 8-12 times/24 hours and with feeding cues.  Reviewed engorgement care and monitoring voids/stools.    Maternal Data    Feeding Feeding Type: Breast Fed Length of feed: 20 min  LATCH Score                   Interventions    Lactation Tools Discussed/Used     Consult Status Consult Status: Follow-up Date: 02/28/17 Follow-up type: In-patient    Vivianne Master [facility] 02/28/2017, 10:21 AM

## 2017-02-28 NOTE — Lactation Note (Signed)
This note was copied from a baby's chart. Lactation Consultation Note Experienced BF mom Bf her now 32 yr old for 1 yr. Stated almost got Mastitis once, it healed.  Mom has everted nipples. Hand expressed colostrum.  Mom had several questions to refresh memory w/newborn. Mom concerned about baby not BF d/t spitty. Mom stated baby didn't like the Lt. Football position.  Baby cueing. Assisted in [place]. Football position. Rearranged w/different pillows. Baby latched well.  Mom encouraged to feed baby 8-12 times/24 hours and with feeding cues. Newborn behavior, feeding habits, STS, I&O, cluster feeding, supply and demand discussed. Encouraged to call for assistance for question.  [place] brochure given w/resources, support groups and [facility] services.  Patient Name: Caitlin Duarte Date: 02/28/2017 Reason for consult: Initial assessment   Maternal Data Has patient been taught Hand Expression?: Yes Does the patient have breastfeeding experience prior to this delivery?: Yes  Feeding Feeding Type: Breast Fed Length of feed: 10 min(still BF)  LATCH Score Latch: Repeated attempts needed to sustain latch, nipple held in mouth throughout feeding, stimulation needed to elicit sucking reflex.  Audible Swallowing: A few with stimulation  Type of Nipple: Everted at rest and after stimulation  Comfort (Breast/Nipple): Soft / non-tender  Hold (Positioning): Assistance needed to correctly position infant at breast and maintain latch.  LATCH Score: 7  Interventions Interventions: Breast feeding basics reviewed;Breast compression;Assisted with latch;Adjust position;Skin to skin;Support pillows;Breast massage;Position options;Hand express  Lactation Tools Discussed/Used     Consult Status Consult Status: Follow-up Date: 03/01/17 Follow-up type: In-patient    Theodoro Kalata 02/28/2017, 5:43 AM

## 2017-07-18 IMAGING — US US MFM OB LIMITED
1 series · 15 of 26 positions shown · non-contrast
Comparison: none

[Series 1: us mfm ob limited · 26 acquisitions, 15 frames shown]
[im 1/26]
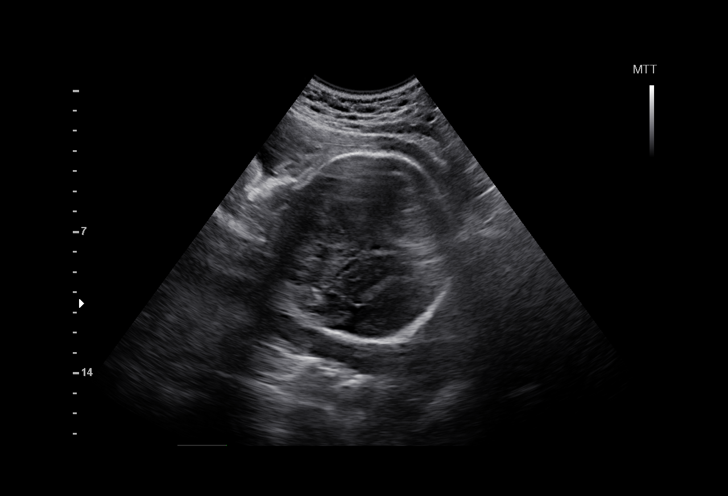
[im 3/26]
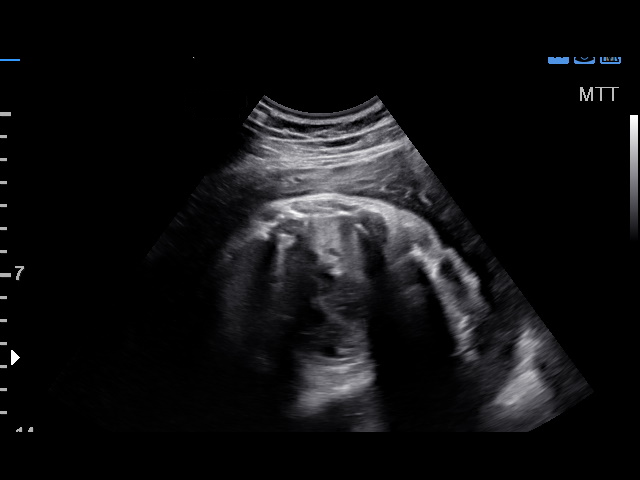
[im 5/26]
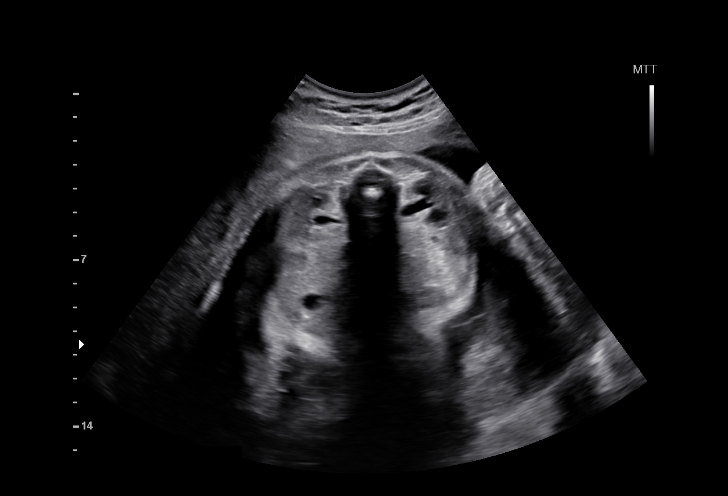
[im 7/26]
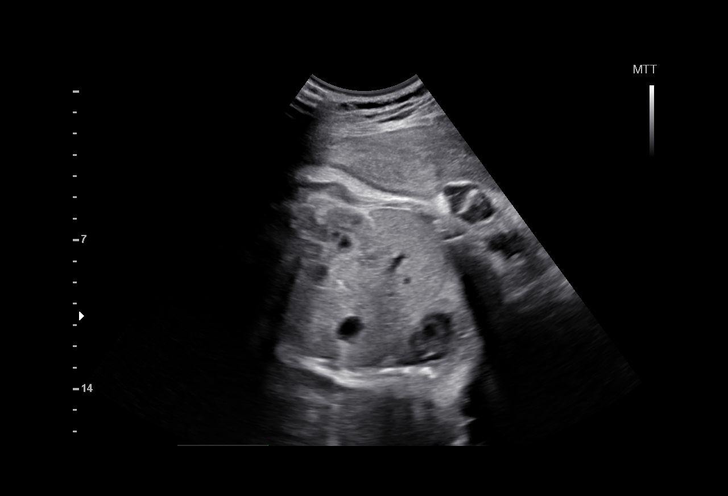
[im 8/26]
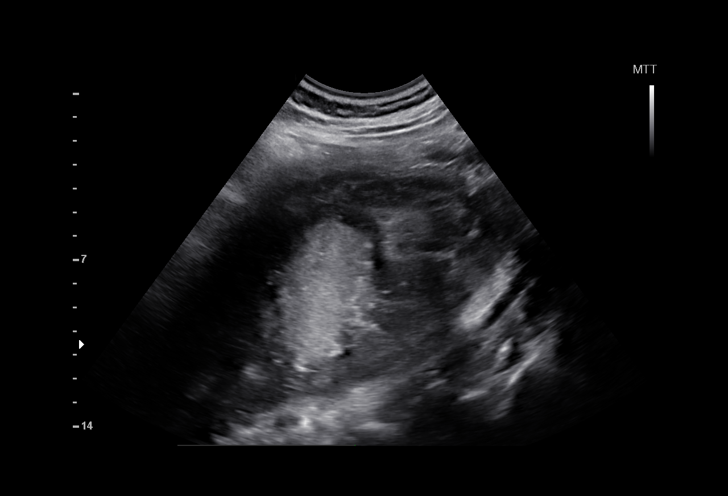
[im 10/26]
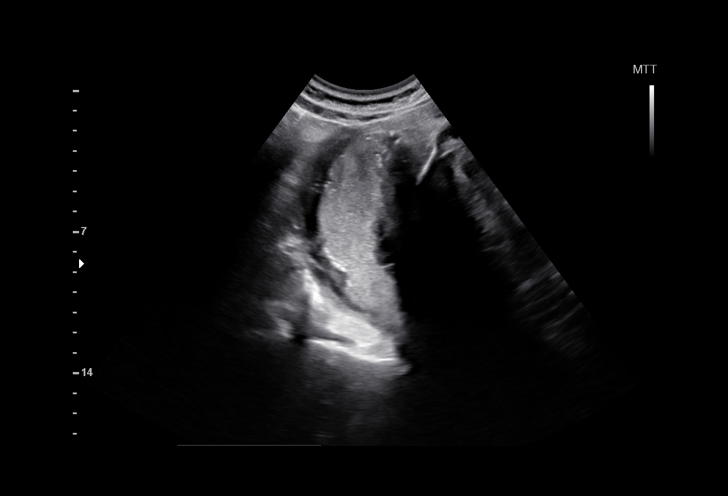
[im 12/26]
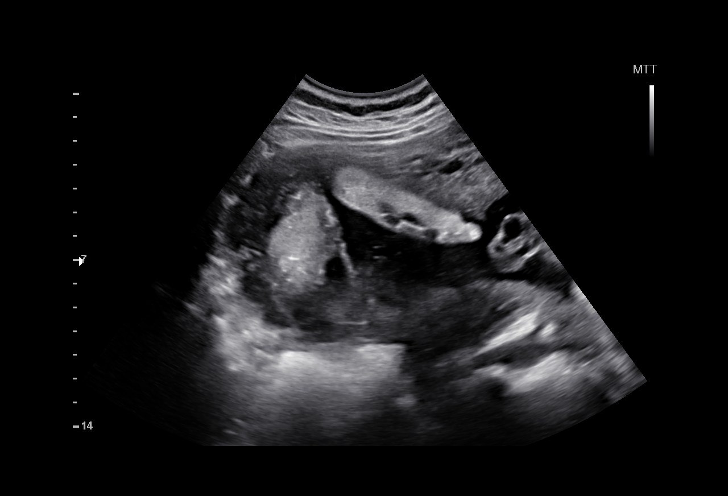
[im 14/26]
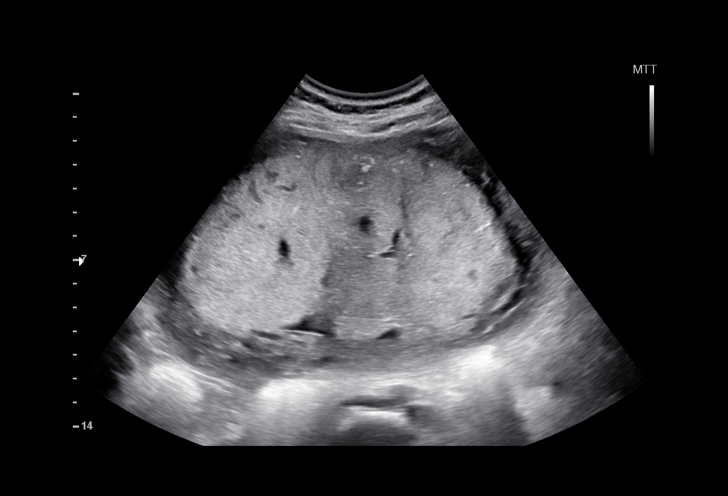
[im 15/26]
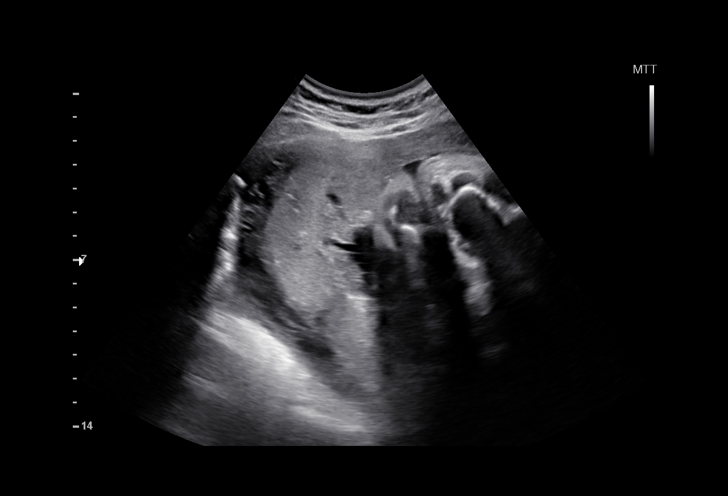
[im 17/26]
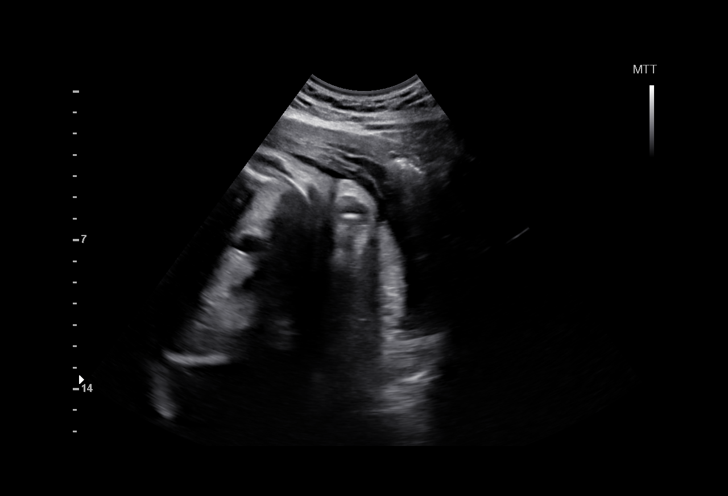
[im 19/26]
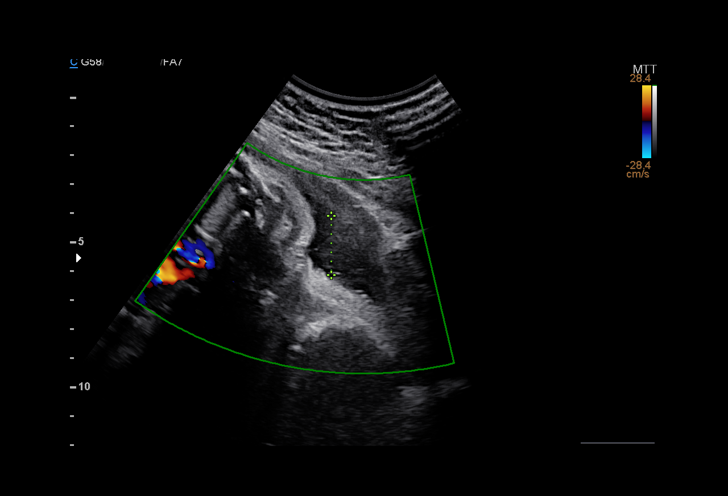
[im 20/26]
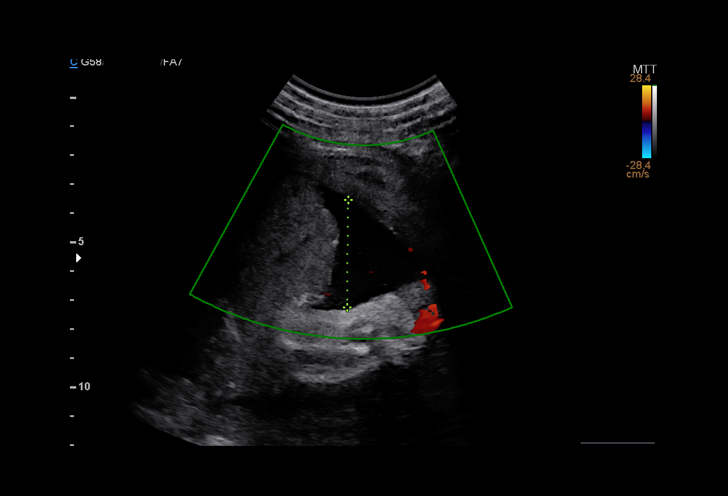
[im 22/26]
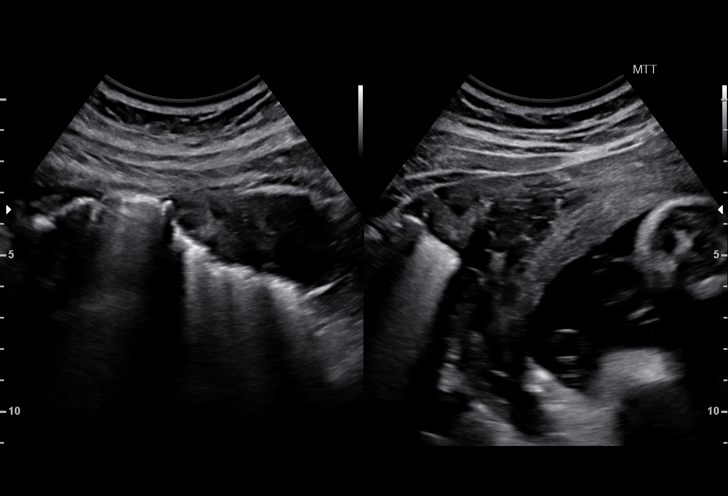
[im 24/26]
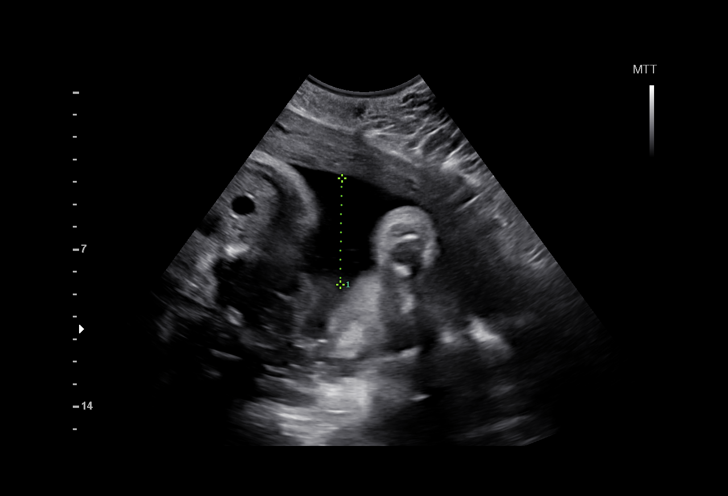
[im 26/26]
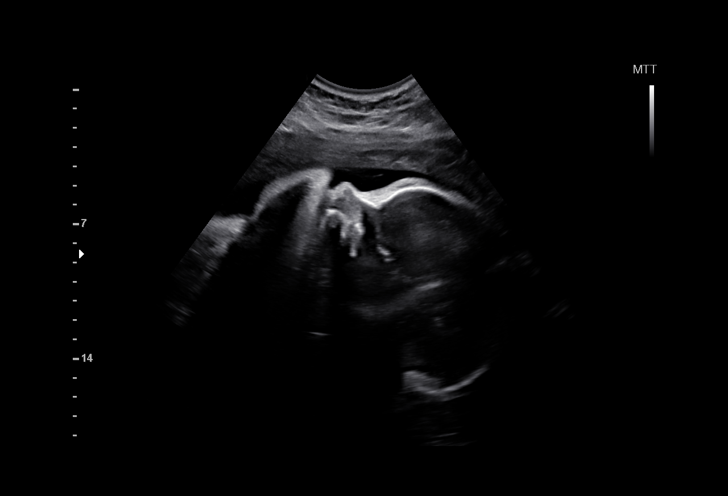

[15 of 26 positions shown; findings below may reference images not displayed]

OBSTETRICS REPORT
(Signed Final 12/01/2014 [DATE])

Service(s) Provided

US MFM OB LIMITED                                     76815.01
Indications

34 weeks gestation of pregnancy
Premature rupture of membranes - leaking fluid
Fetal Evaluation

Num Of Fetuses:    1
Fetal Heart Rate:  159                          bpm
Cardiac Activity:  Observed
Presentation:      Cephalic
Placenta:          Posterior, above cervical
os
P. Cord            Not well visualized
Insertion:

Amniotic Fluid
AFI FV:      Subjectively within normal limits
AFI Sum:     10.68    cm      25  %Tile      Larg Pckt:   3.71  cm
RUQ:   1.96    cm   RLQ:    2.97   cm    LUQ:    2.04   cm    LLQ:   3.71    cm
Gestational Age

LMP:           34w 5d        Date:  04/01/14                 EDD:    01/06/15
Best:          34w 5d     Det. By:  LMP  (04/01/14)          EDD:    01/06/15
Cervix Uterus Adnexa

Cervix:       Not visualized (advanced GA >09wks)
Uterus:       No abnormality visualized.
Cul De Sac:   No free fluid seen.
Left Ovary:    No adnexal mass visualized.
Right Ovary:   No adnexal mass visualized.

Adnexa:     No abnormality visualized.
Impression

SIUP at 34+5 weeks
Cephalic presentation
Normal amniotic fluid volume
Recommendations

Follow-up as clinically indicated

questions or concerns.

## 2017-08-02 DIAGNOSIS — J3501 Chronic tonsillitis: Secondary | ICD-10-CM | POA: Insufficient documentation

## 2017-08-02 DIAGNOSIS — J343 Hypertrophy of nasal turbinates: Secondary | ICD-10-CM | POA: Diagnosis not present

## 2017-12-24 ENCOUNTER — Ambulatory Visit (INDEPENDENT_AMBULATORY_CARE_PROVIDER_SITE_OTHER): Payer: Commercial Managed Care - PPO | Admitting: Family Medicine

## 2017-12-24 ENCOUNTER — Encounter: Payer: Self-pay | Admitting: Family Medicine

## 2017-12-24 ENCOUNTER — Other Ambulatory Visit: Payer: Self-pay

## 2017-12-24 VITALS — BP 122/86 | HR 113 | Temp 98.3°F | Ht 64.0 in | Wt 133.0 lb

## 2017-12-24 DIAGNOSIS — G43111 Migraine with aura, intractable, with status migrainosus: Secondary | ICD-10-CM | POA: Diagnosis not present

## 2017-12-24 MED ORDER — RIZATRIPTAN BENZOATE 10 MG PO TBDP: 10.0000 mg | ORAL_TABLET | ORAL | 6 refills | Status: DC | PRN

## 2017-12-24 MED ORDER — PROMETHAZINE HCL 25 MG PO TABS: 25.0000 mg | ORAL_TABLET | Freq: Four times a day (QID) | ORAL | 0 refills | Status: DC | PRN

## 2017-12-24 MED ORDER — PROMETHAZINE HCL 25 MG/ML IJ SOLN
25.0000 mg | Freq: Once | INTRAMUSCULAR | Status: AC
  Administered 2017-12-24: 25 mg via INTRAMUSCULAR

## 2017-12-24 MED ORDER — TRAMADOL HCL 50 MG PO TABS: 50.0000 mg | ORAL_TABLET | Freq: Four times a day (QID) | ORAL | 0 refills | Status: DC | PRN

## 2017-12-24 MED ORDER — KETOROLAC TROMETHAMINE 60 MG/2ML IM SOLN
60.0000 mg | Freq: Once | INTRAMUSCULAR | Status: AC
  Administered 2017-12-24: 60 mg via INTRAMUSCULAR

## 2017-12-24 NOTE — Patient Instructions (Signed)
Please return in 2 weeks with Dr. Birdie Riddle for headache follow up.  If you have any questions or concerns, please don't hesitate to send me a message via MyChart or call the office at [phone]. Thank you for visiting with [place] today! It's our pleasure caring for you.  Please pump and dump your breast mild for the next 6 hours.   If you have another migraine, you may use the phenergan and Maxalt. However, you should avoid breast feeding for 12 hours after these medications.    Migraine Headache A migraine headache is an intense, throbbing pain on one side or both sides of the head. Migraines may also cause other symptoms, such as nausea, vomiting, and sensitivity to light and noise. What are the causes? Doing or taking certain things may also trigger migraines, such as:  Alcohol.  Smoking.  Medicines, such as: ? Medicine used to treat chest pain (nitroglycerine). ? Birth control pills. ? Estrogen pills. ? Certain blood pressure medicines.  Aged cheeses, chocolate, or caffeine.  Foods or drinks that contain nitrates, glutamate, aspartame, or tyramine.  Physical activity.  Other things that may trigger a migraine include:  Menstruation.  Pregnancy.  Hunger.  Stress, lack of sleep, too much sleep, or fatigue.  Weather changes.  What increases the risk? The following factors may make you more likely to experience migraine headaches:  Age. Risk increases with age.  Family history of migraine headaches.  Being Caucasian.  Depression and anxiety.  Obesity.  Being a woman.  Having a hole in the heart (patent foramen ovale) or other heart problems.  What are the signs or symptoms? The main symptom of this condition is pulsating or throbbing pain. Pain may:  Happen in any area of the head, such as on one side or both sides.  Interfere with daily activities.  Get worse with physical activity.  Get worse with exposure to bright lights or loud noises.  Other  symptoms may include:  Nausea.  Vomiting.  Dizziness.  General sensitivity to bright lights, loud noises, or smells.  Before you get a migraine, you may get warning signs that a migraine is developing (aura). An aura may include:  Seeing flashing lights or having blind spots.  Seeing bright spots, halos, or zigzag lines.  Having tunnel vision or blurred vision.  Having numbness or a tingling feeling.  Having trouble talking.  Having muscle weakness.  How is this diagnosed? A migraine headache can be diagnosed based on:  Your symptoms.  A physical exam.  Tests, such as CT scan or MRI of the head. These imaging tests can help rule out other causes of headaches.  Taking fluid from the spine (lumbar puncture) and analyzing it (cerebrospinal fluid analysis, or CSF analysis).  How is this treated? A migraine headache is usually treated with medicines that:  Relieve pain.  Relieve nausea.  Prevent migraines from coming back.  Treatment may also include:  Acupuncture.  Lifestyle changes like avoiding foods that trigger migraines.  Follow these instructions at home: Medicines  Take over-the-counter and prescription medicines only as told by your health care provider.  Do not drive or use heavy machinery while taking prescription pain medicine.  To prevent or treat constipation while you are taking prescription pain medicine, your health care provider may recommend that you: ? Drink enough fluid to keep your urine clear or pale yellow. ? Take over-the-counter or prescription medicines. ? Eat foods that are high in fiber, such as fresh fruits and vegetables,  whole grains, and beans. ? Limit foods that are high in fat and processed sugars, such as fried and sweet foods. Lifestyle  Avoid alcohol use.  Do not use any products that contain nicotine or tobacco, such as cigarettes and e-cigarettes. If you need help quitting, ask your health care provider.  Get at  least 8 hours of sleep every night.  Limit your stress. General instructions   Keep a journal to find out what may trigger your migraine headaches. For example, write down: ? What you eat and drink. ? How much sleep you get. ? Any change to your diet or medicines.  If you have a migraine: ? Avoid things that make your symptoms worse, such as bright lights. ? It may help to lie down in a dark, quiet room. ? Do not drive or use heavy machinery. ? Ask your health care provider what activities are safe for you while you are experiencing symptoms.  Keep all follow-up visits as told by your health care provider. This is important. Contact a health care provider if:  You develop symptoms that are different or more severe than your usual migraine symptoms. Get help right away if:  Your migraine becomes severe.  You have a fever.  You have a stiff neck.  You have vision loss.  Your muscles feel weak or like you cannot control them.  You start to lose your balance often.  You develop trouble walking.  You faint. This information is not intended to replace advice given to you by your health care provider. Make sure you discuss any questions you have with your health care provider. Document Released: 02/26/2005 Document Revised: 09/16/2015 Document Reviewed: 08/15/2015 Elsevier Interactive Patient Education  2017 [employer].

## 2017-12-24 NOTE — Progress Notes (Signed)
Subjective  CC:  Chief Complaint  Patient presents with  . Migraine    x 2 days, nausea and vomitting associated with pain     HPI: Caitlin Duarte is a 32 y.o. female who presents to the office today to address the problems listed above in the chief complaint.  32 year old G1 P2-0-0-2, youngest child is 36 months old and she is currently breast-feeding him.  Presents due to her intractable migraine.  Started over 24 hours ago on right side.  Associated photophobia, phonophobia, nausea with vomiting, confusion and aura with left-sided visual changes that are typical for her headaches.  Her last migraine was about a month ago.  Prior to that they have been rare.  She recently was vacationing, unknown trigger.  She had used Maxalt in the past.  She has been using Excedrin Migraine for this headache without any results.  Could not sleep last night due to pain.  No neck stiffness, fever, diplopia, dysarthria, or paresis. Assessment  1. Intractable migraine with aura with status migrainosus   2. Breast feeding status of mother      Plan   Intractable migraine: Treated in the office with Toradol and Phenergan without significant relief of headache.  Sent home with Zofran and tramadol.  However, if headache does not lessen in the next 2 hours, educated that she needs to go to the emergency room for acute abortive treatment.  No red flag symptoms today.  Discussed red flag symptoms.  Maxalt for future use if needed.  Will need follow-up with PCP to discuss headache management.  Patient will make an appointment.  Discussed in detail that she should not be breast-feeding today after the medications that were given to her.  Follow up: Return in about 2 weeks (around 01/07/2018) for recheck migraines.   No orders of the defined types were placed in this encounter.  Meds ordered this encounter  Medications  . ketorolac (TORADOL) injection 60 mg  . promethazine (PHENERGAN) injection 25 mg  .  rizatriptan (MAXALT-MLT) 10 MG disintegrating tablet    Sig: Take 1 tablet (10 mg total) by mouth as needed for migraine. May repeat in 2 hours if needed    Dispense:  10 tablet    Refill:  6  . promethazine (PHENERGAN) 25 MG tablet    Sig: Take 1 tablet (25 mg total) by mouth every 6 (six) hours as needed for nausea or vomiting.    Dispense:  20 tablet    Refill:  0  . traMADol (ULTRAM) 50 MG tablet    Sig: Take 1 tablet (50 mg total) by mouth every 6 (six) hours as needed for moderate pain.    Dispense:  30 tablet    Refill:  0      I reviewed the patients updated PMH, FH, and SocHx.    Patient Active Problem List   Diagnosis Date Noted  . Encounter for induction of labor 02/27/2017  . Physical exam 12/07/2016  . Sprain of ankle 05/23/2015  . Rib pain on left side 05/23/2015  . Indication for care in labor or delivery 01/03/2015  . Spontaneous vaginal delivery 01/03/2015  . Right facial numbness 03/17/2014  . Blurred vision, bilateral 03/17/2014  . Amenorrhea 01/05/2014  . GERD (gastroesophageal reflux disease) 01/04/2014  . Back pain 10/30/2013  . Allergic rhinitis 09/07/2013  . RAD (reactive airway disease) 09/07/2013  . Atypical nevus 09/07/2013  . Sinusitis, acute maxillary 08/07/2012  . Epistaxis 05/15/2012  . Migraine 05/15/2012  .  Soft tissue mass 05/15/2012  . Trapezius muscle spasm 05/15/2012  . MOLLUSCUM CONTAGIOSUM 10/23/2006   Current Meds  Medication Sig  . cetirizine (ZYRTEC) 10 MG tablet Take 10 mg by mouth daily.  . Prenatal Vit-Fe Fumarate-FA (PRENATAL MULTIVITAMIN) TABS tablet Take 1 tablet by mouth daily at 12 noon.   Past Medical History:  Diagnosis Date  . Asthma   . Migraines     Allergies: Patient is allergic to other. Family History: Patient family history includes Alzheimer's disease in her maternal grandfather; Anemia in her sister; Diabetes in her father, paternal grandfather, and paternal grandmother; Hypertension in her father,  paternal grandfather, and paternal grandmother; Stroke in her maternal grandfather; Thyroid disease in her sister. Social History:  Patient  reports that she has never smoked. She has never used smokeless tobacco. She reports that she drinks alcohol. She reports that she does not use drugs.  Review of Systems: Constitutional: Negative for fever malaise or anorexia Cardiovascular: negative for chest pain Respiratory: negative for SOB or persistent cough Gastrointestinal: negative for abdominal pain  Objective  Vitals: BP 122/86   Pulse (!) 113   Temp 98.3 F (36.8 C)   Ht 5\' 4"  (1.626 m)   Wt 133 lb (60.3 kg)   SpO2 99%   BMI 22.83 kg/m  General: Patient is lying in fetal position on exam table in darkened room, she is alert and oriented x3.  Her speech is normal, her cognition is normal. HEENT: PEERL, positive photophobia, conjunctiva normal, Oropharynx moist,neck is supple Cardiovascular:  RRR without murmur or gallop.  Respiratory:  Good breath sounds bilaterally, CTAB with normal respiratory effort Skin:  Warm, no rashes Moves 4 extremities equally.  Toradol 60 mg IM and Phenergan 25 mg IM given in office.  Minimal relief.  Patient left office stable.     Commons side effects, risks, benefits, and alternatives for medications and treatment plan prescribed today were discussed, and the patient expressed understanding of the given instructions. Patient is instructed to call or message via MyChart if he/she has any questions or concerns regarding our treatment plan. No barriers to understanding were identified. We discussed Red Flag symptoms and signs in detail. Patient expressed understanding regarding what to do in case of urgent or emergency type symptoms.   Medication list was reconciled, printed and provided to the patient in AVS. Patient instructions and summary information was reviewed with the patient as documented in the AVS. This note was prepared with assistance of  Dragon voice recognition software. Occasional wrong-word or sound-a-like substitutions may have occurred due to the inherent limitations of voice recognition software

## 2017-12-25 ENCOUNTER — Other Ambulatory Visit: Payer: Self-pay | Admitting: *Deleted

## 2017-12-25 DIAGNOSIS — G43111 Migraine with aura, intractable, with status migrainosus: Secondary | ICD-10-CM

## 2018-02-26 ENCOUNTER — Ambulatory Visit (INDEPENDENT_AMBULATORY_CARE_PROVIDER_SITE_OTHER): Payer: Commercial Managed Care - PPO | Admitting: Pulmonary Disease

## 2018-02-26 ENCOUNTER — Ambulatory Visit (INDEPENDENT_AMBULATORY_CARE_PROVIDER_SITE_OTHER): Payer: Commercial Managed Care - PPO | Admitting: Neurology

## 2018-02-26 ENCOUNTER — Encounter: Payer: Self-pay | Admitting: Pulmonary Disease

## 2018-02-26 ENCOUNTER — Encounter: Payer: Self-pay | Admitting: Neurology

## 2018-02-26 VITALS — BP 118/78 | HR 99 | Ht 64.0 in | Wt 135.8 lb

## 2018-02-26 VITALS — BP 110/80 | HR 108 | Ht 64.0 in | Wt 134.0 lb

## 2018-02-26 DIAGNOSIS — J209 Acute bronchitis, unspecified: Secondary | ICD-10-CM

## 2018-02-26 DIAGNOSIS — G43709 Chronic migraine without aura, not intractable, without status migrainosus: Secondary | ICD-10-CM | POA: Diagnosis not present

## 2018-02-26 DIAGNOSIS — R059 Cough, unspecified: Secondary | ICD-10-CM | POA: Insufficient documentation

## 2018-02-26 DIAGNOSIS — H6502 Acute serous otitis media, left ear: Secondary | ICD-10-CM

## 2018-02-26 DIAGNOSIS — R51 Headache with orthostatic component, not elsewhere classified: Secondary | ICD-10-CM

## 2018-02-26 DIAGNOSIS — J0110 Acute frontal sinusitis, unspecified: Secondary | ICD-10-CM

## 2018-02-26 DIAGNOSIS — R05 Cough: Secondary | ICD-10-CM

## 2018-02-26 DIAGNOSIS — H539 Unspecified visual disturbance: Secondary | ICD-10-CM | POA: Diagnosis not present

## 2018-02-26 DIAGNOSIS — J069 Acute upper respiratory infection, unspecified: Secondary | ICD-10-CM

## 2018-02-26 DIAGNOSIS — R519 Headache, unspecified: Secondary | ICD-10-CM

## 2018-02-26 DIAGNOSIS — G441 Vascular headache, not elsewhere classified: Secondary | ICD-10-CM

## 2018-02-26 DIAGNOSIS — H15102 Unspecified episcleritis, left eye: Secondary | ICD-10-CM | POA: Insufficient documentation

## 2018-02-26 MED ORDER — FLUCONAZOLE 150 MG PO TABS: 150.0000 mg | ORAL_TABLET | Freq: Every day | ORAL | 0 refills | Status: DC

## 2018-02-26 MED ORDER — METHYLPREDNISOLONE 4 MG PO TBPK: ORAL_TABLET | ORAL | 0 refills | Status: DC

## 2018-02-26 MED ORDER — HYDROCOD POLST-CPM POLST ER 10-8 MG/5ML PO SUER: 5.0000 mL | Freq: Two times a day (BID) | ORAL | 0 refills | Status: DC | PRN

## 2018-02-26 MED ORDER — AMOXICILLIN-POT CLAVULANATE 875-125 MG PO TABS: 1.0000 | ORAL_TABLET | Freq: Two times a day (BID) | ORAL | 0 refills | Status: AC

## 2018-02-26 NOTE — Progress Notes (Signed)
[id number]    Provider:  Dr Jaynee Eagles Referring Provider: Billey Chang, MD Primary Care Physician:  Billey Chang, MD  CC:  migraines  HPI:  Caitlin Duarte is a 32 y.o. female here as requested by Billey Chang, MDfor migraines. Migraines worsening. She has a 9 month old. Migraine started in high school. She didn't have many migraines after her first child she breast fed for a year and became pregnant. She has an IUD. She started having heavy periods in August and those are when she started having worsening migraines. She had the worst headache recently. She had MRIs in the past which were normal. She started a new job. She stopped breastfeeding. She has 3 migraines a month and can each last 3 days. They can be anytime, not related to her monthly menses. She takes Maxalt. Excedrin in the past. Migraines throbbing, pain, left sided usually, +light/sound sensitivity, nausea, vomiting. Lasted 3 days and did not respond to acute treatment. She can have "floaters" before her migraines but often no aura, she can associated ear pain and hearing changes.   Reviewed notes, labs and imaging from outside physicians, which showed   CT head 03/2014 showed No acute intracranial abnormalities including mass lesion or mass effect, hydrocephalus, extra-axial fluid collection, midline shift, hemorrhage, or acute infarction, large ischemic events (personally reviewed images)  Reviewed Dr. Johney Frame notes.  Patient has 2 children youngest child is 31 months and she is currently breast-feeding.  Patient presents due to her intractable migraine.  Started over 24 hours prior to appointment.  Associated photophobia, phonophobia, nausea with vomiting, confusion with an aura with left-sided visual changes that are typical for her headaches.  Prior to that her last migraine was about a month ago.  Prior to that they have been rare.  She is used Maxalt in the past.  She is use Excedrin Migraine.  She was treated  with Toradol and Phenergan in the office without significant relief.  She was sent home with Zofran and tramadol.   Review of Systems: Patient complains of symptoms per HPI as well as the following symptoms: headache. Pertinent negatives and positives per HPI. All others negative.   Social History   Socioeconomic History  . Marital status: Married    Spouse name: Not on file  . Number of children: 2  . Years of education: Not on file  . Highest education level: Bachelor's degree (e.g., BA, AB, BS)  Occupational History  . Not on file  Social Needs  . Financial resource strain: Not on file  . Food insecurity:    Worry: Not on file    Inability: Not on file  . Transportation needs:    Medical: Not on file    Non-medical: Not on file  Tobacco Use  . Smoking status: Never Smoker  . Smokeless tobacco: Never Used  Substance and Sexual Activity  . Alcohol use: Never    Frequency: Never    Comment: socially  . Drug use: No  . Sexual activity: Yes    Birth control/protection: I.U.D.  Lifestyle  . Physical activity:    Days per week: Not on file    Minutes per session: Not on file  . Stress: Not on file  Relationships  . Social connections:    Talks on phone: Not on file    Gets together: Not on file    Attends religious service: Not on file    Active member of club or organization: Not on file  Attends meetings of clubs or organizations: Not on file    Relationship status: Not on file  . Intimate partner violence:    Fear of current or ex partner: Not on file    Emotionally abused: Not on file    Physically abused: Not on file    Forced sexual activity: Not on file  Other Topics Concern  . Not on file  Social History Narrative   Lives at home with her husband and 2 sons   Right handed    Caffeine: 1 cup daily    Family History  Problem Relation Age of Onset  . Anemia Sister   . Thyroid disease Sister   . Headache Sister        ?migraines  . Diabetes  Paternal Grandmother   . Hypertension Paternal Grandmother   . Heart disease Paternal Grandmother   . Heart attack Paternal Grandmother   . Diabetes Paternal Grandfather   . Hypertension Paternal Grandfather   . Hypertension Father   . Diabetes Father   . Alzheimer's disease Maternal Grandfather   . Stroke Maternal Grandfather     Past Medical History:  Diagnosis Date  . Asthma    ?allergy induced asthma  . Migraines     Past Surgical History:  Procedure Laterality Date  . APPENDECTOMY  2008  . EYE SURGERY  1997  . NOSE SURGERY  2006  . SINUS SURGERY WITH INSTATRAK    . WISDOM TOOTH EXTRACTION  2006    Current Outpatient Medications  Medication Sig Dispense Refill  . cetirizine (ZYRTEC) 10 MG tablet Take 10 mg by mouth daily.    Marland Kitchen levonorgestrel (MIRENA, 52 MG,) 20 MCG/24HR IUD Mirena 20 mcg/24 hours (5 yrs) 52 mg intrauterine device  Take 1 device by intrauterine route.    Marland Kitchen OVER THE COUNTER MEDICATION sinus d with pseudoephedrine    . promethazine (PHENERGAN) 25 MG tablet Take 1 tablet (25 mg total) by mouth every 6 (six) hours as needed for nausea or vomiting. 20 tablet 0  . rizatriptan (MAXALT-MLT) 10 MG disintegrating tablet Take 1 tablet (10 mg total) by mouth as needed for migraine. May repeat in 2 hours if needed 10 tablet 6  . amoxicillin-clavulanate (AUGMENTIN) 875-125 MG tablet Take 1 tablet by mouth 2 (two) times daily for 7 days. 14 tablet 0  . chlorpheniramine-HYDROcodone (TUSSIONEX PENNKINETIC ER) 10-8 MG/5ML SUER Take 5 mLs by mouth every 12 (twelve) hours as needed for cough. 140 mL 0  . fluconazole (DIFLUCAN) 150 MG tablet Take 1 tablet (150 mg total) by mouth daily. 2 tablet 0  . methylPREDNISolone (MEDROL DOSEPAK) 4 MG TBPK tablet follow package directions 21 tablet 0  . traMADol (ULTRAM) 50 MG tablet Take 1 tablet (50 mg total) by mouth every 6 (six) hours as needed for moderate pain. 30 tablet 0   No current facility-administered medications for this  visit.     Allergies as of 02/26/2018 - Review Complete 02/26/2018  Allergen Reaction Noted  . Other Anaphylaxis and Shortness Of Breath 09/15/2012    Vitals: BP 110/80 (BP Location: Right Arm, Patient Position: Sitting)   Pulse (!) 108   Ht 5\' 4"  (1.626 m)   Wt 134 lb (60.8 kg)   Breastfeeding No   BMI 23.00 kg/m  Last Weight:  Wt Readings from Last 1 Encounters:  02/26/18 135 lb 12.8 oz (61.6 kg)   Last Height:   Ht Readings from Last 1 Encounters:  02/26/18 5\' 4"  (1.626 m)  Physical exam: Exam: Gen: NAD, conversant, well nourised, well groomed                     CV: RRR, no MRG. No Carotid Bruits. No peripheral edema, warm, nontender Eyes: Conjunctivae clear without exudates or hemorrhage  Neuro: Detailed Neurologic Exam  Speech:    Speech is normal; fluent and spontaneous with normal comprehension.  Cognition:    The patient is oriented to person, place, and time;     recent and remote memory intact;     language fluent;     normal attention, concentration,     fund of knowledge Cranial Nerves:    The pupils are equal, round, and reactive to light. The fundi are normal and spontaneous venous pulsations are present. Visual fields are full to finger confrontation. Extraocular movements are intact. Trigeminal sensation is intact and the muscles of mastication are normal. The face is symmetric. The palate elevates in the midline. Hearing intact. Voice is normal. Shoulder shrug is normal. The tongue has normal motion without fasciculations.   Coordination:    Normal finger to nose and heel to shin. Normal rapid alternating movements.   Gait:    Heel-toe and tandem gait are normal.   Motor Observation:    No asymmetry, no atrophy, and no involuntary movements noted. Tone:    Normal muscle tone.    Posture:    Posture is normal. normal erect    Strength:    Strength is V/V in the upper and lower limbs.      Sensation: intact to LT     Reflex  Exam:  DTR's:    Deep tendon reflexes in the upper and lower extremities are normal bilaterally.   Toes:    The toes are downgoing bilaterally.   Clonus:    Clonus is absent.      Assessment/Plan:  32 year old with likely migraines but needs thorough evaluation  OBGYn and discuss NuvaRing She just stopped breastfeeding. MRI brain w/wo contrast: MRI brain due to concerning symptoms of morning headaches, positional headaches,vision changes  to look for space occupying mass, chiari or intracranial hypertension (pseudotumor). Labs  Orders Placed This Encounter  Procedures  . MR BRAIN W WO CONTRAST  . CBC  . Comprehensive metabolic panel  . Thyroid Panel With TSH   Cc: Billey Chang, MD  Sarina Ill, MD  [facility] [address] [place], [place] [address]  Phone [phone] Fax [phone]

## 2018-02-26 NOTE — Patient Instructions (Addendum)
Thank you for visiting Dr. Valeta Harms at [facility]. Today we recommend the following: No orders of the defined types were placed in this encounter.  Meds ordered this encounter  Medications  . methylPREDNISolone (MEDROL DOSEPAK) 4 MG TBPK tablet    Sig: follow package directions    Dispense:  21 tablet    Refill:  0    Please provide 6 day pack.  Thanks.  Marland Kitchen amoxicillin-clavulanate (AUGMENTIN) 875-125 MG tablet    Sig: Take 1 tablet by mouth 2 (two) times daily for 7 days.    Dispense:  14 tablet    Refill:  0  . chlorpheniramine-HYDROcodone (TUSSIONEX PENNKINETIC ER) 10-8 MG/5ML SUER    Sig: Take 5 mLs by mouth every 12 (twelve) hours as needed for cough.    Dispense:  140 mL    Refill:  0   Return if symptoms worsen or fail to improve.

## 2018-02-26 NOTE — Progress Notes (Signed)
Synopsis: Self-referral for cough, shortness of breath, ear pain, sinus congestion.  Subjective:   PATIENT ID: Caitlin Duarte GENDER: female DOB: [birth year], MRN: [id number]  Chief Complaint  Patient presents with  . Consult    States Caitlin Duarte has had a cough for 3 weeks. States Caitlin Duarte is congested and is coughing up dark yellow mucous. Sinus headaches and sinus pressure. Sore throat.    PMH of possible asthma? Not really sure but as a young adult had an episode of wheezing. History of migraines.  Patient's family had upper respiratory tract infection symptoms starting 4 weeks ago.  Approximately 3-1/2 weeks ago Caitlin Duarte developed fevers that have dissipated that were associated with headache, sinus congestion sore throat.  Caitlin Duarte currently has 2 small children at home.  Her husband was also sick during this time.  All of them have gotten better.  However her symptoms got better within the first week and then have progressively gotten worse over the past 10 days.  At this point Caitlin Duarte has had ongoing cough.  For the past 3 days Caitlin Duarte has had worsening left ear pain.  Ear pain has radiated into her upper teeth.  Caitlin Duarte also has significant drainage from both nares.  Her sore throat has resolved but remains hoarse due to ongoing productive cough.  Her cough is not allowing her to sleep at night.  Caitlin Duarte stays awake most nights.  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  Caitlin Duarte noticed this morning that her left eye was red and this has progressively gotten worse throughout the day.  Caitlin Duarte does wear contacts.  Caitlin Duarte denies rash.  Caitlin Duarte does have headache but has had frequent issues with migraine.  Caitlin Duarte denies neck stiffness.   Past Medical History:  Diagnosis Date  . Asthma    ?allergy induced asthma  . Migraines      Family History  Problem Relation Age of Onset  . Anemia Sister   . Thyroid disease Sister   . Headache Sister        ?migraines  . Diabetes Paternal Grandmother   . Hypertension  Paternal Grandmother   . Heart disease Paternal Grandmother   . Heart attack Paternal Grandmother   . Diabetes Paternal Grandfather   . Hypertension Paternal Grandfather   . Hypertension Father   . Diabetes Father   . Alzheimer's disease Maternal Grandfather   . Stroke Maternal Grandfather      Past Surgical History:  Procedure Laterality Date  . APPENDECTOMY  2008  . EYE SURGERY  1997  . NOSE SURGERY  2006  . SINUS SURGERY WITH INSTATRAK    . WISDOM TOOTH EXTRACTION  2006    Social History   Socioeconomic History  . Marital status: Married    Spouse name: Not on file  . Number of children: 2  . Years of education: Not on file  . Highest education level: Bachelor's degree (e.g., BA, AB, BS)  Occupational History  . Not on file  Social Needs  . Financial resource strain: Not on file  . Food insecurity:    Worry: Not on file    Inability: Not on file  . Transportation needs:    Medical: Not on file    Non-medical: Not on file  Tobacco Use  . Smoking status: Never Smoker  . Smokeless tobacco: Never Used  Substance and Sexual Activity  . Alcohol use: Never    Frequency: Never    Comment: socially  .  Drug use: No  . Sexual activity: Yes    Birth control/protection: I.U.D.  Lifestyle  . Physical activity:    Days per week: Not on file    Minutes per session: Not on file  . Stress: Not on file  Relationships  . Social connections:    Talks on phone: Not on file    Gets together: Not on file    Attends religious service: Not on file    Active member of club or organization: Not on file    Attends meetings of clubs or organizations: Not on file    Relationship status: Not on file  . Intimate partner violence:    Fear of current or ex partner: Not on file    Emotionally abused: Not on file    Physically abused: Not on file    Forced sexual activity: Not on file  Other Topics Concern  . Not on file  Social History Narrative   Lives at home with her husband  and 2 sons   Right handed    Caffeine: 1 cup daily     Allergies  Allergen Reactions  . Other Anaphylaxis and Shortness Of Breath    curry     Outpatient Medications Prior to Visit  Medication Sig Dispense Refill  . cetirizine (ZYRTEC) 10 MG tablet Take 10 mg by mouth daily.    Marland Kitchen levonorgestrel (MIRENA, 52 MG,) 20 MCG/24HR IUD Mirena 20 mcg/24 hours (5 yrs) 52 mg intrauterine device  Take 1 device by intrauterine route.    Marland Kitchen OVER THE COUNTER MEDICATION sinus d with pseudoephedrine    . promethazine (PHENERGAN) 25 MG tablet Take 1 tablet (25 mg total) by mouth every 6 (six) hours as needed for nausea or vomiting. 20 tablet 0  . rizatriptan (MAXALT-MLT) 10 MG disintegrating tablet Take 1 tablet (10 mg total) by mouth as needed for migraine. May repeat in 2 hours if needed 10 tablet 6  . traMADol (ULTRAM) 50 MG tablet Take 1 tablet (50 mg total) by mouth every 6 (six) hours as needed for moderate pain. 30 tablet 0   No facility-administered medications prior to visit.     Review of Systems  Constitutional: Positive for malaise/fatigue. Negative for chills, fever and weight loss.  HENT: Positive for ear discharge, ear pain, sinus pain and sore throat. Negative for hearing loss and tinnitus.   Eyes: Positive for redness. Negative for blurred vision and double vision.  Respiratory: Positive for cough, sputum production and shortness of breath. Negative for hemoptysis, wheezing and stridor.   Cardiovascular: Negative for chest pain, palpitations, orthopnea, leg swelling and PND.  Gastrointestinal: Negative for abdominal pain, constipation, diarrhea, heartburn, nausea and vomiting.  Genitourinary: Negative for dysuria, hematuria and urgency.  Musculoskeletal: Positive for myalgias. Negative for joint pain.  Skin: Negative for itching and rash.  Neurological: Negative for dizziness, tingling, weakness and headaches.  Endo/Heme/Allergies: Negative for environmental allergies. Does not  bruise/bleed easily.  Psychiatric/Behavioral: Negative for depression. The patient is not nervous/anxious and does not have insomnia.   All other systems reviewed and are negative.    Objective:  Physical Exam Vitals signs reviewed.  Constitutional:      General: Caitlin Duarte is not in acute distress.    Appearance: Caitlin Duarte is well-developed.  HENT:     Head: Normocephalic and atraumatic.     Right Ear: Tympanic membrane is injected.     Left Ear: Tympanic membrane is injected and bulging.     Nose: Congestion and rhinorrhea  present.     Mouth/Throat:     Mouth: Mucous membranes are moist.     Pharynx: Posterior oropharyngeal erythema present.  Eyes:     General: No scleral icterus.    Conjunctiva/sclera:     Right eye: Right conjunctiva is injected.     Left eye: Left conjunctiva is not injected.     Pupils: Pupils are equal, round, and reactive to light.  Neck:     Musculoskeletal: Neck supple.     Vascular: No JVD.     Trachea: No tracheal deviation.  Cardiovascular:     Rate and Rhythm: Regular rhythm. Tachycardia present.     Heart sounds: Normal heart sounds. No murmur.  Pulmonary:     Effort: Pulmonary effort is normal. No tachypnea, accessory muscle usage or respiratory distress.     Breath sounds: No stridor. No wheezing, rhonchi or rales.     Comments: Bilateral bronchial breath sounds Abdominal:     General: Bowel sounds are normal. There is no distension.     Palpations: Abdomen is soft.     Tenderness: There is no abdominal tenderness.  Musculoskeletal:        General: No tenderness.  Lymphadenopathy:     Cervical: No cervical adenopathy.  Skin:    General: Skin is warm and dry.     Capillary Refill: Capillary refill takes less than 2 seconds.     Findings: No rash.  Neurological:     Mental Status: Caitlin Duarte is alert and oriented to person, place, and time.  Psychiatric:        Behavior: Behavior normal.      Vitals:   02/26/18 1618  BP: 118/78  Pulse: 99    SpO2: 97%  Weight: 135 lb 12.8 oz (61.6 kg)  Height: 5\' 4"  (1.626 m)   97% on RA BMI Readings from Last 3 Encounters:  02/26/18 23.31 kg/m  02/26/18 23.00 kg/m  12/24/17 22.83 kg/m   Wt Readings from Last 3 Encounters:  02/26/18 135 lb 12.8 oz (61.6 kg)  02/26/18 134 lb (60.8 kg)  12/24/17 133 lb (60.3 kg)     CBC    Component Value Date/Time   WBC 12.5 (H) 02/28/2017 0517   RBC 3.92 02/28/2017 0517   HGB 12.0 02/28/2017 0517   HGB 14.2 10/27/2013 1009   HCT 35.6 (L) 02/28/2017 0517   PLT 212 02/28/2017 0517   MCV 90.8 02/28/2017 0517   MCH 30.6 02/28/2017 0517   MCHC 33.7 02/28/2017 0517   RDW 13.5 02/28/2017 0517   LYMPHSABS 3,172 06/20/2016 1557   MONOABS 854 06/20/2016 1557   EOSABS 122 06/20/2016 1557   BASOSABS 122 06/20/2016 1557    Chest Imaging: None   Pulmonary Functions Testing Results: None   FeNO: None     Assessment & Plan:   Upper respiratory tract infection, unspecified type  Episcleritis of left eye  Acute frontal sinusitis, recurrence not specified  Acute serous otitis media of left ear, recurrence not specified  Cough  Acute bronchitis, unspecified organism  Discussion:  This is a 32 year old female with a history of recent upper respiratory tract infection which was likely viral approximately 3 and half weeks ago.  Caitlin Duarte got better for short period of time, fever subsided now over the past 10 days has had progressive cough.  Now with shortness of breath and sputum production.  I suspect Caitlin Duarte now has acute bronchitis as well as left ear pain from ongoing otitis media and left frontal sinus  pain pressure.  Caitlin Duarte also has significant nasal drainage.  We will recommend the following: We will give her a Medrol Dosepak Start Augmentin 875/125 for 7 days We will give her Tussionex cough syrup. We will also send prescription for Diflucan if Caitlin Duarte develops vaginal yeast infection symptoms while on antibiotics. Recommended over-the-counter eye  moisture drops/antihistamine drops for the left episcleritis.  Also recommended to not wear her contacts until the left eye inflammation has decreased.   Current Outpatient Medications:  .  cetirizine (ZYRTEC) 10 MG tablet, Take 10 mg by mouth daily., Disp: , Rfl:  .  levonorgestrel (MIRENA, 52 MG,) 20 MCG/24HR IUD, Mirena 20 mcg/24 hours (5 yrs) 52 mg intrauterine device  Take 1 device by intrauterine route., Disp: , Rfl:  .  OVER THE COUNTER MEDICATION, sinus d with pseudoephedrine, Disp: , Rfl:  .  promethazine (PHENERGAN) 25 MG tablet, Take 1 tablet (25 mg total) by mouth every 6 (six) hours as needed for nausea or vomiting., Disp: 20 tablet, Rfl: 0 .  rizatriptan (MAXALT-MLT) 10 MG disintegrating tablet, Take 1 tablet (10 mg total) by mouth as needed for migraine. May repeat in 2 hours if needed, Disp: 10 tablet, Rfl: 6 .  traMADol (ULTRAM) 50 MG tablet, Take 1 tablet (50 mg total) by mouth every 6 (six) hours as needed for moderate pain., Disp: 30 tablet, Rfl: 0   Garner Nash, DO [facility] 02/26/2018 4:26 PM

## 2018-02-27 LAB — THYROID PANEL WITH TSH
Free Thyroxine Index: 3.7 (ref 1.2–4.9)
T3 Uptake Ratio: 34 % (ref 24–39)
T4, Total: 11 ug/dL (ref 4.5–12.0)
TSH: 1.08 u[IU]/mL (ref 0.450–4.500)

## 2018-02-27 LAB — COMPREHENSIVE METABOLIC PANEL
ALT: 28 IU/L (ref 0–32)
AST: 17 IU/L (ref 0–40)
Albumin/Globulin Ratio: 2.7 — ABNORMAL HIGH (ref 1.2–2.2)
Albumin: 4.8 g/dL (ref 3.5–5.5)
Alkaline Phosphatase: 106 IU/L (ref 39–117)
BUN/Creatinine Ratio: 18 (ref 9–23)
BUN: 12 mg/dL (ref 6–20)
Bilirubin Total: 0.3 mg/dL (ref 0.0–1.2)
CALCIUM: 9.8 mg/dL (ref 8.7–10.2)
CO2: 20 mmol/L (ref 20–29)
Chloride: 105 mmol/L (ref 96–106)
Creatinine, Ser: 0.68 mg/dL (ref 0.57–1.00)
GFR calc non Af Amer: 118 mL/min/{1.73_m2} (ref >59)
GFR, EST AFRICAN AMERICAN: 136 mL/min/{1.73_m2} (ref >59)
Globulin, Total: 1.8 g/dL (ref 1.5–4.5)
Glucose: 93 mg/dL (ref 65–99)
Potassium: 4.3 mmol/L (ref 3.5–5.2)
Sodium: 145 mmol/L — ABNORMAL HIGH (ref 134–144)
TOTAL PROTEIN: 6.6 g/dL (ref 6.0–8.5)

## 2018-02-27 LAB — CBC
Hematocrit: 37.2 % (ref 34.0–46.6)
Hemoglobin: 13.4 g/dL (ref 11.1–15.9)
MCH: 30.1 pg (ref 26.6–33.0)
MCHC: 36 g/dL — ABNORMAL HIGH (ref 31.5–35.7)
MCV: 84 fL (ref 79–97)
Platelets: 509 10*3/uL — ABNORMAL HIGH (ref 150–450)
RBC: 4.45 x10E6/uL (ref 3.77–5.28)
RDW: 12.3 % (ref 12.3–15.4)
WBC: 12.4 10*3/uL — ABNORMAL HIGH (ref 3.4–10.8)

## 2018-03-05 ENCOUNTER — Encounter: Payer: Self-pay | Admitting: Neurology

## 2018-03-05 DIAGNOSIS — G43709 Chronic migraine without aura, not intractable, without status migrainosus: Secondary | ICD-10-CM | POA: Insufficient documentation

## 2018-03-12 HISTORY — PX: TONSILLECTOMY: SUR1361

## 2018-03-13 ENCOUNTER — Telehealth: Payer: Self-pay | Admitting: Neurology

## 2018-03-13 NOTE — Telephone Encounter (Signed)
I called the patient to schedule her MRI but the patient did not answer so I left a VM asking her to call back.

## 2018-03-13 NOTE — Telephone Encounter (Signed)
Patient called back and I went over the cost of the MRI with her. She is going to discuss with her husband and call back.

## 2018-04-03 ENCOUNTER — Telehealth: Payer: Self-pay | Admitting: Pulmonary Disease

## 2018-04-03 DIAGNOSIS — J069 Acute upper respiratory infection, unspecified: Secondary | ICD-10-CM

## 2018-04-03 MED ORDER — MOXIFLOXACIN HCL 400 MG PO TABS
400.0000 mg | ORAL_TABLET | Freq: Every day | ORAL | 0 refills | Status: DC
Start: 2018-04-03 — End: 2018-11-11

## 2018-04-03 MED ORDER — BENZONATATE 100 MG PO CAPS: 100.0000 mg | ORAL_CAPSULE | Freq: Three times a day (TID) | ORAL | 1 refills | Status: DC

## 2018-04-03 NOTE — Telephone Encounter (Signed)
Patient is a Patient of Dr. Valeta Harms. She is the spouse of Wyn Quaker, NP.  She is having a prolonged cough for 2 weeks, discolored mucus.  It is effecting her sleep at night.  She is requesting antibiotic.  Will route message to Di Kindle, NP

## 2018-04-03 NOTE — Telephone Encounter (Signed)
Please call to let her know that I called in Avelox and some tessalon perles for cough.

## 2018-04-03 NOTE — Telephone Encounter (Signed)
Patient is aware BM spoke with her nothing further needed.

## 2018-04-08 MED ORDER — MOXIFLOXACIN HCL 400 MG PO TABS: 400.0000 mg | ORAL_TABLET | Freq: Every day | ORAL | 0 refills | Status: AC

## 2018-04-08 NOTE — Addendum Note (Signed)
Addended by: Fenton Foy on: 04/08/2018 11:47 AM   Modules accepted: Orders

## 2018-05-20 ENCOUNTER — Telehealth: Payer: Commercial Managed Care - PPO | Admitting: Nurse Practitioner

## 2018-05-20 DIAGNOSIS — R6889 Other general symptoms and signs: Secondary | ICD-10-CM

## 2018-05-20 DIAGNOSIS — R05 Cough: Secondary | ICD-10-CM | POA: Diagnosis not present

## 2018-05-20 DIAGNOSIS — M791 Myalgia, unspecified site: Secondary | ICD-10-CM | POA: Diagnosis not present

## 2018-05-20 DIAGNOSIS — R509 Fever, unspecified: Secondary | ICD-10-CM

## 2018-05-20 DIAGNOSIS — R51 Headache: Secondary | ICD-10-CM

## 2018-05-20 MED ORDER — OSELTAMIVIR PHOSPHATE 75 MG PO CAPS
75.0000 mg | ORAL_CAPSULE | Freq: Two times a day (BID) | ORAL | 0 refills | Status: DC
Start: 2018-05-20 — End: 2018-11-11

## 2018-05-20 NOTE — Progress Notes (Signed)

## 2018-08-03 ENCOUNTER — Telehealth: Payer: Commercial Managed Care - PPO | Admitting: Family

## 2018-08-03 DIAGNOSIS — B9689 Other specified bacterial agents as the cause of diseases classified elsewhere: Secondary | ICD-10-CM

## 2018-08-03 DIAGNOSIS — J028 Acute pharyngitis due to other specified organisms: Secondary | ICD-10-CM

## 2018-08-03 MED ORDER — AMOXICILLIN-POT CLAVULANATE 875-125 MG PO TABS: 1.0000 | ORAL_TABLET | Freq: Two times a day (BID) | ORAL | 0 refills | Status: DC

## 2018-08-03 NOTE — Progress Notes (Signed)
We are sorry that you are not feeling well.  Here is how we plan to help!  Based on what you have shared with me it is likely that you have strep pharyngitis.  Strep pharyngitis is inflammation and infection in the back of the throat.  This is an infection cause by bacteria and is treated with antibiotics.  I have prescribed Augmentin 875 mg/ 125 mg one tablet twice daily for 7 days. For throat pain, we recommend over the counter oral pain relief medications such as acetaminophen or aspirin, or anti-inflammatory medications such as ibuprofen or naproxen sodium. Topical treatments such as oral throat lozenges or sprays may be used as needed. Strep infections are not as easily transmitted as other respiratory infections, however we still recommend that you avoid close contact with loved ones, especially the very young and elderly.  Remember to wash your hands thoroughly throughout the day as this is the number one way to prevent the spread of infection and wipe down door knobs and counters with disinfectant.   Home Care:  Only take medications as instructed by your medical team.  Complete the entire course of an antibiotic.  Do not take these medications with alcohol.  A steam or ultrasonic humidifier can help congestion.  You can place a towel over your head and breathe in the steam from hot water coming from a faucet.  Avoid close contacts especially the very young and the elderly.  Cover your mouth when you cough or sneeze.  Always remember to wash your hands.  Get Help Right Away If:  You develop worsening fever or sinus pain.  You develop a severe head ache or visual changes.  Your symptoms persist after you have completed your treatment plan.  Make sure you  Understand these instructions.  Will watch your condition.  Will get help right away if you are not doing well or get worse.  Your e-visit answers were reviewed by a board certified advanced clinical practitioner to  complete your personal care plan.  Depending on the condition, your plan could have included both over the counter or prescription medications.  If there is a problem please reply  once you have received a response from your provider.  Your safety is important to [place].  If you have drug allergies check your prescription carefully.    You can use MyChart to ask questions about today's visit, request a non-urgent call back, or ask for a work or school excuse for 24 hours related to this e-Visit. If it has been greater than 24 hours you will need to follow up with your provider, or enter a new e-Visit to address those concerns.  You will get an e-mail in the next two days asking about your experience.  I hope that your e-visit has been valuable and will speed your recovery. Thank you for using e-visits.  Greater than 5 minutes, yet less than 10 minutes of time have been spent researching, coordinating, and implementing care for this patient today.  Thank you for the details you included in the comment boxes. Those details are very helpful in determining the best course of treatment for you and help [place] to provide the best care.

## 2018-08-03 NOTE — Addendum Note (Signed)
Addended by: Dutch Quint B on: 08/03/2018 05:28 PM   Modules accepted: Orders

## 2018-09-09 LAB — HM PAP SMEAR

## 2018-09-17 ENCOUNTER — Encounter: Payer: Self-pay | Admitting: General Practice

## 2018-10-02 MED FILL — HYDROCOD-APAP 7.5-325/15ML: 7.5-325 | 5 days supply | Qty: 418 | Fill #0

## 2018-10-09 NOTE — Progress Notes (Addendum)
[facility] Note  10/10/2018     CHIEF COMPLAINT Patient presents for Retina Evaluation   HISTORY OF PRESENT ILLNESS: Caitlin Duarte is a 33 y.o. female who presents to the clinic today for:   HPI    Retina Evaluation    In both eyes.  This started days ago.  Duration of days.  Context:  distance vision.  I, the attending physician,  performed the HPI with the patient and updated documentation appropriately.          Comments    New patient retina evaluation Patient states she has been wearing glasses since she was 4.  Patient states she has always been very nearsighted and states her grandmother and mother is very nearsighted as well.  Grandmother has Hx of RD and cornea transplant.  Patient has a Hx of muscle sx OS at age 71 (Dr. Katy Fitch).  Patient has not noticed any decreases in her vision and has not noticed any visual disturbances.         Last edited by Bernarda Caffey, MD on 10/10/2018  8:40 AM. (History)    Patient states she has a family history of nearsightedness and hasn't noticed any decreases in her vision or any visual disturbances.  Referring physician: [name], PA-C [address] [place],  [place] [address]  HISTORICAL INFORMATION:   Selected notes from the MEDICAL RECORD NUMBER Referred by [name] concern of inferior retinal breaks OS LEE:  Ocular Hx- PMH-   CURRENT MEDICATIONS: Current Outpatient Medications (Ophthalmic Drugs)  Medication Sig  . prednisoLONE acetate (PRED FORTE) 1 % ophthalmic suspension Place 1 drop into the left eye 4 (four) times daily for 7 days.   No current facility-administered medications for this visit.  (Ophthalmic Drugs)   Current Outpatient Medications (Other)  Medication Sig  . amoxicillin-clavulanate (AUGMENTIN) 875-125 MG tablet Take 1 tablet by mouth 2 (two) times daily. (Patient not taking: Reported on 10/10/2018)  . benzonatate (TESSALON) 100 MG capsule Take 1 capsule (100 mg  total) by mouth 3 (three) times daily. (Patient not taking: Reported on 10/10/2018)  . cetirizine (ZYRTEC) 10 MG tablet Take 10 mg by mouth daily.  . chlorpheniramine-HYDROcodone (TUSSIONEX PENNKINETIC ER) 10-8 MG/5ML SUER Take 5 mLs by mouth every 12 (twelve) hours as needed for cough. (Patient not taking: Reported on 10/10/2018)  . fluconazole (DIFLUCAN) 150 MG tablet Take 1 tablet (150 mg total) by mouth daily. (Patient not taking: Reported on 10/10/2018)  . levonorgestrel (MIRENA, 52 MG,) 20 MCG/24HR IUD Mirena 20 mcg/24 hours (5 yrs) 52 mg intrauterine device  Take 1 device by intrauterine route.  . methylPREDNISolone (MEDROL DOSEPAK) 4 MG TBPK tablet follow package directions (Patient not taking: Reported on 10/10/2018)  . moxifloxacin (AVELOX) 400 MG tablet Take 1 tablet (400 mg total) by mouth daily. (Patient not taking: Reported on 10/10/2018)  . oseltamivir (TAMIFLU) 75 MG capsule Take 1 capsule (75 mg total) by mouth 2 (two) times daily. (Patient not taking: Reported on 10/10/2018)  . OVER THE COUNTER MEDICATION sinus d with pseudoephedrine  . promethazine (PHENERGAN) 25 MG tablet Take 1 tablet (25 mg total) by mouth every 6 (six) hours as needed for nausea or vomiting. (Patient not taking: Reported on 10/10/2018)  . rizatriptan (MAXALT-MLT) 10 MG disintegrating tablet Take 1 tablet (10 mg total) by mouth as needed for migraine. May repeat in 2 hours if needed  . traMADol (ULTRAM) 50 MG tablet Take 1 tablet (50  mg total) by mouth every 6 (six) hours as needed for moderate pain. (Patient not taking: Reported on 10/10/2018)   No current facility-administered medications for this visit.  (Other)      REVIEW OF SYSTEMS: ROS    Positive for: Eyes   Negative for: Constitutional, Gastrointestinal, Neurological, Skin, Genitourinary, Musculoskeletal, HENT, Endocrine, Cardiovascular, Respiratory, Psychiatric, Allergic/Imm, Heme/Lymph   Last edited by Doneen Poisson on 10/10/2018  8:00 AM. (History)        ALLERGIES Allergies  Allergen Reactions  . Other Anaphylaxis and Shortness Of Breath    curry    PAST MEDICAL HISTORY Past Medical History:  Diagnosis Date  . Asthma    ?allergy induced asthma  . Migraines    Past Surgical History:  Procedure Laterality Date  . APPENDECTOMY  2008  . EYE SURGERY  1997  . NOSE SURGERY  2006  . SINUS SURGERY WITH INSTATRAK    . WISDOM TOOTH EXTRACTION  2006    FAMILY HISTORY Family History  Problem Relation Age of Onset  . Anemia Sister   . Thyroid disease Sister   . Headache Sister        ?migraines  . Diabetes Paternal Grandmother   . Hypertension Paternal Grandmother   . Heart disease Paternal Grandmother   . Heart attack Paternal Grandmother   . Diabetes Paternal Grandfather   . Hypertension Paternal Grandfather   . Hypertension Father   . Diabetes Father   . Alzheimer's disease Maternal Grandfather   . Stroke Maternal Grandfather     SOCIAL HISTORY Social History   Tobacco Use  . Smoking status: Never Smoker  . Smokeless tobacco: Never Used  Substance Use Topics  . Alcohol use: Never    Frequency: Never    Comment: socially  . Drug use: No         OPHTHALMIC EXAM:  Base Eye Exam    Visual Acuity (Snellen - Linear)      Right Left   Dist cc 20/20 -3 20/20 -3   Correction: Glasses       Tonometry (Tonopen, 8:19 AM)      Right Left   Pressure 18 16       Pupils      Dark Light Shape React APD   Right 4 3 Round Brisk 0   Left 4 3 Round Brisk 0       Visual Fields      Left Right    Full Full       Extraocular Movement      Right Left    Full Full       Neuro/Psych    Oriented x3: Yes   Mood/Affect: Normal       Dilation    Both eyes: 1.0% Mydriacyl, 2.5% Phenylephrine @ 8:19 AM        Slit Lamp and Fundus Exam    Slit Lamp Exam      Right Left   Lids/Lashes Normal Normal   Conjunctiva/Sclera White and quiet White and quiet   Cornea Clear Clear   Anterior Chamber Deep and  quiet Deep and quiet   Iris Round and dilated Round and dilated   Lens Clear Clear   Vitreous trace vitreous syneresis trace vitreous syneresis       Fundus Exam      Right Left   Disc Pink and sharp Pink and sharp   C/D Ratio 0.3 0.3   Macula Flat, Good foveal reflex Flat, Good foveal  reflex; flat pigmented subfoveal choroidal nevus   Vessels Normal Normal   Periphery Attached, Multiple patches of lattice from 1130-1200, patch of lattice at 7:30 Attached, multiple patches of lattice from 11:00-1:30, pigmented lattice at 4:30,5:00, and 6:00 with atrophic holes        Refraction    Wearing Rx      Sphere Cylinder Axis   Right -7.75 +2.25 012   Left -9.00 +4.00 171       Manifest Refraction      Sphere Cylinder Axis Dist VA   Right -8.25 +3.00 015 20/20-3   Left -8.25 +3.25 175 20/20-2          IMAGING AND PROCEDURES  Imaging and Procedures for @TODAY @  OCT, Retina - OU - Both Eyes       Right Eye Quality was good. Central Foveal Thickness: 272. Progression has no prior data. Findings include normal foveal contour, no IRF, no SRF.   Left Eye Quality was good. Central Foveal Thickness: 315. Progression has no prior data. Findings include normal foveal contour, no IRF, no SRF (subfoveal choroidal hyper-reflective lesion).   Notes *Images captured and stored on drive  Diagnosis / Impression:  NFP; no IRF/SRF OU OS: subfoveal choroidal hyper-reflective lesion  Clinical management:  See below  Abbreviations: NFP - Normal foveal profile. CME - cystoid macular edema. PED - pigment epithelial detachment. IRF - intraretinal fluid. SRF - subretinal fluid. EZ - ellipsoid zone. ERM - epiretinal membrane. ORA - outer retinal atrophy. ORT - outer retinal tubulation. SRHM - subretinal hyper-reflective material        Repair Retinal Breaks, Laser - OS - Left Eye       LASER PROCEDURE NOTE  Procedure:  Barrier laser retinopexy using slit lamp laser, LEFT eye    Diagnosis:   Lattice degeneration w/ atrophic holes, LEFT eye                     Patches of lattice: 1100-0130 superiorly; 0430-0600 inferiorly  Surgeon: Bernarda Caffey, MD, PhD  Anesthesia: Topical  Informed consent obtained, operative eye marked, and time out performed prior to initiation of laser.   Laser settings:  Lumenis Smart532 laser, slit lamp Lens: [place] PRP 165 Power: 260 mW Spot size: 200 microns Duration: 30 msec  # spots: [phone]  Placement of laser: Using a Mainster PRP 165 contact lens at the slit lamp, laser was placed in three confluent rows around patches of lattice w/ atrophic holes from 11-130 superiorly and 430-600 inferiorly, anterior to equator with additional rows anteriorly.  Complications: None.  Patient tolerated the procedure well and received written and verbal post-procedure care information/education.                 ASSESSMENT/PLAN:    ICD-10-CM   1. Lattice degeneration of both retinas  H35.413 Repair Retinal Breaks, Laser - OS - Left Eye  2. Retinal hole of both eyes  H33.323 Repair Retinal Breaks, Laser - OS - Left Eye  3. Retinal edema  H35.81 OCT, Retina - OU - Both Eyes  4. Choroidal nevus of left eye  D31.32   5. Myopia of both eyes with astigmatism  H52.13    H52.203   6. History of strabismus surgery  Z98.890     1,2. Lattice degeneration w/ atrophic holes, both eyes - OD: patches of lattice from 1130-12 and at 0730 - OS: patches of lattice from 11-130 and 430-6 - discussed findings, prognosis, and treatment options including  observation - recommend laser retinopexy OU, OS first - pt wishes to proceed with laser OS - RBA of procedure discussed, questions answered - informed consent obtained and signed - see procedure note - start PF QID OS x7 days - f/u in 1-2 wks, POV OS, laser retinopexy OD  3. No retinal edema on exam or OCT  4. Choroidal nevus OS - flat, pigmented, subfoveal nevus - ~1DD in size - no orange  pigment, SRF or drusen - OCT shows hyperreflective choroidal lesion - discussed findings, prognosis - monitor for change  5. High myopia w/ astigmatism OU - discussed association of high myopia with lattice degeneration and increased risk of RT/RD - RD warnings given - Patient has family history of high myopia (Mother and maternal Grandmother--Grandmother has history of RD and Fuchs Dystrophy--history of cornea transplant )  6.  Hx of strabismus surgery - Hx of muscle surgery w/ Dr. Elliot Dally at age 42 (BCVA 34/20- OU)   Ophthalmic Meds Ordered this visit:  Meds ordered this encounter  Medications  . prednisoLONE acetate (PRED FORTE) 1 % ophthalmic suspension    Sig: Place 1 drop into the left eye 4 (four) times daily for 7 days.    Dispense:  10 mL    Refill:  0       Return for 1-2 wks, POV OS, possible laser OD.  There are no Patient Instructions on file for this visit.   Explained the diagnoses, plan, and follow up with the patient and they expressed understanding.  Patient expressed understanding of the importance of proper follow up care.   This document serves as a record of services personally performed by Gardiner Sleeper, MD, PhD. It was created on their behalf by Ernest Mallick, OA, an ophthalmic assistant. The creation of this record is the provider's dictation and/or activities during the visit.    Electronically signed by: Ernest Mallick, OA  07.30.2020 3:23 PM    Gardiner Sleeper, M.D., Ph.D. Diseases & Surgery of the Retina and Vitreous [facility]  I have reviewed the above documentation for accuracy and completeness, and I agree with the above. Gardiner Sleeper, M.D., Ph.D. 10/10/18 3:23 PM      Abbreviations: M myopia (nearsighted); A astigmatism; H hyperopia (farsighted); P presbyopia; Mrx spectacle prescription;  CTL contact lenses; OD right eye; OS left eye; OU both eyes  XT exotropia; ET esotropia; PEK punctate epithelial keratitis;  PEE punctate epithelial erosions; DES dry eye syndrome; MGD meibomian gland dysfunction; ATs artificial tears; PFAT's preservative free artificial tears; [place] nuclear sclerotic cataract; PSC posterior subcapsular cataract; ERM epi-retinal membrane; PVD posterior vitreous detachment; RD retinal detachment; DM diabetes mellitus; DR diabetic retinopathy; NPDR non-proliferative diabetic retinopathy; PDR proliferative diabetic retinopathy; CSME clinically significant macular edema; DME diabetic macular edema; dbh dot blot hemorrhages; CWS cotton wool spot; POAG primary open angle glaucoma; C/D cup-to-disc ratio; HVF humphrey visual field; GVF goldmann visual field; OCT optical coherence tomography; IOP intraocular pressure; BRVO Branch retinal vein occlusion; CRVO central retinal vein occlusion; CRAO central retinal artery occlusion; BRAO branch retinal artery occlusion; RT retinal tear; SB scleral buckle; PPV pars plana vitrectomy; VH Vitreous hemorrhage; PRP panretinal laser photocoagulation; IVK intravitreal kenalog; VMT vitreomacular traction; MH Macular hole;  NVD neovascularization of the disc; NVE neovascularization elsewhere; AREDS age related eye disease study; ARMD age related macular degeneration; POAG primary open angle glaucoma; EBMD epithelial/anterior basement membrane dystrophy; ACIOL anterior chamber intraocular lens; IOL intraocular lens; PCIOL posterior chamber  intraocular lens; Phaco/IOL phacoemulsification with intraocular lens placement; [place] photorefractive keratectomy; LASIK laser assisted in situ keratomileusis; HTN hypertension; DM diabetes mellitus; COPD chronic obstructive pulmonary disease

## 2018-10-10 ENCOUNTER — Encounter (INDEPENDENT_AMBULATORY_CARE_PROVIDER_SITE_OTHER): Payer: Self-pay | Admitting: Ophthalmology

## 2018-10-10 ENCOUNTER — Ambulatory Visit (INDEPENDENT_AMBULATORY_CARE_PROVIDER_SITE_OTHER): Payer: Commercial Managed Care - PPO | Admitting: Ophthalmology

## 2018-10-10 ENCOUNTER — Other Ambulatory Visit: Payer: Self-pay

## 2018-10-10 DIAGNOSIS — H33323 Round hole, bilateral: Secondary | ICD-10-CM

## 2018-10-10 DIAGNOSIS — H52203 Unspecified astigmatism, bilateral: Secondary | ICD-10-CM

## 2018-10-10 DIAGNOSIS — D3132 Benign neoplasm of left choroid: Secondary | ICD-10-CM | POA: Diagnosis not present

## 2018-10-10 DIAGNOSIS — H5213 Myopia, bilateral: Secondary | ICD-10-CM

## 2018-10-10 DIAGNOSIS — H35413 Lattice degeneration of retina, bilateral: Secondary | ICD-10-CM

## 2018-10-10 DIAGNOSIS — Z9889 Other specified postprocedural states: Secondary | ICD-10-CM

## 2018-10-10 DIAGNOSIS — H3581 Retinal edema: Secondary | ICD-10-CM | POA: Diagnosis not present

## 2018-10-10 MED ORDER — PREDNISOLONE ACETATE 1 % OP SUSP: 1.0000 [drp] | Freq: Four times a day (QID) | OPHTHALMIC | 0 refills | Status: AC

## 2018-10-16 NOTE — Progress Notes (Signed)
[facility] Note  10/17/2018     CHIEF COMPLAINT Patient presents for Post-op Follow-up   HISTORY OF PRESENT ILLNESS: Caitlin Duarte is a 33 y.o. female who presents to the clinic today for:   HPI    Post-op Follow-up    In both eyes.  Discomfort includes Negative for pain, itching, foreign body sensation, tearing, discharge and floaters.  Vision is stable.  I, the attending physician,  performed the HPI with the patient and updated documentation appropriately.          Comments    F/U lattice degen. OU. Patient states her vision "is good", denies new visual onsets/issues. Pt is ready for tx today if indicated.        Last edited by Bernarda Caffey, MD on 10/17/2018  9:35 AM. (History)    Patient states she is doing well, she states she had no problems after the laser last time and is ready to do laser on the right eye today  Referring physician: [name], PA-C [address] [place],   [id number]  HISTORICAL INFORMATION:   Selected notes from the MEDICAL RECORD NUMBER Referred by [name] concern of inferior retinal breaks OS LEE:  Ocular Hx- PMH-   CURRENT MEDICATIONS: Current Outpatient Medications (Ophthalmic Drugs)  Medication Sig  . prednisoLONE acetate (PRED FORTE) 1 % ophthalmic suspension Place 1 drop into the left eye 4 (four) times daily for 7 days.   No current facility-administered medications for this visit.  (Ophthalmic Drugs)   Current Outpatient Medications (Other)  Medication Sig  . amoxicillin-clavulanate (AUGMENTIN) 875-125 MG tablet Take 1 tablet by mouth 2 (two) times daily.  . benzonatate (TESSALON) 100 MG capsule Take 1 capsule (100 mg total) by mouth 3 (three) times daily.  . cetirizine (ZYRTEC) 10 MG tablet Take 10 mg by mouth daily.  . chlorpheniramine-HYDROcodone (TUSSIONEX PENNKINETIC ER) 10-8 MG/5ML SUER Take 5 mLs by mouth every 12 (twelve) hours as needed for cough.  . fluconazole  (DIFLUCAN) 150 MG tablet Take 1 tablet (150 mg total) by mouth daily.  Marland Kitchen levonorgestrel (MIRENA, 52 MG,) 20 MCG/24HR IUD Mirena 20 mcg/24 hours (5 yrs) 52 mg intrauterine device  Take 1 device by intrauterine route.  . methylPREDNISolone (MEDROL DOSEPAK) 4 MG TBPK tablet follow package directions  . moxifloxacin (AVELOX) 400 MG tablet Take 1 tablet (400 mg total) by mouth daily.  Marland Kitchen oseltamivir (TAMIFLU) 75 MG capsule Take 1 capsule (75 mg total) by mouth 2 (two) times daily.  Marland Kitchen OVER THE COUNTER MEDICATION sinus d with pseudoephedrine  . promethazine (PHENERGAN) 25 MG tablet Take 1 tablet (25 mg total) by mouth every 6 (six) hours as needed for nausea or vomiting.  . traMADol (ULTRAM) 50 MG tablet Take 1 tablet (50 mg total) by mouth every 6 (six) hours as needed for moderate pain.  . rizatriptan (MAXALT-MLT) 10 MG disintegrating tablet Take 1 tablet (10 mg total) by mouth as needed for migraine. May repeat in 2 hours if needed   No current facility-administered medications for this visit.  (Other)      REVIEW OF SYSTEMS: ROS    Positive for: Eyes   Negative for: Constitutional, Gastrointestinal, Neurological, Skin, Genitourinary, Musculoskeletal, HENT, Endocrine, Cardiovascular, Respiratory, Psychiatric, Allergic/Imm, Heme/Lymph   Last edited by Zenovia Jordan, LPN on 09/18/8919  1:94 AM. (History)       ALLERGIES Allergies  Allergen Reactions  . Other Anaphylaxis and Shortness Of Breath  curry    PAST MEDICAL HISTORY Past Medical History:  Diagnosis Date  . Asthma    ?allergy induced asthma  . Migraines    Past Surgical History:  Procedure Laterality Date  . APPENDECTOMY  2008  . EYE SURGERY  1997  . NOSE SURGERY  2006  . SINUS SURGERY WITH INSTATRAK    . WISDOM TOOTH EXTRACTION  2006    FAMILY HISTORY Family History  Problem Relation Age of Onset  . Anemia Sister   . Thyroid disease Sister   . Headache Sister        ?migraines  . Diabetes Paternal  Grandmother   . Hypertension Paternal Grandmother   . Heart disease Paternal Grandmother   . Heart attack Paternal Grandmother   . Diabetes Paternal Grandfather   . Hypertension Paternal Grandfather   . Hypertension Father   . Diabetes Father   . Alzheimer's disease Maternal Grandfather   . Stroke Maternal Grandfather     SOCIAL HISTORY Social History   Tobacco Use  . Smoking status: Never Smoker  . Smokeless tobacco: Never Used  Substance Use Topics  . Alcohol use: Never    Frequency: Never    Comment: socially  . Drug use: No         OPHTHALMIC EXAM:  Base Eye Exam    Visual Acuity (Snellen - Linear)      Right Left   Dist cc 20/25 20/30 +2   Dist ph cc 20/20 -1 20/25 +2   Correction: Glasses       Tonometry (Tonopen, 9:10 AM)      Right Left   Pressure 10 12       Pupils      Dark Light Shape React APD   Right 3 2 Round Brisk None   Left 3 2 Round Brisk None       Visual Fields      Left Right    Full        Extraocular Movement      Right Left    Full, Ortho Full, Ortho       Neuro/Psych    Oriented x3: Yes   Mood/Affect: Normal       Dilation    Both eyes: 1.0% Mydriacyl, 2.5% Phenylephrine @ 9:10 AM        Slit Lamp and Fundus Exam    Slit Lamp Exam      Right Left   Lids/Lashes Normal Normal   Conjunctiva/Sclera White and quiet White and quiet   Cornea Clear Clear   Anterior Chamber Deep and quiet Deep and quiet   Iris Round and dilated Round and dilated   Lens Clear Clear   Vitreous trace vitreous syneresis trace vitreous syneresis       Fundus Exam      Right Left   Disc Pink and sharp Pink and sharp   C/D Ratio 0.3 0.3   Macula Flat, Good foveal reflex Flat, Good foveal reflex; flat pigmented subfoveal choroidal nevus   Vessels Normal Normal   Periphery Attached, Multiple patches of lattice from 1130-1200, patch of lattice at 7:30 Attached, multiple patches of lattice from 11:00-1:30, pigmented lattice at 4:30,5:00, and  6:00 with atrophic holes - good early laser changes surrounding all lesions          IMAGING AND PROCEDURES  Imaging and Procedures for @TODAY @  Repair Retinal Breaks, Laser - OD - Right Eye       LASER PROCEDURE NOTE  Procedure:  Barrier laser retinopexy using slit lamp laser, RIGHT eye   Diagnosis:   Lattice degeneration w/ atrophic holes, RIGHT eye                     Patches of lattice: 1130-1200 superiorly; 0600-0730 inferiorly  Surgeon: Bernarda Caffey, MD, PhD  Anesthesia: Topical  Informed consent obtained, operative eye marked, and time out performed prior to initiation of laser.   Laser settings:  Lumenis Smart532 laser, slit lamp Lens: Mainster PRP 165 Power: 250 mW Spot size: 200 microns Duration: 30 msec  # spots: [id number]  Placement of laser: Using a Mainster PRP 165 contact lens at the slit lamp, laser was placed in three confluent rows around patches of lattice w/ atrophic holes at 1130-12 and 6-730 oclock anterior to equator with additional rows anteriorly.  Complications: None.  Patient tolerated the procedure well and received written and verbal post-procedure care information/education.                 ASSESSMENT/PLAN:    ICD-10-CM   1. Lattice degeneration of both retinas  H35.413 Repair Retinal Breaks, Laser - OD - Right Eye  2. Retinal hole of both eyes  H33.323 Repair Retinal Breaks, Laser - OD - Right Eye  3. Retinal edema  H35.81   4. Choroidal nevus of left eye  D31.32   5. Myopia of both eyes with astigmatism  H52.13    H52.203   6. History of strabismus surgery  Z98.890     1,2. Lattice degeneration w/ atrophic holes, both eyes  - OD: patches of lattice from 1130-12 and at 0730  - OS: patches of lattice from 11-130 and 430-6   - discussed findings, prognosis, and treatment options including observation  - s/p laser retinopexy OS (07.31.20) - good early laser changes surrounding all lesions  - recommend laser retinopexy OD today,  08.07.20   - pt wishes to proceed with laser OD  - RBA of procedure discussed, questions answered  - informed consent obtained and signed  - see procedure note  - start PF QID OD x7 days  - f/u in 3 weeks  3. No retinal edema on exam or OCT  4. Choroidal nevus OS  - flat, pigmented, subfoveal nevus  - ~1DD in size  - no orange pigment, SRF or drusen  - OCT shows hyperreflective choroidal lesion  - discussed findings, prognosis  - monitor for change  5. High myopia w/ astigmatism OU  - discussed association of high myopia with lattice degeneration and increased risk of RT/RD  - RD warnings given  - Patient has family history of high myopia (Mother and maternal Grandmother--Grandmother has history of RD and Fuchs Dystrophy--history of cornea transplant )  6.  Hx of strabismus surgery  - Hx of muscle surgery w/ Dr. Elliot Dally at age 70 (BCVA 77/20- OU)   Ophthalmic Meds Ordered this visit:  No orders of the defined types were placed in this encounter.      Return in about 3 weeks (around 11/07/2018) for f/u lattice dengeneration OU, DFE, OCT.  There are no Patient Instructions on file for this visit.   Explained the diagnoses, plan, and follow up with the patient and they expressed understanding.  Patient expressed understanding of the importance of proper follow up care.   This document serves as a record of services personally performed by Gardiner Sleeper, MD, PhD. It was created on their behalf by Ernest Mallick, OA,  an ophthalmic assistant. The creation of this record is the provider's dictation and/or activities during the visit.    Electronically signed by: Ernest Mallick, OA 08.06.2020 2:07 PM     Gardiner Sleeper, M.D., Ph.D. Diseases & Surgery of the Retina and Vitreous [facility]  I have reviewed the above documentation for accuracy and completeness, and I agree with the above. Gardiner Sleeper, M.D., Ph.D. 10/17/18 2:07  PM       Abbreviations: M myopia (nearsighted); A astigmatism; H hyperopia (farsighted); P presbyopia; Mrx spectacle prescription;  CTL contact lenses; OD right eye; OS left eye; OU both eyes  XT exotropia; ET esotropia; PEK punctate epithelial keratitis; PEE punctate epithelial erosions; DES dry eye syndrome; MGD meibomian gland dysfunction; ATs artificial tears; PFAT's preservative free artificial tears; [place] nuclear sclerotic cataract; PSC posterior subcapsular cataract; ERM epi-retinal membrane; PVD posterior vitreous detachment; RD retinal detachment; DM diabetes mellitus; DR diabetic retinopathy; NPDR non-proliferative diabetic retinopathy; PDR proliferative diabetic retinopathy; CSME clinically significant macular edema; DME diabetic macular edema; dbh dot blot hemorrhages; CWS cotton wool spot; POAG primary open angle glaucoma; C/D cup-to-disc ratio; HVF humphrey visual field; GVF goldmann visual field; OCT optical coherence tomography; IOP intraocular pressure; BRVO Branch retinal vein occlusion; CRVO central retinal vein occlusion; CRAO central retinal artery occlusion; BRAO branch retinal artery occlusion; RT retinal tear; SB scleral buckle; PPV pars plana vitrectomy; VH Vitreous hemorrhage; PRP panretinal laser photocoagulation; IVK intravitreal kenalog; VMT vitreomacular traction; MH Macular hole;  NVD neovascularization of the disc; NVE neovascularization elsewhere; AREDS age related eye disease study; ARMD age related macular degeneration; POAG primary open angle glaucoma; EBMD epithelial/anterior basement membrane dystrophy; ACIOL anterior chamber intraocular lens; IOL intraocular lens; PCIOL posterior chamber intraocular lens; Phaco/IOL phacoemulsification with intraocular lens placement; [place] photorefractive keratectomy; LASIK laser assisted in situ keratomileusis; HTN hypertension; DM diabetes mellitus; COPD chronic obstructive pulmonary disease

## 2018-10-17 ENCOUNTER — Encounter (INDEPENDENT_AMBULATORY_CARE_PROVIDER_SITE_OTHER): Payer: Self-pay | Admitting: Ophthalmology

## 2018-10-17 ENCOUNTER — Ambulatory Visit (INDEPENDENT_AMBULATORY_CARE_PROVIDER_SITE_OTHER): Payer: Commercial Managed Care - PPO | Admitting: Ophthalmology

## 2018-10-17 ENCOUNTER — Other Ambulatory Visit: Payer: Self-pay

## 2018-10-17 DIAGNOSIS — H5213 Myopia, bilateral: Secondary | ICD-10-CM

## 2018-10-17 DIAGNOSIS — H33323 Round hole, bilateral: Secondary | ICD-10-CM

## 2018-10-17 DIAGNOSIS — H35413 Lattice degeneration of retina, bilateral: Secondary | ICD-10-CM | POA: Diagnosis not present

## 2018-10-17 DIAGNOSIS — D3132 Benign neoplasm of left choroid: Secondary | ICD-10-CM

## 2018-10-17 DIAGNOSIS — H3581 Retinal edema: Secondary | ICD-10-CM

## 2018-10-17 DIAGNOSIS — H52203 Unspecified astigmatism, bilateral: Secondary | ICD-10-CM

## 2018-10-17 DIAGNOSIS — Z9889 Other specified postprocedural states: Secondary | ICD-10-CM

## 2018-11-06 ENCOUNTER — Telehealth: Payer: Self-pay | Admitting: Family Medicine

## 2018-11-06 NOTE — Telephone Encounter (Signed)
Our office received a e-mail that patient wanted to establish care at [facility]. I left message on patient voicemail that due to her being established at [facility] with Dr. Birdie Riddle she would have to call that office and request a transfer to [facility] as a courtesy to her PCP. Patient would also have to let us know which provider she would like to see here ([facility]) so the message could be forwarded to that provider for approval.

## 2018-11-07 ENCOUNTER — Ambulatory Visit: Payer: Commercial Managed Care - PPO | Admitting: Family Medicine

## 2018-11-11 ENCOUNTER — Ambulatory Visit (INDEPENDENT_AMBULATORY_CARE_PROVIDER_SITE_OTHER): Payer: Commercial Managed Care - PPO | Admitting: Physician Assistant

## 2018-11-11 ENCOUNTER — Other Ambulatory Visit: Payer: Self-pay

## 2018-11-11 ENCOUNTER — Encounter: Payer: Self-pay | Admitting: Physician Assistant

## 2018-11-11 VITALS — BP 110/80 | HR 101 | Temp 99.4°F | Resp 14 | Ht 64.0 in | Wt 141.0 lb

## 2018-11-11 DIAGNOSIS — Z0000 Encounter for general adult medical examination without abnormal findings: Secondary | ICD-10-CM

## 2018-11-11 DIAGNOSIS — Z23 Encounter for immunization: Secondary | ICD-10-CM

## 2018-11-11 MED ORDER — RIZATRIPTAN BENZOATE 10 MG PO TBDP: 10.0000 mg | ORAL_TABLET | ORAL | 0 refills | Status: DC | PRN

## 2018-11-11 NOTE — Patient Instructions (Signed)
Please go to the lab for blood work.   Our office will call you with your results unless you have chosen to receive results via MyChart.  If your blood work is normal we will follow-up each year for physicals and as scheduled for chronic medical problems.  If anything is abnormal we will treat accordingly and get you in for a follow-up.   Preventive Care 21-31 Years Old, Female Preventive care refers to visits with your health care provider and lifestyle choices that can promote health and wellness. This includes:  A yearly physical exam. This may also be called an annual well check.  Regular dental visits and eye exams.  Immunizations.  Screening for certain conditions.  Healthy lifestyle choices, such as eating a healthy diet, getting regular exercise, not using drugs or products that contain nicotine and tobacco, and limiting alcohol use. What can I expect for my preventive care visit? Physical exam Your health care provider will check your:  Height and weight. This may be used to calculate body mass index (BMI), which tells if you are at a healthy weight.  Heart rate and blood pressure.  Skin for abnormal spots. Counseling Your health care provider may ask you questions about your:  Alcohol, tobacco, and drug use.  Emotional well-being.  Home and relationship well-being.  Sexual activity.  Eating habits.  Work and work environment.  Method of birth control.  Menstrual cycle.  Pregnancy history. What immunizations do I need?  Influenza (flu) vaccine  This is recommended every year. Tetanus, diphtheria, and pertussis (Tdap) vaccine  You may need a Td booster every 10 years. Varicella (chickenpox) vaccine  You may need this if you have not been vaccinated. Human papillomavirus (HPV) vaccine  If recommended by your health care provider, you may need three doses over 6 months. Measles, mumps, and rubella (MMR) vaccine  You may need at least one dose of  MMR. You may also need a second dose. Meningococcal conjugate (MenACWY) vaccine  One dose is recommended if you are age 19-21 years and a first-year college student living in a residence hall, or if you have one of several medical conditions. You may also need additional booster doses. Pneumococcal conjugate (PCV13) vaccine  You may need this if you have certain conditions and were not previously vaccinated. Pneumococcal polysaccharide (PPSV23) vaccine  You may need one or two doses if you smoke cigarettes or if you have certain conditions. Hepatitis A vaccine  You may need this if you have certain conditions or if you travel or work in places where you may be exposed to hepatitis A. Hepatitis B vaccine  You may need this if you have certain conditions or if you travel or work in places where you may be exposed to hepatitis B. Haemophilus influenzae type b (Hib) vaccine  You may need this if you have certain conditions. You may receive vaccines as individual doses or as more than one vaccine together in one shot (combination vaccines). Talk with your health care provider about the risks and benefits of combination vaccines. What tests do I need?  Blood tests  Lipid and cholesterol levels. These may be checked every 5 years starting at age 20.  Hepatitis C test.  Hepatitis B test. Screening  Diabetes screening. This is done by checking your blood sugar (glucose) after you have not eaten for a while (fasting).  Sexually transmitted disease (STD) testing.  BRCA-related cancer screening. This may be done if you have a family history of   breast, ovarian, tubal, or peritoneal cancers.  Pelvic exam and Pap test. This may be done every 3 years starting at age 69. Starting at age 11, this may be done every 5 years if you have a Pap test in combination with an HPV test. Talk with your health care provider about your test results, treatment options, and if necessary, the need for more  tests. Follow these instructions at home: Eating and drinking   Eat a diet that includes fresh fruits and vegetables, whole grains, lean protein, and low-fat dairy.  Take vitamin and mineral supplements as recommended by your health care provider.  Do not drink alcohol if: ? Your health care provider tells you not to drink. ? You are pregnant, may be pregnant, or are planning to become pregnant.  If you drink alcohol: ? Limit how much you have to 0-1 drink a day. ? Be aware of how much alcohol is in your drink. In the [place] one drink equals one 12 oz bottle of beer (355 mL), one 5 oz glass of wine (148 mL), or one 1 oz glass of hard liquor (44 mL). Lifestyle  Take daily care of your teeth and gums.  Stay active. Exercise for at least 30 minutes on 5 or more days each week.  Do not use any products that contain nicotine or tobacco, such as cigarettes, e-cigarettes, and chewing tobacco. If you need help quitting, ask your health care provider.  If you are sexually active, practice safe sex. Use a condom or other form of birth control (contraception) in order to prevent pregnancy and STIs (sexually transmitted infections). If you plan to become pregnant, see your health care provider for a preconception visit. What's next?  Visit your health care provider once a year for a well check visit.  Ask your health care provider how often you should have your eyes and teeth checked.  Stay up to date on all vaccines. This information is not intended to replace advice given to you by your health care provider. Make sure you discuss any questions you have with your health care provider. Document Released: 04/24/2001 Document Revised: 11/07/2017 Document Reviewed: 11/07/2017 Elsevier Patient Education  2020 [employer]. .

## 2018-11-11 NOTE — Progress Notes (Signed)
Patient presents to clinic today for annual exam.  Patient is not fasting for labs. Patient asking for refill of Maxalt for migraines until she can get back in with her Neurologist.   Diet -- Endorses well-balanced diet overall.  Health Maintenance: Immunizations -- Agrees to flu shot today.  PAP -- UTD  Past Medical History:  Diagnosis Date  . Asthma    ?allergy induced asthma  . Migraines     Past Surgical History:  Procedure Laterality Date  . APPENDECTOMY  2008  . EYE SURGERY  1997  . NOSE SURGERY  2006  . RETINAL LASER PROCEDURE    . SINUS SURGERY WITH INSTATRAK    . TONSILLECTOMY    . WISDOM TOOTH EXTRACTION  2006    Current Outpatient Medications on File Prior to Visit  Medication Sig Dispense Refill  . cetirizine (ZYRTEC) 10 MG tablet Take 10 mg by mouth daily.    Marland Kitchen levonorgestrel (MIRENA, 52 MG,) 20 MCG/24HR IUD Mirena 20 mcg/24 hours (5 yrs) 52 mg intrauterine device  Take 1 device by intrauterine route.    . rizatriptan (MAXALT-MLT) 10 MG disintegrating tablet Take 1 tablet (10 mg total) by mouth as needed for migraine. May repeat in 2 hours if needed 10 tablet 6  . traMADol (ULTRAM) 50 MG tablet Take 1 tablet (50 mg total) by mouth every 6 (six) hours as needed for moderate pain. 30 tablet 0   No current facility-administered medications on file prior to visit.     Allergies  Allergen Reactions  . Other Anaphylaxis and Shortness Of Breath    curry    Family History  Problem Relation Age of Onset  . Anemia Sister   . Thyroid disease Sister   . Headache Sister        ?migraines  . Diabetes Paternal Grandmother   . Hypertension Paternal Grandmother   . Heart disease Paternal Grandmother   . Heart attack Paternal Grandmother   . Diabetes Paternal Grandfather   . Hypertension Paternal Grandfather   . Hypertension Father   . Diabetes Father   . Alzheimer's disease Maternal Grandfather   . Stroke Maternal Grandfather     Social History    Socioeconomic History  . Marital status: Married    Spouse name: Not on file  . Number of children: 2  . Years of education: Not on file  . Highest education level: Bachelor's degree (e.g., BA, AB, BS)  Occupational History  . Not on file  Social Needs  . Financial resource strain: Not on file  . Food insecurity    Worry: Not on file    Inability: Not on file  . Transportation needs    Medical: Not on file    Non-medical: Not on file  Tobacco Use  . Smoking status: Never Smoker  . Smokeless tobacco: Never Used  Substance and Sexual Activity  . Alcohol use: Never    Frequency: Never    Comment: socially  . Drug use: No  . Sexual activity: Yes    Birth control/protection: I.U.D.  Lifestyle  . Physical activity    Days per week: Not on file    Minutes per session: Not on file  . Stress: Not on file  Relationships  . Social Herbalist on phone: Not on file    Gets together: Not on file    Attends religious service: Not on file    Active member of club or organization: Not on file  Attends meetings of clubs or organizations: Not on file    Relationship status: Not on file  . Intimate partner violence    Fear of current or ex partner: Not on file    Emotionally abused: Not on file    Physically abused: Not on file    Forced sexual activity: Not on file  Other Topics Concern  . Not on file  Social History Narrative   Lives at home with her husband and 2 sons   Right handed    Caffeine: 1 cup daily    Review of Systems  Constitutional: Negative for fever and weight loss.  HENT: Negative for ear discharge, ear pain, hearing loss and tinnitus.   Eyes: Negative for blurred vision, double vision, photophobia and pain.  Respiratory: Negative for cough and shortness of breath.   Cardiovascular: Negative for chest pain and palpitations.  Gastrointestinal: Negative for abdominal pain, blood in stool, constipation, diarrhea, heartburn, melena, nausea and  vomiting.  Genitourinary: Negative for dysuria, flank pain, frequency, hematuria and urgency.  Musculoskeletal: Negative for falls.  Neurological: Negative for dizziness, loss of consciousness and headaches.  Endo/Heme/Allergies: Negative for environmental allergies.  Psychiatric/Behavioral: Negative for depression, hallucinations, substance abuse and suicidal ideas. The patient is not nervous/anxious and does not have insomnia.    Temp 99.4 F (37.4 C) (Skin)   Wt 141 lb (64 kg)   BMI 24.20 kg/m   Physical Exam Vitals signs reviewed.  HENT:     Head: Normocephalic and atraumatic.     Right Ear: Tympanic membrane, ear canal and external ear normal.     Left Ear: Tympanic membrane, ear canal and external ear normal.     Nose: Nose normal. No mucosal edema.     Mouth/Throat:     Pharynx: Uvula midline. No oropharyngeal exudate or posterior oropharyngeal erythema.  Eyes:     Conjunctiva/sclera: Conjunctivae normal.     Pupils: Pupils are equal, round, and reactive to light.  Neck:     Musculoskeletal: Neck supple.     Thyroid: No thyromegaly.  Cardiovascular:     Rate and Rhythm: Normal rate and regular rhythm.     Heart sounds: Normal heart sounds.  Pulmonary:     Effort: Pulmonary effort is normal. No respiratory distress.     Breath sounds: Normal breath sounds. No wheezing or rales.  Abdominal:     General: Bowel sounds are normal. There is no distension.     Palpations: Abdomen is soft. There is no mass.     Tenderness: There is no abdominal tenderness. There is no guarding or rebound.  Lymphadenopathy:     Cervical: No cervical adenopathy.  Skin:    General: Skin is warm and dry.     Findings: No rash.  Neurological:     Mental Status: She is alert and oriented to person, place, and time.     Cranial Nerves: No cranial nerve deficit.    Recent Results (from the past 2160 hour(s))  HM PAP SMEAR     Status: None   Collection Time: 09/09/18 12:00 AM  Result Value  Ref Range   HM Pap smear completed     Assessment/Plan: 1. Visit for preventive health examination Depression screen negative. Health Maintenance reviewed. Preventive schedule discussed and handout given in AVS. Will obtain fasting labs today.  2. Need for influenza vaccination Flu shot given today   Leeanne Rio, PA-C

## 2018-11-12 LAB — LIPID PANEL
Cholesterol: 156 mg/dL (ref 0–200)
HDL: 55.2 mg/dL (ref >39.00)
LDL Cholesterol: 72 mg/dL (ref 0–99)
NonHDL: 100.33
Total CHOL/HDL Ratio: 3
Triglycerides: 140 mg/dL (ref 0.0–149.0)
VLDL: 28 mg/dL (ref 0.0–40.0)

## 2018-11-12 LAB — COMPREHENSIVE METABOLIC PANEL
ALT: 22 U/L (ref 0–35)
AST: 16 U/L (ref 0–37)
Albumin: 4.8 g/dL (ref 3.5–5.2)
Alkaline Phosphatase: 69 U/L (ref 39–117)
BUN: 10 mg/dL (ref 6–23)
CO2: 27 mEq/L (ref 19–32)
Calcium: 9.6 mg/dL (ref 8.4–10.5)
Chloride: 106 mEq/L (ref 96–112)
Creatinine, Ser: 0.46 mg/dL (ref 0.40–1.20)
GFR: 157.86 mL/min (ref >60.00)
Glucose, Bld: 89 mg/dL (ref 70–99)
Potassium: 3.9 mEq/L (ref 3.5–5.1)
Sodium: 139 mEq/L (ref 135–145)
Total Bilirubin: 0.3 mg/dL (ref 0.2–1.2)
Total Protein: 6.4 g/dL (ref 6.0–8.3)

## 2018-11-12 LAB — CBC WITH DIFFERENTIAL/PLATELET
Basophils Absolute: 0.1 10*3/uL (ref 0.0–0.1)
Basophils Relative: 0.9 % (ref 0.0–3.0)
Eosinophils Absolute: 0.2 10*3/uL (ref 0.0–0.7)
Eosinophils Relative: 2.1 % (ref 0.0–5.0)
HCT: 39.2 % (ref 36.0–46.0)
Hemoglobin: 13.3 g/dL (ref 12.0–15.0)
Lymphocytes Relative: 33.8 % (ref 12.0–46.0)
Lymphs Abs: 3.3 10*3/uL (ref 0.7–4.0)
MCHC: 33.8 g/dL (ref 30.0–36.0)
MCV: 90.3 fl (ref 78.0–100.0)
Monocytes Absolute: 0.7 10*3/uL (ref 0.1–1.0)
Monocytes Relative: 7.5 % (ref 3.0–12.0)
Neutro Abs: 5.4 10*3/uL (ref 1.4–7.7)
Neutrophils Relative %: 55.7 % (ref 43.0–77.0)
Platelets: 365 10*3/uL (ref 150.0–400.0)
RBC: 4.34 Mil/uL (ref 3.87–5.11)
RDW: 13.4 % (ref 11.5–15.5)
WBC: 9.6 10*3/uL (ref 4.0–10.5)

## 2018-11-12 LAB — HEMOGLOBIN A1C: Hgb A1c MFr Bld: 5.3 % (ref 4.6–6.5)

## 2018-12-16 NOTE — Progress Notes (Deleted)
[id number]    Provider:  Dr Jaynee Eagles Referring Provider: Billey Chang, MD Primary Care Physician:  Billey Chang, MD  CC:  migraines  HPI:  Caitlin Duarte is a 33 y.o. female here as requested by Billey Chang, MDfor migraines. Migraines worsening. She has a 27 month old. Migraine started in high school. She didn't have many migraines after her first child she breast fed for a year and became pregnant. She has an IUD. She started having heavy periods in August and those are when she started having worsening migraines. She had the worst headache recently. She had MRIs in the past which were normal. She started a new job. She stopped breastfeeding. She has 3 migraines a month and can each last 3 days. They can be anytime, not related to her monthly menses. She takes Maxalt. Excedrin in the past. Migraines throbbing, pain, left sided usually, +light/sound sensitivity, nausea, vomiting. Lasted 3 days and did not respond to acute treatment. She can have "floaters" before her migraines but often no aura, she can associated ear pain and hearing changes.   Reviewed notes, labs and imaging from outside physicians, which showed   CT head 03/2014 showed No acute intracranial abnormalities including mass lesion or mass effect, hydrocephalus, extra-axial fluid collection, midline shift, hemorrhage, or acute infarction, large ischemic events (personally reviewed images)  Reviewed Dr. Johney Frame notes.  Patient has 2 children youngest child is 80 months and she is currently breast-feeding.  Patient presents due to her intractable migraine.  Started over 24 hours prior to appointment.  Associated photophobia, phonophobia, nausea with vomiting, confusion with an aura with left-sided visual changes that are typical for her headaches.  Prior to that her last migraine was about a month ago.  Prior to that they have been rare.  She is used Maxalt in the past.  She is use Excedrin Migraine.  She was treated  with Toradol and Phenergan in the office without significant relief.  She was sent home with Zofran and tramadol.   Review of Systems: Patient complains of symptoms per HPI as well as the following symptoms: headache. Pertinent negatives and positives per HPI. All others negative.   Social History   Socioeconomic History  . Marital status: Married    Spouse name: Not on file  . Number of children: 2  . Years of education: Not on file  . Highest education level: Bachelor's degree (e.g., BA, AB, BS)  Occupational History  . Not on file  Social Needs  . Financial resource strain: Not on file  . Food insecurity    Worry: Not on file    Inability: Not on file  . Transportation needs    Medical: Not on file    Non-medical: Not on file  Tobacco Use  . Smoking status: Never Smoker  . Smokeless tobacco: Never Used  Substance and Sexual Activity  . Alcohol use: Never    Frequency: Never    Comment: socially  . Drug use: No  . Sexual activity: Yes    Birth control/protection: I.U.D.  Lifestyle  . Physical activity    Days per week: Not on file    Minutes per session: Not on file  . Stress: Not on file  Relationships  . Social Herbalist on phone: Not on file    Gets together: Not on file    Attends religious service: Not on file    Active member of club or organization: Not on file  Attends meetings of clubs or organizations: Not on file    Relationship status: Not on file  . Intimate partner violence    Fear of current or ex partner: Not on file    Emotionally abused: Not on file    Physically abused: Not on file    Forced sexual activity: Not on file  Other Topics Concern  . Not on file  Social History Narrative   Lives at home with her husband and 2 sons   Right handed    Caffeine: 1 cup daily    Family History  Problem Relation Age of Onset  . Anemia Sister   . Thyroid disease Sister   . Headache Sister        ?migraines  . Diabetes Paternal  Grandmother   . Hypertension Paternal Grandmother   . Heart disease Paternal Grandmother   . Heart attack Paternal Grandmother   . Diabetes Paternal Grandfather   . Hypertension Paternal Grandfather   . Hypertension Father   . Diabetes Father   . Alzheimer's disease Maternal Grandfather   . Stroke Maternal Grandfather     Past Medical History:  Diagnosis Date  . Asthma    ?allergy induced asthma  . Migraines     Past Surgical History:  Procedure Laterality Date  . APPENDECTOMY  2008  . EYE SURGERY  1997  . NOSE SURGERY  2006  . RETINAL LASER PROCEDURE    . SINUS SURGERY WITH INSTATRAK    . TONSILLECTOMY    . WISDOM TOOTH EXTRACTION  2006    Current Outpatient Medications  Medication Sig Dispense Refill  . cetirizine (ZYRTEC) 10 MG tablet Take 10 mg by mouth daily.    Marland Kitchen levonorgestrel (MIRENA, 52 MG,) 20 MCG/24HR IUD Mirena 20 mcg/24 hours (5 yrs) 52 mg intrauterine device  Take 1 device by intrauterine route.    . rizatriptan (MAXALT-MLT) 10 MG disintegrating tablet Take 1 tablet (10 mg total) by mouth as needed for migraine. May repeat in 2 hours if needed 10 tablet 0  . traMADol (ULTRAM) 50 MG tablet Take 1 tablet (50 mg total) by mouth every 6 (six) hours as needed for moderate pain. 30 tablet 0   No current facility-administered medications for this visit.     Allergies as of 12/17/2018 - Review Complete 11/11/2018  Allergen Reaction Noted  . Other Anaphylaxis and Shortness Of Breath 09/15/2012    Vitals: There were no vitals taken for this visit. Last Weight:  Wt Readings from Last 1 Encounters:  11/11/18 141 lb (64 kg)   Last Height:   Ht Readings from Last 1 Encounters:  11/11/18 5\' 4"  (1.626 m)    Physical exam: Exam: Gen: NAD, conversant, well nourised, well groomed                     CV: RRR, no MRG. No Carotid Bruits. No peripheral edema, warm, nontender Eyes: Conjunctivae clear without exudates or hemorrhage  Neuro: Detailed Neurologic Exam   Speech:    Speech is normal; fluent and spontaneous with normal comprehension.  Cognition:    The patient is oriented to person, place, and time;     recent and remote memory intact;     language fluent;     normal attention, concentration,     fund of knowledge Cranial Nerves:    The pupils are equal, round, and reactive to light. The fundi are normal and spontaneous venous pulsations are present. Visual fields are full to  finger confrontation. Extraocular movements are intact. Trigeminal sensation is intact and the muscles of mastication are normal. The face is symmetric. The palate elevates in the midline. Hearing intact. Voice is normal. Shoulder shrug is normal. The tongue has normal motion without fasciculations.   Coordination:    Normal finger to nose and heel to shin. Normal rapid alternating movements.   Gait:    Heel-toe and tandem gait are normal.   Motor Observation:    No asymmetry, no atrophy, and no involuntary movements noted. Tone:    Normal muscle tone.    Posture:    Posture is normal. normal erect    Strength:    Strength is V/V in the upper and lower limbs.      Sensation: intact to LT     Reflex Exam:  DTR's:    Deep tendon reflexes in the upper and lower extremities are normal bilaterally.   Toes:    The toes are downgoing bilaterally.   Clonus:    Clonus is absent.      Assessment/Plan:  33 year old with likely migraines but needs thorough evaluation  OBGYn and discuss NuvaRing She just stopped breastfeeding. MRI brain w/wo contrast: MRI brain due to concerning symptoms of morning headaches, positional headaches,vision changes  to look for space occupying mass, chiari or intracranial hypertension (pseudotumor). Labs  No orders of the defined types were placed in this encounter.  Cc: Billey Chang, MD  Sarina Ill, MD  [facility] [address] [place], [place] [address]  Phone [phone] Fax  [phone]

## 2018-12-17 ENCOUNTER — Ambulatory Visit: Payer: Commercial Managed Care - PPO | Admitting: Neurology

## 2018-12-18 ENCOUNTER — Encounter: Payer: Self-pay | Admitting: Neurology

## 2019-01-16 NOTE — Progress Notes (Addendum)
[facility] Note  01/20/2019     CHIEF COMPLAINT Patient presents for Retina Follow Up   HISTORY OF PRESENT ILLNESS: Caitlin Duarte is a 33 y.o. female who presents to the clinic today for:   HPI    Retina Follow Up    Patient presents with  Other.  In both eyes.  This started months ago.  Severity is mild.  Duration of 3 months.  Since onset it is stable.  I, the attending physician,  performed the HPI with the patient and updated documentation appropriately.          Comments    33 y/o female pt here for 3 mo f/u s/p laser retinopexy for lattice degeneration OU.  Pt was supposed to return 3 wks after last visit on 8.7.20, but couldn't due to scheduling conflicts.  Pt doing well, and reports no changes in [place] OU.  Denies pain, flashes, floaters.  No gtts.       Last edited by Bernarda Caffey, MD on 01/20/2019  8:10 AM. (History)    Patient states she is doing well since last visit   Referring physician: [name], PA-C [address] [place],  [place] [address]  HISTORICAL INFORMATION:   Selected notes from the MEDICAL RECORD NUMBER Referred by [name] concern of inferior retinal breaks OS   CURRENT MEDICATIONS: No current outpatient medications on file. (Ophthalmic Drugs)   No current facility-administered medications for this visit.  (Ophthalmic Drugs)   Current Outpatient Medications (Other)  Medication Sig  . cetirizine (ZYRTEC) 10 MG tablet Take 10 mg by mouth daily.  Marland Kitchen levonorgestrel (MIRENA, 52 MG,) 20 MCG/24HR IUD Mirena 20 mcg/24 hours (5 yrs) 52 mg intrauterine device  Take 1 device by intrauterine route.  . rizatriptan (MAXALT-MLT) 10 MG disintegrating tablet Take 1 tablet (10 mg total) by mouth as needed for migraine. May repeat in 2 hours if needed  . traMADol (ULTRAM) 50 MG tablet Take 1 tablet (50 mg total) by mouth every 6 (six) hours as needed for moderate pain.   No current facility-administered medications  for this visit.  (Other)      REVIEW OF SYSTEMS: ROS    Positive for: Gastrointestinal, Eyes, Respiratory   Negative for: Constitutional, Neurological, Skin, Genitourinary, Musculoskeletal, HENT, Endocrine, Cardiovascular, Psychiatric, Allergic/Imm, Heme/Lymph   Last edited by Matthew Folks, COA on 01/20/2019  7:58 AM. (History)       ALLERGIES Allergies  Allergen Reactions  . Other Anaphylaxis and Shortness Of Breath    curry    PAST MEDICAL HISTORY Past Medical History:  Diagnosis Date  . Asthma    ?allergy induced asthma  . Migraines    Past Surgical History:  Procedure Laterality Date  . APPENDECTOMY  2008  . EYE SURGERY  1997  . NOSE SURGERY  2006  . RETINAL LASER PROCEDURE    . SINUS SURGERY WITH INSTATRAK    . TONSILLECTOMY    . WISDOM TOOTH EXTRACTION  2006    FAMILY HISTORY Family History  Problem Relation Age of Onset  . Anemia Sister   . Thyroid disease Sister   . Headache Sister        ?migraines  . Diabetes Paternal Grandmother   . Hypertension Paternal Grandmother   . Heart disease Paternal Grandmother   . Heart attack Paternal Grandmother   . Diabetes Paternal Grandfather   . Hypertension Paternal Grandfather   . Hypertension Father   .  Diabetes Father   . Alzheimer's disease Maternal Grandfather   . Stroke Maternal Grandfather     SOCIAL HISTORY Social History   Tobacco Use  . Smoking status: Never Smoker  . Smokeless tobacco: Never Used  Substance Use Topics  . Alcohol use: Never    Frequency: Never    Comment: socially  . Drug use: No         OPHTHALMIC EXAM:  Base Eye Exam    Visual Acuity (Snellen - Linear)      Right Left   Dist cc 20/20 -2 20/30 -   Dist ph cc  20/20 -2   Correction: Contacts       Tonometry (Tonopen, 8:01 AM)      Right Left   Pressure 11 11       Pupils      Dark Light Shape React APD   Right 3 2 Round Brisk None   Left 3 2 Round Brisk None       Visual Fields (Counting fingers)       Left Right    Full Full       Extraocular Movement      Right Left    Full, Ortho Full, Ortho       Neuro/Psych    Oriented x3: Yes   Mood/Affect: Normal       Dilation    Both eyes: 1.0% Mydriacyl, 2.5% Phenylephrine @ 8:01 AM        Slit Lamp and Fundus Exam    Slit Lamp Exam      Right Left   Lids/Lashes Normal Normal   Conjunctiva/Sclera White and quiet White and quiet   Cornea trace Debris in tear film trace Debris in tear film   Anterior Chamber Deep and quiet Deep and quiet   Iris Round and dilated Round and dilated   Lens Clear Clear   Vitreous trace vitreous syneresis trace vitreous syneresis       Fundus Exam      Right Left   Disc Pink and sharp Pink and sharp   C/D Ratio 0.3 0.3   Macula Flat, Good foveal reflex Flat, Good foveal reflex; flat pigmented subfoveal choroidal nevus   Vessels Normal Normal   Periphery Attached, Multiple patches of lattice from 1100-100, patch of lattice from 0500-0730 -- good laser surrounding all lesions Attached, multiple patches of lattice from 11:00-1:30, pigmented lattice at 4:30,5:00, and 6:00 with atrophic holes - good laser changes surrounding all lesions          IMAGING AND PROCEDURES  Imaging and Procedures for @TODAY @  OCT, Retina - OU - Both Eyes       Right Eye Quality was good. Central Foveal Thickness: 278. Progression has been stable. Findings include normal foveal contour, no IRF, no SRF.   Left Eye Quality was good. Central Foveal Thickness: 329. Progression has been stable. Findings include normal foveal contour, no IRF, no SRF (subfoveal choroidal hyper-reflective lesion -- stable nevus).   Notes *Images captured and stored on drive  Diagnosis / Impression:  NFP; no IRF/SRF OU OS: subfoveal choroidal hyper-reflective lesion  Clinical management:  See below  Abbreviations: NFP - Normal foveal profile. CME - cystoid macular edema. PED - pigment epithelial detachment. IRF - intraretinal  fluid. SRF - subretinal fluid. EZ - ellipsoid zone. ERM - epiretinal membrane. ORA - outer retinal atrophy. ORT - outer retinal tubulation. SRHM - subretinal hyper-reflective material  ASSESSMENT/PLAN:    ICD-10-CM   1. Lattice degeneration of both retinas  H35.413   2. Retinal hole of both eyes  H33.323   3. Retinal edema  H35.81 OCT, Retina - OU - Both Eyes  4. Choroidal nevus of left eye  D31.32   5. Myopia of both eyes with astigmatism  H52.13    H52.203   6. History of strabismus surgery  Z98.890     1,2. Lattice degeneration w/ atrophic holes, both eyes  - OD: patches of lattice from 1130-12 and at 0730  - OS: patches of lattice from 11-130 and 430-6   - s/p laser retinopexy OS (07.31.20) - good laser changes surrounding all lesions  - s/p  laser retinopexy OD 08.07.20 - good laser surrounding all lesions  - monitor  3. No retinal edema on exam or OCT  4. Choroidal nevus OS  - flat, pigmented, subfoveal nevus  - ~1DD in size  - no orange pigment, SRF or drusen  - OCT shows hyperreflective choroidal lesion  - discussed findings, prognosis  - monitor for change  - f/u 1 year  5. High myopia w/ astigmatism OU  - discussed association of high myopia with lattice degeneration and increased risk of RT/RD  - RD warnings given  - Patient has family history of high myopia (Mother and maternal Grandmother--Grandmother has history of RD and Fuchs Dystrophy--history of cornea transplant )  6.  Hx of strabismus surgery  - Hx of muscle surgery w/ Dr. Elliot Dally at age 4 (BCVA 45/20- OU)   Ophthalmic Meds Ordered this visit:  No orders of the defined types were placed in this encounter.      Return in about 1 year (around 01/20/2020) for f/u choroidal nevus, DFE, OCT.  There are no Patient Instructions on file for this visit.   Explained the diagnoses, plan, and follow up with the patient and they expressed understanding.  Patient expressed  understanding of the importance of proper follow up care.   This document serves as a record of services personally performed by Gardiner Sleeper, MD, PhD. It was created on their behalf by Estill Bakes, COT an ophthalmic technician. The creation of this record is the provider's dictation and/or activities during the visit.    Electronically signed by: Estill Bakes, COT 01/16/19 @ 4:45 PM   This document serves as a record of services personally performed by Gardiner Sleeper, MD, PhD. It was created on their behalf by Ernest Mallick, OA, an ophthalmic assistant. The creation of this record is the provider's dictation and/or activities during the visit.    Electronically signed by: Ernest Mallick, OA 11.10.2020 4:45 PM   Gardiner Sleeper, M.D., Ph.D. Diseases & Surgery of the Retina and [place] 01/24/19  I have reviewed the above documentation for accuracy and completeness, and I agree with the above. Gardiner Sleeper, M.D., Ph.D. 01/24/19 4:45 PM      Abbreviations: M myopia (nearsighted); A astigmatism; H hyperopia (farsighted); P presbyopia; Mrx spectacle prescription;  CTL contact lenses; OD right eye; OS left eye; OU both eyes  XT exotropia; ET esotropia; PEK punctate epithelial keratitis; PEE punctate epithelial erosions; DES dry eye syndrome; MGD meibomian gland dysfunction; ATs artificial tears; PFAT's preservative free artificial tears; [place] nuclear sclerotic cataract; PSC posterior subcapsular cataract; ERM epi-retinal membrane; PVD posterior vitreous detachment; RD retinal detachment; DM diabetes mellitus; DR diabetic retinopathy; NPDR non-proliferative diabetic retinopathy; PDR proliferative diabetic retinopathy; CSME clinically  significant macular edema; DME diabetic macular edema; dbh dot blot hemorrhages; CWS cotton wool spot; POAG primary open angle glaucoma; C/D cup-to-disc ratio; HVF humphrey visual field; GVF goldmann visual field; OCT optical  coherence tomography; IOP intraocular pressure; BRVO Branch retinal vein occlusion; CRVO central retinal vein occlusion; CRAO central retinal artery occlusion; BRAO branch retinal artery occlusion; RT retinal tear; SB scleral buckle; PPV pars plana vitrectomy; VH Vitreous hemorrhage; PRP panretinal laser photocoagulation; IVK intravitreal kenalog; VMT vitreomacular traction; MH Macular hole;  NVD neovascularization of the disc; NVE neovascularization elsewhere; AREDS age related eye disease study; ARMD age related macular degeneration; POAG primary open angle glaucoma; EBMD epithelial/anterior basement membrane dystrophy; ACIOL anterior chamber intraocular lens; IOL intraocular lens; PCIOL posterior chamber intraocular lens; Phaco/IOL phacoemulsification with intraocular lens placement; [place] photorefractive keratectomy; LASIK laser assisted in situ keratomileusis; HTN hypertension; DM diabetes mellitus; COPD chronic obstructive pulmonary disease

## 2019-01-20 ENCOUNTER — Encounter (INDEPENDENT_AMBULATORY_CARE_PROVIDER_SITE_OTHER): Payer: Self-pay | Admitting: Ophthalmology

## 2019-01-20 ENCOUNTER — Ambulatory Visit (INDEPENDENT_AMBULATORY_CARE_PROVIDER_SITE_OTHER): Payer: Commercial Managed Care - PPO | Admitting: Ophthalmology

## 2019-01-20 ENCOUNTER — Encounter: Payer: Self-pay | Admitting: Family Medicine

## 2019-01-20 ENCOUNTER — Telehealth: Payer: Self-pay | Admitting: Family Medicine

## 2019-01-20 DIAGNOSIS — H33323 Round hole, bilateral: Secondary | ICD-10-CM | POA: Diagnosis not present

## 2019-01-20 DIAGNOSIS — Z9889 Other specified postprocedural states: Secondary | ICD-10-CM

## 2019-01-20 DIAGNOSIS — H5213 Myopia, bilateral: Secondary | ICD-10-CM

## 2019-01-20 DIAGNOSIS — H3581 Retinal edema: Secondary | ICD-10-CM | POA: Diagnosis not present

## 2019-01-20 DIAGNOSIS — D3132 Benign neoplasm of left choroid: Secondary | ICD-10-CM | POA: Diagnosis not present

## 2019-01-20 DIAGNOSIS — H35413 Lattice degeneration of retina, bilateral: Secondary | ICD-10-CM | POA: Diagnosis not present

## 2019-01-20 DIAGNOSIS — H52203 Unspecified astigmatism, bilateral: Secondary | ICD-10-CM

## 2019-01-20 NOTE — Telephone Encounter (Signed)
I called patient regarding rescheduling her 11/25 appt due to NP being out. Patient did not answer and VM is full currently. I have sent the patient a letter to advise this change and request she call back to r/s.

## 2019-01-30 ENCOUNTER — Other Ambulatory Visit: Payer: Self-pay

## 2019-01-30 DIAGNOSIS — Z20822 Contact with and (suspected) exposure to covid-19: Secondary | ICD-10-CM

## 2019-02-02 LAB — NOVEL CORONAVIRUS, NAA: SARS-CoV-2, NAA: NOT DETECTED

## 2019-02-04 ENCOUNTER — Ambulatory Visit: Payer: Commercial Managed Care - PPO | Admitting: Family Medicine

## 2019-02-09 ENCOUNTER — Other Ambulatory Visit: Payer: Self-pay | Admitting: Physician Assistant

## 2019-02-09 MED ORDER — RIZATRIPTAN BENZOATE 10 MG PO TBDP: 10.0000 mg | ORAL_TABLET | ORAL | 0 refills | Status: DC | PRN

## 2019-03-09 ENCOUNTER — Telehealth: Payer: Self-pay

## 2019-03-09 ENCOUNTER — Other Ambulatory Visit: Payer: Self-pay

## 2019-03-09 ENCOUNTER — Encounter: Payer: Self-pay | Admitting: Family Medicine

## 2019-03-09 ENCOUNTER — Ambulatory Visit (INDEPENDENT_AMBULATORY_CARE_PROVIDER_SITE_OTHER): Payer: Commercial Managed Care - PPO | Admitting: Family Medicine

## 2019-03-09 VITALS — BP 109/58 | HR 96 | Temp 97.6°F | Ht 64.0 in | Wt 149.6 lb

## 2019-03-09 DIAGNOSIS — G43709 Chronic migraine without aura, not intractable, without status migrainosus: Secondary | ICD-10-CM | POA: Diagnosis not present

## 2019-03-09 MED ORDER — ERENUMAB-AOOE 140 MG/ML ~~LOC~~ SOAJ: 140.0000 mg | SUBCUTANEOUS | 3 refills | Status: DC

## 2019-03-09 NOTE — Patient Instructions (Signed)
We will start Amovig injections every 30 days  Continue Maxalt for abortive therapy  Follow up in 6 months   Erenumab injection What is this medicine? ERENUMAB (e REN ue mab) is used to prevent migraine headaches. This medicine may be used for other purposes; ask your health care provider or pharmacist if you have questions. COMMON BRAND NAME(S): Aimovig What should I tell my health care provider before I take this medicine? They need to know if you have any of these conditions:  an unusual or allergic reaction to erenumab, latex, other medicines, foods, dyes, or preservatives  high blood pressure  pregnant or trying to get pregnant  breast-feeding How should I use this medicine? This medicine is for injection under the skin. You will be taught how to prepare and give this medicine. Use exactly as directed. Take your medicine at regular intervals. Do not take your medicine more often than directed. It is important that you put your used needles and syringes in a special sharps container. Do not put them in a trash can. If you do not have a sharps container, call your pharmacist or healthcare provider to get one. Talk to your pediatrician regarding the use of this medicine in children. Special care may be needed. Overdosage: If you think you have taken too much of this medicine contact a poison control center or emergency room at once. NOTE: This medicine is only for you. Do not share this medicine with others. What if I miss a dose? If you miss a dose, take it as soon as you can. If it is almost time for your next dose, take only that dose. Do not take double or extra doses. What may interact with this medicine? Interactions are not expected. This list may not describe all possible interactions. Give your health care provider a list of all the medicines, herbs, non-prescription drugs, or dietary supplements you use. Also tell them if you smoke, drink alcohol, or use illegal drugs. Some  items may interact with your medicine. What should I watch for while using this medicine? Tell your doctor or healthcare professional if your symptoms do not start to get better or if they get worse. What side effects may I notice from receiving this medicine? Side effects that you should report to your doctor or health care professional as soon as possible:  allergic reactions like skin rash, itching or hives, swelling of the face, lips, or tongue  chest pain  fast, irregular heartbeat  feeling faint or lightheaded  palpitations Side effects that usually do not require medical attention (report these to your doctor or health care professional if they continue or are bothersome):  constipation  muscle cramps  pain, redness, or irritation at site where injected This list may not describe all possible side effects. Call your doctor for medical advice about side effects. You may report side effects to FDA at 1-800-FDA-1088. Where should I keep my medicine? Keep out of the reach of children. You will be instructed on how to store this medicine. Throw away any unused medicine after the expiration date on the label. NOTE: This sheet is a summary. It may not cover all possible information. If you have questions about this medicine, talk to your doctor, pharmacist, or health care provider.  2020 Elsevier/Gold Standard (2018-07-14 15:43:58)    Migraine Headache A migraine headache is a very strong throbbing pain on one side or both sides of your head. This type of headache can also cause other symptoms.  It can last from 4 hours to 3 days. Talk with your doctor about what things may bring on (trigger) this condition. What are the causes? The exact cause of this condition is not known. This condition may be triggered or caused by:  Drinking alcohol.  Smoking.  Taking medicines, such as: ? Medicine used to treat chest pain (nitroglycerin). ? Birth control pills. ? Estrogen. ? Some  blood pressure medicines.  Eating or drinking certain products.  Doing physical activity. Other things that may trigger a migraine headache include:  Having a menstrual period.  Pregnancy.  Hunger.  Stress.  Not getting enough sleep or getting too much sleep.  Weather changes.  Tiredness (fatigue). What increases the risk?  Being 102-53 years old.  Being female.  Having a family history of migraine headaches.  Being Caucasian.  Having depression or anxiety.  Being very overweight. What are the signs or symptoms?  A throbbing pain. This pain may: ? Happen in any area of the head, such as on one side or both sides. ? Make it hard to do daily activities. ? Get worse with physical activity. ? Get worse around bright lights or loud noises.  Other symptoms may include: ? Feeling sick to your stomach (nauseous). ? Vomiting. ? Dizziness. ? Being sensitive to bright lights, loud noises, or smells.  Before you get a migraine headache, you may get warning signs (an aura). An aura may include: ? Seeing flashing lights or having blind spots. ? Seeing bright spots, halos, or zigzag lines. ? Having tunnel vision or blurred vision. ? Having numbness or a tingling feeling. ? Having trouble talking. ? Having weak muscles.  Some people have symptoms after a migraine headache (postdromal phase), such as: ? Tiredness. ? Trouble thinking (concentrating). How is this treated?  Taking medicines that: ? Relieve pain. ? Relieve the feeling of being sick to your stomach. ? Prevent migraine headaches.  Treatment may also include: ? Having acupuncture. ? Avoiding foods that bring on migraine headaches. ? Learning ways to control your body functions (biofeedback). ? Therapy to help you know and deal with negative thoughts (cognitive behavioral therapy). Follow these instructions at home: Medicines  Take over-the-counter and prescription medicines only as told by your  doctor.  Ask your doctor if the medicine prescribed to you: ? Requires you to avoid driving or using heavy machinery. ? Can cause trouble pooping (constipation). You may need to take these steps to prevent or treat trouble pooping:  Drink enough fluid to keep your pee (urine) pale yellow.  Take over-the-counter or prescription medicines.  Eat foods that are high in fiber. These include beans, whole grains, and fresh fruits and vegetables.  Limit foods that are high in fat and sugar. These include fried or sweet foods. Lifestyle  Do not drink alcohol.  Do not use any products that contain nicotine or tobacco, such as cigarettes, e-cigarettes, and chewing tobacco. If you need help quitting, ask your doctor.  Get at least 8 hours of sleep every night.  Limit and deal with stress. General instructions      Keep a journal to find out what may bring on your migraine headaches. For example, write down: ? What you eat and drink. ? How much sleep you get. ? Any change in what you eat or drink. ? Any change in your medicines.  If you have a migraine headache: ? Avoid things that make your symptoms worse, such as bright lights. ? It may help to  lie down in a dark, quiet room. ? Do not drive or use heavy machinery. ? Ask your doctor what activities are safe for you.  Keep all follow-up visits as told by your doctor. This is important. Contact a doctor if:  You get a migraine headache that is different or worse than others you have had.  You have more than 15 headache days in one month. Get help right away if:  Your migraine headache gets very bad.  Your migraine headache lasts longer than 72 hours.  You have a fever.  You have a stiff neck.  You have trouble seeing.  Your muscles feel weak or like you cannot control them.  You start to lose your balance a lot.  You start to have trouble walking.  You pass out (faint).  You have a seizure. Summary  A migraine  headache is a very strong throbbing pain on one side or both sides of your head. These headaches can also cause other symptoms.  This condition may be treated with medicines and changes to your lifestyle.  Keep a journal to find out what may bring on your migraine headaches.  Contact a doctor if you get a migraine headache that is different or worse than others you have had.  Contact your doctor if you have more than 15 headache days in a month. This information is not intended to replace advice given to you by your health care provider. Make sure you discuss any questions you have with your health care provider. Document Released: 12/06/2007 Document Revised: 06/20/2018 Document Reviewed: 04/10/2018 Elsevier Patient Education  2020 [employer].

## 2019-03-09 NOTE — Telephone Encounter (Signed)
Started a PA on CMM for Aimovig 140mg /mL.  Awaiting response.  Key: [id number]

## 2019-03-09 NOTE — Progress Notes (Addendum)
PATIENT: Caitlin Duarte DOB: 1985/10/26  REASON FOR VISIT: follow up HISTORY FROM: patient  Chief Complaint  Patient presents with  . Follow-up    Yearly f/u. Alone. Rm 6. Patient mentioned that her migraines are becoming more frequent. She stated that her last migraines was last Friday/Saturday. She mentioned that when she gets a migraine they last a couple of days.      HISTORY OF PRESENT ILLNESS: Today 03/09/19 Caitlin Duarte is a 33 y.o. female here today for follow up for migraines. She is currently using Maxalt for abortive therapy. Over the past few months, she has noticed more frequent migraines. She hasn't tracked Usually unilateral, pounding. Blurry vision. She has light and sound sensitivity. Sometimes she feels nauseated. Maxalt does normally help after second dose. She had a tonsillectomy this year. She had an eye exam this summer. She was seen by a retina specialist . She does have some concerns of word finding difficulty with migraines. No history of kidney stones. BP usually runs around 100's/50-60's. She has Mirena IUD. She would forget to take OCPs.   HISTORY: (copied from Dr Cathren Laine note on 02/26/2018)  HPI:  Caitlin Duarte is a 33 y.o. female here as requested by Billey Chang, MDfor migraines. Migraines worsening. She has a 79 month old. Migraine started in high school. She didn't have many migraines after her first child she breast fed for a year and became pregnant. She has an IUD. She started having heavy periods in August and those are when she started having worsening migraines. She had the worst headache recently. She had MRIs in the past which were normal. She started a new job. She stopped breastfeeding. She has 3 migraines a month and can each last 3 days. They can be anytime, not related to her monthly menses. She takes Maxalt. Excedrin in the past. Migraines throbbing, pain, left sided usually, +light/sound sensitivity, nausea, vomiting. Lasted 3 days and did not  respond to acute treatment. She can have "floaters" before her migraines but often no aura, she can associated ear pain and hearing changes.   Reviewed notes, labs and imaging from outside physicians, which showed   CT head 03/2014 showed No acute intracranial abnormalities including mass lesion or mass effect, hydrocephalus, extra-axial fluid collection, midline shift, hemorrhage, or acute infarction, large ischemic events (personally reviewed images)  Reviewed Dr. Johney Frame notes.  Patient has 2 children youngest child is 41 months and she is currently breast-feeding.  Patient presents due to her intractable migraine.  Started over 24 hours prior to appointment.  Associated photophobia, phonophobia, nausea with vomiting, confusion with an aura with left-sided visual changes that are typical for her headaches.  Prior to that her last migraine was about a month ago.  Prior to that they have been rare.  She is used Maxalt in the past.  She is use Excedrin Migraine.  She was treated with Toradol and Phenergan in the office without significant relief.  She was sent home with Zofran and tramadol.   REVIEW OF SYSTEMS: Out of a complete 14 system review of symptoms, the patient complains only of the following symptoms, headaches and all other reviewed systems are negative.  ALLERGIES: Allergies  Allergen Reactions  . Other Anaphylaxis and Shortness Of Breath    curry    HOME MEDICATIONS: Outpatient Medications Prior to Visit  Medication Sig Dispense Refill  . cetirizine (ZYRTEC) 10 MG tablet Take 10 mg by mouth daily.    Marland Kitchen levonorgestrel (  MIRENA, 52 MG,) 20 MCG/24HR IUD Mirena 20 mcg/24 hours (5 yrs) 52 mg intrauterine device  Take 1 device by intrauterine route.    . rizatriptan (MAXALT-MLT) 10 MG disintegrating tablet Take 1 tablet (10 mg total) by mouth as needed for migraine. May repeat in 2 hours if needed 10 tablet 0  . traMADol (ULTRAM) 50 MG tablet Take 1 tablet (50 mg total) by mouth  every 6 (six) hours as needed for moderate pain. (Patient taking differently: Take 50 mg by mouth as needed for moderate pain. ) 30 tablet 0   No facility-administered medications prior to visit.    PAST MEDICAL HISTORY: Past Medical History:  Diagnosis Date  . Asthma    ?allergy induced asthma  . Migraines     PAST SURGICAL HISTORY: Past Surgical History:  Procedure Laterality Date  . APPENDECTOMY  2008  . EYE SURGERY  1997  . NOSE SURGERY  2006  . RETINAL LASER PROCEDURE    . SINUS SURGERY WITH INSTATRAK    . TONSILLECTOMY    . WISDOM TOOTH EXTRACTION  2006    FAMILY HISTORY: Family History  Problem Relation Age of Onset  . Anemia Sister   . Thyroid disease Sister   . Headache Sister        ?migraines  . Diabetes Paternal Grandmother   . Hypertension Paternal Grandmother   . Heart disease Paternal Grandmother   . Heart attack Paternal Grandmother   . Diabetes Paternal Grandfather   . Hypertension Paternal Grandfather   . Hypertension Father   . Diabetes Father   . Alzheimer's disease Maternal Grandfather   . Stroke Maternal Grandfather     SOCIAL HISTORY: Social History   Socioeconomic History  . Marital status: Married    Spouse name: Not on file  . Number of children: 2  . Years of education: Not on file  . Highest education level: Bachelor's degree (e.g., BA, AB, BS)  Occupational History  . Not on file  Tobacco Use  . Smoking status: Never Smoker  . Smokeless tobacco: Never Used  Substance and Sexual Activity  . Alcohol use: Never    Comment: socially  . Drug use: No  . Sexual activity: Yes    Birth control/protection: I.U.D.  Other Topics Concern  . Not on file  Social History Narrative   Lives at home with her husband and 2 sons   Right handed    Caffeine: 1 cup daily   Social Determinants of Health   Financial Resource Strain:   . Difficulty of Paying Living Expenses: Not on file  Food Insecurity:   . Worried About Sales executive in the Last Year: Not on file  . Ran Out of Food in the Last Year: Not on file  Transportation Needs:   . Lack of Transportation (Medical): Not on file  . Lack of Transportation (Non-Medical): Not on file  Physical Activity:   . Days of Exercise per Week: Not on file  . Minutes of Exercise per Session: Not on file  Stress:   . Feeling of Stress : Not on file  Social Connections:   . Frequency of Communication with Friends and Family: Not on file  . Frequency of Social Gatherings with Friends and Family: Not on file  . Attends Religious Services: Not on file  . Active Member of Clubs or Organizations: Not on file  . Attends Archivist Meetings: Not on file  . Marital Status: Not on file  Intimate Partner Violence:   . Fear of Current or Ex-Partner: Not on file  . Emotionally Abused: Not on file  . Physically Abused: Not on file  . Sexually Abused: Not on file      PHYSICAL EXAM  Vitals:   03/09/19 0712  BP: (!) 109/58  Pulse: 96  Temp: 97.6 F (36.4 C)  TempSrc: Oral  Weight: 149 lb 9.6 oz (67.9 kg)  Height: 5\' 4"  (1.626 m)   Body mass index is 25.68 kg/m.  Generalized: Well developed, in no acute distress  Cardiology: normal rate and rhythm, no murmur noted Respiratory: clear to auscultation bilaterally Neurological examination  Mentation: Alert oriented to time, place, history taking. Follows all commands speech and language fluent Cranial nerve II-XII: Pupils were equal round reactive to light. Extraocular movements were full, visual field were full on confrontational test. Facial sensation and strength were normal. Uvula tongue midline. Head turning and shoulder shrug  were normal and symmetric. Motor: The motor testing reveals 5 over 5 strength of all 4 extremities. Good symmetric motor tone is noted throughout.  Sensory: Sensory testing is intact to soft touch on all 4 extremities. No evidence of extinction is noted.  Coordination: Cerebellar  testing reveals good finger-nose-finger and heel-to-shin bilaterally.  Gait and station: Gait is normal.   DIAGNOSTIC DATA (LABS, IMAGING, TESTING) - I reviewed patient records, labs, notes, testing and imaging myself where available.  No flowsheet data found.   Lab Results  Component Value Date   WBC 9.6 11/11/2018   HGB 13.3 11/11/2018   HCT 39.2 11/11/2018   MCV 90.3 11/11/2018   PLT 365.0 11/11/2018      Component Value Date/Time   NA 139 11/11/2018 1522   NA 145 (H) 02/26/2018 1538   K 3.9 11/11/2018 1522   CL 106 11/11/2018 1522   CO2 27 11/11/2018 1522   GLUCOSE 89 11/11/2018 1522   BUN 10 11/11/2018 1522   BUN 12 02/26/2018 1538   CREATININE 0.46 11/11/2018 1522   CREATININE 0.65 06/20/2016 1557   CALCIUM 9.6 11/11/2018 1522   PROT 6.4 11/11/2018 1522   PROT 6.6 02/26/2018 1538   ALBUMIN 4.8 11/11/2018 1522   ALBUMIN 4.8 02/26/2018 1538   AST 16 11/11/2018 1522   ALT 22 11/11/2018 1522   ALKPHOS 69 11/11/2018 1522   BILITOT 0.3 11/11/2018 1522   BILITOT 0.3 02/26/2018 1538   GFRNONAA 118 02/26/2018 1538   GFRAA 136 02/26/2018 1538   Lab Results  Component Value Date   CHOL 156 11/11/2018   HDL 55.20 11/11/2018   LDLCALC 72 11/11/2018   TRIG 140.0 11/11/2018   CHOLHDL 3 11/11/2018   Lab Results  Component Value Date   HGBA1C 5.3 11/11/2018   Lab Results  Component Value Date   VITAMINB12 337 11/25/2012   Lab Results  Component Value Date   TSH 1.080 02/26/2018     ASSESSMENT AND PLAN 33 y.o. year old female  has a past medical history of Asthma and Migraines. here with     ICD-10-CM   1. Chronic migraine without aura without status migrainosus, not intractable  G43.709     Anay has experienced more frequent migraines over the past couple of months.  She also has concerns of word finding difficulty.  Blood pressures run on the lower side.  She is concerned about her ability to take medication every day.  She has responded well to CGRP  therapy in the past.  We will start Aimovig 140  mg every 30 days.  I have discussed frequent side effects, proper administration and storage of this medication with Raquel Sarna.  Additional information has been provided in her AVS.  Co-pay card given.  She will continue Maxalt 10 mg as needed for abortive therapy.  Adequate hydration, healthy diet and regular exercise advised.  She will follow-up with me in 6 months, sooner if needed.  She verbalizes understanding and agreement with this plan.   No orders of the defined types were placed in this encounter.    Meds ordered this encounter  Medications  . Erenumab-aooe 140 MG/ML SOAJ    Sig: Inject 140 mg into the skin every 30 (thirty) days.    Dispense:  3 pen    Refill:  3    Order Specific Question:   Supervising Provider    Answer:   Melvenia Beam [id number]      I spent 15 minutes with the patient. 50% of this time was spent counseling and educating patient on plan of care and medications.    Debbora Presto, FNP-C 03/09/2019, 8:06 AM [facility] [address] [place], [place] [address] [phone]  Made any corrections needed, and agree with history, physical, neuro exam,assessment and plan as stated.     Sarina Ill, MD [facility]

## 2019-03-09 NOTE — Telephone Encounter (Signed)
Started a PA for Aimovig 140mg /mL on CMM awaiting response.

## 2019-03-10 ENCOUNTER — Encounter: Payer: Self-pay | Admitting: Family Medicine

## 2019-03-10 ENCOUNTER — Other Ambulatory Visit: Payer: Self-pay | Admitting: *Deleted

## 2019-03-10 MED ORDER — RIZATRIPTAN BENZOATE 10 MG PO TBDP: 10.0000 mg | ORAL_TABLET | ORAL | 5 refills | Status: DC | PRN

## 2019-03-11 ENCOUNTER — Encounter: Payer: Self-pay | Admitting: *Deleted

## 2019-03-18 NOTE — Telephone Encounter (Signed)
Received denial for Aimovig.  Appeal done as pt has met requirements (medication overuse has been considered and she is not taking another CRGP).  Fax confirmation received with appeal letter and ofv note attached.  [phone], [phone].

## 2019-03-19 NOTE — Telephone Encounter (Signed)
Received appeal approval for the Aimovig 140mg /mL. Please see Nurse RN Mercie Eon report below.

## 2019-03-23 NOTE — Telephone Encounter (Signed)
Appeal done and approved for aimovig 09-16-19.  [employer] [facility].  Notified pt, thru mychart.

## 2019-04-06 ENCOUNTER — Encounter: Payer: Self-pay | Admitting: Family Medicine

## 2019-04-08 NOTE — Telephone Encounter (Signed)
Pharmacy called requesting a PA for the following  1.) Medication Name:aimovig  2.) Pharmacy calling:[email or web address]   3.) Follow up information:  Phone number: [phone] Fax number: n/a  name of plan- [employer] [id number]   ID # [id number]   [place] [id number]   yint with [employer] called to advise that the patients insurance allows for another PA. And provided reference [id number]

## 2019-04-09 NOTE — Telephone Encounter (Signed)
I called and spoke to [facility].  She did not approval in system, nothing else needed.

## 2019-05-09 ENCOUNTER — Ambulatory Visit: Payer: Commercial Managed Care - PPO | Attending: Internal Medicine

## 2019-05-09 DIAGNOSIS — Z23 Encounter for immunization: Secondary | ICD-10-CM | POA: Insufficient documentation

## 2019-05-09 NOTE — Progress Notes (Signed)
   Covid-19 Vaccination Clinic  Name:  Caitlin Duarte    MRN: [id number] DOB: 02/08/1986  05/09/2019  Caitlin Duarte was observed post Covid-19 immunization for 15 minutes without incidence. She was provided with Vaccine Information Sheet and instruction to access the V-Safe system.   Caitlin Duarte was instructed to call 911 with any severe reactions post vaccine: Marland Kitchen Difficulty breathing  . Swelling of your face and throat  . A fast heartbeat  . A bad rash all over your body  . Dizziness and weakness    Immunizations Administered    Name Date Dose VIS Date Route   Pfizer COVID-19 Vaccine 05/09/2019  1:55 PM 0.3 mL 02/20/2019 Intramuscular   Manufacturer: [place]   Lot: [id number]   [place]: [id number]

## 2019-05-30 ENCOUNTER — Ambulatory Visit: Payer: Commercial Managed Care - PPO | Attending: Internal Medicine

## 2019-05-30 DIAGNOSIS — Z23 Encounter for immunization: Secondary | ICD-10-CM

## 2019-05-30 NOTE — Progress Notes (Signed)
   Covid-19 Vaccination Clinic  Name:  Caitlin Duarte    MRN: [id number] DOB: 04/12/1985  05/30/2019  [name] was observed post Covid-19 immunization for 15 minutes without incident. She was provided with Vaccine Information Sheet and instruction to access the V-Safe system.   [name] was instructed to call 911 with any severe reactions post vaccine: Marland Kitchen Difficulty breathing  . Swelling of face and throat  . A fast heartbeat  . A bad rash all over body  . Dizziness and weakness   Immunizations Administered    Name Date Dose VIS Date Route   Pfizer COVID-19 Vaccine 05/30/2019  1:30 PM 0.3 mL 02/20/2019 Intramuscular   Manufacturer: [place]   Lot: [id number]   [place]: [id number]

## 2019-07-31 ENCOUNTER — Telehealth: Payer: Commercial Managed Care - PPO | Admitting: Nurse Practitioner

## 2019-07-31 DIAGNOSIS — J0101 Acute recurrent maxillary sinusitis: Secondary | ICD-10-CM

## 2019-07-31 MED ORDER — AMOXICILLIN-POT CLAVULANATE 875-125 MG PO TABS: 1.0000 | ORAL_TABLET | Freq: Two times a day (BID) | ORAL | 0 refills | Status: DC

## 2019-07-31 NOTE — Progress Notes (Signed)

## 2019-08-19 ENCOUNTER — Encounter: Payer: Self-pay | Admitting: Family Medicine

## 2019-08-19 ENCOUNTER — Other Ambulatory Visit: Payer: Self-pay

## 2019-08-19 ENCOUNTER — Ambulatory Visit (INDEPENDENT_AMBULATORY_CARE_PROVIDER_SITE_OTHER): Payer: Commercial Managed Care - PPO | Admitting: Family Medicine

## 2019-08-19 VITALS — BP 110/80 | HR 76 | Temp 97.9°F | Resp 16 | Ht 64.0 in | Wt 147.5 lb

## 2019-08-19 DIAGNOSIS — E559 Vitamin D deficiency, unspecified: Secondary | ICD-10-CM | POA: Diagnosis not present

## 2019-08-19 DIAGNOSIS — Z0000 Encounter for general adult medical examination without abnormal findings: Secondary | ICD-10-CM

## 2019-08-19 DIAGNOSIS — E663 Overweight: Secondary | ICD-10-CM | POA: Diagnosis not present

## 2019-08-19 LAB — CBC WITH DIFFERENTIAL/PLATELET
Basophils Absolute: 0 10*3/uL (ref 0.0–0.1)
Basophils Relative: 0.7 % (ref 0.0–3.0)
Eosinophils Absolute: 0.1 10*3/uL (ref 0.0–0.7)
Eosinophils Relative: 1.9 % (ref 0.0–5.0)
HCT: 41.1 % (ref 36.0–46.0)
Hemoglobin: 14 g/dL (ref 12.0–15.0)
Lymphocytes Relative: 37.8 % (ref 12.0–46.0)
Lymphs Abs: 2.2 10*3/uL (ref 0.7–4.0)
MCHC: 34 g/dL (ref 30.0–36.0)
MCV: 90.7 fl (ref 78.0–100.0)
Monocytes Absolute: 0.5 10*3/uL (ref 0.1–1.0)
Monocytes Relative: 7.7 % (ref 3.0–12.0)
Neutro Abs: 3 10*3/uL (ref 1.4–7.7)
Neutrophils Relative %: 51.9 % (ref 43.0–77.0)
Platelets: 352 10*3/uL (ref 150.0–400.0)
RBC: 4.53 Mil/uL (ref 3.87–5.11)
RDW: 12.8 % (ref 11.5–15.5)
WBC: 5.8 10*3/uL (ref 4.0–10.5)

## 2019-08-19 LAB — LIPID PANEL
Cholesterol: 187 mg/dL (ref 0–200)
HDL: 45.3 mg/dL (ref >39.00)
LDL Cholesterol: 126 mg/dL — ABNORMAL HIGH (ref 0–99)
NonHDL: 141.82
Total CHOL/HDL Ratio: 4
Triglycerides: 79 mg/dL (ref 0.0–149.0)
VLDL: 15.8 mg/dL (ref 0.0–40.0)

## 2019-08-19 LAB — BASIC METABOLIC PANEL
BUN: 10 mg/dL (ref 6–23)
CO2: 26 mEq/L (ref 19–32)
Calcium: 9.7 mg/dL (ref 8.4–10.5)
Chloride: 106 mEq/L (ref 96–112)
Creatinine, Ser: 0.51 mg/dL (ref 0.40–1.20)
GFR: 139.46 mL/min (ref >60.00)
Glucose, Bld: 80 mg/dL (ref 70–99)
Potassium: 4.2 mEq/L (ref 3.5–5.1)
Sodium: 140 mEq/L (ref 135–145)

## 2019-08-19 LAB — HEPATIC FUNCTION PANEL
ALT: 19 U/L (ref 0–35)
AST: 15 U/L (ref 0–37)
Albumin: 4.9 g/dL (ref 3.5–5.2)
Alkaline Phosphatase: 67 U/L (ref 39–117)
Bilirubin, Direct: 0.1 mg/dL (ref 0.0–0.3)
Total Bilirubin: 0.7 mg/dL (ref 0.2–1.2)
Total Protein: 6.5 g/dL (ref 6.0–8.3)

## 2019-08-19 LAB — TSH: TSH: 1.87 u[IU]/mL (ref 0.35–4.50)

## 2019-08-19 LAB — VITAMIN D 25 HYDROXY (VIT D DEFICIENCY, FRACTURES): VITD: 31.86 ng/mL (ref 30.00–100.00)

## 2019-08-19 NOTE — Assessment & Plan Note (Signed)
Pt's PE WNL.  UTD on GYN and immunizations.  Check labs.  Anticipatory guidance provided.

## 2019-08-19 NOTE — Assessment & Plan Note (Signed)
Pt has hx of this.  Check labs and replete prn. 

## 2019-08-19 NOTE — Patient Instructions (Signed)
Follow up in 1 year or as needed We'll notify you of your lab results and make any changes if needed Continue to work on healthy diet and regular exercise- you're doing great! Call with any questions or concerns Have a great vacation!!!!

## 2019-08-19 NOTE — Progress Notes (Signed)
   Subjective:    Patient ID: Caitlin Duarte, female    DOB: 12/03/1985, 34 y.o.   MRN: [id number]  HPI CPE- UTD on pap, Tdap, flu, COVID vaccines.   Review of Systems Patient reports no vision/ hearing changes, adenopathy,fever, weight change,  persistant/recurrent hoarseness , swallowing issues, chest pain, palpitations, edema, persistant/recurrent cough, hemoptysis, dyspnea (rest/exertional/paroxysmal nocturnal), gastrointestinal bleeding (melena, rectal bleeding), abdominal pain, significant heartburn, bowel changes, GU symptoms (dysuria, hematuria, incontinence), Gyn symptoms (abnormal  bleeding, pain),  syncope, focal weakness, memory loss, numbness & tingling, skin/hair/nail changes, abnormal bruising or bleeding, anxiety, or depression.   This visit occurred during the SARS-CoV-2 public health emergency.  Safety protocols were in place, including screening questions prior to the visit, additional usage of staff PPE, and extensive cleaning of exam room while observing appropriate contact time as indicated for disinfecting solutions.       Objective:   Physical Exam General Appearance:    Alert, cooperative, no distress, appears stated age  Head:    Normocephalic, without obvious abnormality, atraumatic  Eyes:    PERRL, conjunctiva/corneas clear, EOM's intact, fundi    benign, both eyes  Ears:    Normal TM's and external ear canals, both ears  Nose:   Deferred due to COVID  Throat:   Neck:   Supple, symmetrical, trachea midline, no adenopathy;    Thyroid: no enlargement/tenderness/nodules  Back:     Symmetric, no curvature, ROM normal, no CVA tenderness  Lungs:     Clear to auscultation bilaterally, respirations unlabored  Chest Wall:    No tenderness or deformity   Heart:    Regular rate and rhythm, S1 and S2 normal, no murmur, rub   or gallop  Breast Exam:    Deferred to GYN  Abdomen:     Soft, non-tender, bowel sounds active all four quadrants,    no masses, no organomegaly    Genitalia:    Deferred to GYN  Rectal:    Extremities:   Extremities normal, atraumatic, no cyanosis or edema  Pulses:   2+ and symmetric all extremities  Skin:   Skin color, texture, turgor normal, no rashes or lesions  Lymph nodes:   Cervical, supraclavicular, and axillary nodes normal  Neurologic:   CNII-XII intact, normal strength, sensation and reflexes    throughout          Assessment & Plan:

## 2019-08-20 ENCOUNTER — Other Ambulatory Visit: Payer: Self-pay | Admitting: Family Medicine

## 2019-08-20 MED ORDER — RIZATRIPTAN BENZOATE 10 MG PO TBDP: 10.0000 mg | ORAL_TABLET | ORAL | 0 refills | Status: DC | PRN

## 2019-09-04 ENCOUNTER — Emergency Department (HOSPITAL_BASED_OUTPATIENT_CLINIC_OR_DEPARTMENT_OTHER): Payer: Commercial Managed Care - PPO

## 2019-09-04 ENCOUNTER — Emergency Department (HOSPITAL_BASED_OUTPATIENT_CLINIC_OR_DEPARTMENT_OTHER)
Admission: EM | Admit: 2019-09-04 | Discharge: 2019-09-04 | Disposition: A | Discharge status: Home / Self Care | Payer: Commercial Managed Care - PPO | Attending: Emergency Medicine | Admitting: Emergency Medicine

## 2019-09-04 ENCOUNTER — Other Ambulatory Visit: Payer: Self-pay

## 2019-09-04 ENCOUNTER — Encounter (HOSPITAL_BASED_OUTPATIENT_CLINIC_OR_DEPARTMENT_OTHER): Payer: Self-pay | Admitting: Emergency Medicine

## 2019-09-04 DIAGNOSIS — Y999 Unspecified external cause status: Secondary | ICD-10-CM | POA: Insufficient documentation

## 2019-09-04 DIAGNOSIS — M545 Low back pain: Secondary | ICD-10-CM | POA: Diagnosis present

## 2019-09-04 DIAGNOSIS — J45909 Unspecified asthma, uncomplicated: Secondary | ICD-10-CM | POA: Diagnosis not present

## 2019-09-04 DIAGNOSIS — W19XXXA Unspecified fall, initial encounter: Secondary | ICD-10-CM

## 2019-09-04 DIAGNOSIS — Y939 Activity, unspecified: Secondary | ICD-10-CM | POA: Insufficient documentation

## 2019-09-04 DIAGNOSIS — Y929 Unspecified place or not applicable: Secondary | ICD-10-CM | POA: Insufficient documentation

## 2019-09-04 DIAGNOSIS — W109XXA Fall (on) (from) unspecified stairs and steps, initial encounter: Secondary | ICD-10-CM | POA: Diagnosis not present

## 2019-09-04 DIAGNOSIS — S338XXA Sprain of other parts of lumbar spine and pelvis, initial encounter: Secondary | ICD-10-CM

## 2019-09-04 DIAGNOSIS — S336XXA Sprain of sacroiliac joint, initial encounter: Secondary | ICD-10-CM | POA: Diagnosis not present

## 2019-09-04 HISTORY — DX: Other seasonal allergic rhinitis: J30.2

## 2019-09-04 LAB — PREGNANCY, URINE: Preg Test, Ur: NEGATIVE

## 2019-09-04 MED ORDER — NAPROXEN 500 MG PO TABS
500.0000 mg | ORAL_TABLET | Freq: Two times a day (BID) | ORAL | 0 refills | Status: DC
Start: 2019-09-04 — End: 2020-06-10

## 2019-09-04 MED ORDER — HYDROCODONE-ACETAMINOPHEN 5-325 MG PO TABS: 1.0000 | ORAL_TABLET | Freq: Four times a day (QID) | ORAL | 0 refills | Status: DC | PRN

## 2019-09-04 MED ORDER — HYDROCODONE-ACETAMINOPHEN 5-325 MG PO TABS: 1.0000 | ORAL_TABLET | Freq: Once | ORAL | Status: DC

## 2019-09-04 MED ORDER — KETOROLAC TROMETHAMINE 60 MG/2ML IM SOLN
60.0000 mg | Freq: Once | INTRAMUSCULAR | Status: AC
  Administered 2019-09-04: 60 mg via INTRAMUSCULAR
  Filled 2019-09-04: qty 2

## 2019-09-04 NOTE — ED Notes (Signed)
Patient transported to X-ray 

## 2019-09-04 NOTE — ED Provider Notes (Signed)
[facility] Provider Note   CSN: [id number] Arrival date & time: 09/04/19  1553     History Chief Complaint  Patient presents with  . Fall    Caitlin Duarte is a 34 y.o. female with a past medical history significant for asthma, vitamin D deficiency, and history of migraines who presents to the ED after mechanical fall.  Patient states she was on the stairs and fell directly on her sacrum causing sudden onset of severe pain.  Patient denies head injury and loss of consciousness.  She notes directly after the fall she had bilateral numbness/tingling to her lower extremities which quickly resolved.  Denies further numbness and tingling of lower extremities.  Denies saddle paresthesias, bowel/bladder incontinence, and lower extremity weakness.  She has not tried thing for pain prior to arrival.  Pain is worse when sitting and standing straight.  History obtained from patient and past medical records. No interpreter used during encounter.      Past Medical History:  Diagnosis Date  . Asthma    ?allergy induced asthma  . Migraines   . Seasonal allergies     Patient Active Problem List   Diagnosis Date Noted  . Vitamin D deficiency 08/19/2019  . Chronic migraine without aura without status migrainosus, not intractable 03/05/2018  . Physical exam 12/07/2016  . GERD (gastroesophageal reflux disease) 01/04/2014  . Allergic rhinitis 09/07/2013  . RAD (reactive airway disease) 09/07/2013  . Atypical nevus 09/07/2013  . Migraine 05/15/2012    Past Surgical History:  Procedure Laterality Date  . APPENDECTOMY  2008  . EYE SURGERY  1997  . NOSE SURGERY  2006  . RETINAL LASER PROCEDURE    . SINUS SURGERY WITH INSTATRAK    . TONSILLECTOMY    . WISDOM TOOTH EXTRACTION  2006     OB History    Gravida  2   Para  1   Term  1   Preterm      AB      Living  1     SAB      TAB      Ectopic      Multiple  0   Live Births  1            Family History  Problem Relation Age of Onset  . Anemia Sister   . Thyroid disease Sister   . Headache Sister        ?migraines  . Diabetes Paternal Grandmother   . Hypertension Paternal Grandmother   . Heart disease Paternal Grandmother   . Heart attack Paternal Grandmother   . Diabetes Paternal Grandfather   . Hypertension Paternal Grandfather   . Hypertension Father   . Diabetes Father   . Alzheimer's disease Maternal Grandfather   . Stroke Maternal Grandfather     Social History   Tobacco Use  . Smoking status: Never Smoker  . Smokeless tobacco: Never Used  Vaping Use  . Vaping Use: Never used  Substance Use Topics  . Alcohol use: Never    Comment: socially  . Drug use: No    Home Medications Prior to Admission medications   Medication Sig Start Date End Date Taking? Authorizing Provider  cetirizine (ZYRTEC) 10 MG tablet Take 10 mg by mouth daily.    [provider]  HYDROcodone-acetaminophen (NORCO/VICODIN) 5-325 MG tablet Take 1 tablet by mouth every 6 (six) hours as needed for severe pain. 09/04/19   Suzy Bouchard, PA-C  naproxen (NAPROSYN) 500 MG tablet Take 1 tablet (500 mg total) by mouth 2 (two) times daily. 09/04/19   Suzy Bouchard, PA-C  rizatriptan (MAXALT-MLT) 10 MG disintegrating tablet Take 1 tablet (10 mg total) by mouth as needed for migraine. May repeat in 2 hours if needed. (max 2/24hrs). 08/20/19 01/12/20  Lomax, Amy, NP  traMADol (ULTRAM) 50 MG tablet Take 1 tablet (50 mg total) by mouth every 6 (six) hours as needed for moderate pain. Patient not taking: Reported on 08/19/2019 12/24/17   Leamon Arnt, MD    Allergies    Other  Review of Systems   Review of Systems  Constitutional: Negative for chills and fever.  Musculoskeletal: Positive for back pain and gait problem.  Skin: Negative for wound.  Neurological: Negative for numbness and headaches.  All other systems reviewed and are negative.   Physical Exam Updated  Vital Signs BP (!) 145/93 (BP Location: Right Arm)   Pulse 98   Temp 98.4 F (36.9 C) (Oral)   Resp 16   Ht 5\' 4"  (1.626 m)   Wt 67.1 kg   SpO2 99%   BMI 25.40 kg/m   Physical Exam Vitals and nursing note reviewed.  Constitutional:      General: She is not in acute distress.    Appearance: She is not ill-appearing.  HENT:     Head: Normocephalic.  Eyes:     Pupils: Pupils are equal, round, and reactive to light.  Neck:     Comments: No cervical midline tenderness. Cardiovascular:     Rate and Rhythm: Normal rate and regular rhythm.     Pulses: Normal pulses.     Heart sounds: Normal heart sounds. No murmur heard.  No friction rub. No gallop.   Pulmonary:     Effort: Pulmonary effort is normal.     Breath sounds: Normal breath sounds.  Abdominal:     General: Abdomen is flat. There is no distension.     Palpations: Abdomen is soft.     Tenderness: There is no abdominal tenderness. There is no guarding or rebound.  Musculoskeletal:     Cervical back: Neck supple.     Comments: No thoracic or lumbar midline tenderness.  Mild tenderness in sacrum region.  5/5 strength of bilateral lower extremities.  Patient ambulating in the room without difficulty.  Distal sensation and pulses intact.  Skin:    General: Skin is warm and dry.  Neurological:     General: No focal deficit present.     Mental Status: She is alert.  Psychiatric:        Mood and Affect: Mood normal.        Behavior: Behavior normal.     ED Results / Procedures / Treatments   Labs (all labs ordered are listed, but only abnormal results are displayed) Labs Reviewed  PREGNANCY, URINE    EKG None  Radiology DG Lumbar Spine Complete  Result Date: 09/04/2019 CLINICAL DATA:  Status post fall. EXAM: LUMBAR SPINE - COMPLETE 4+ VIEW COMPARISON:  None. FINDINGS: There is no evidence of lumbar spine fracture. Alignment is normal. Intervertebral disc spaces are maintained. IMPRESSION: Negative. Electronically  Signed   By: Virgina Norfolk M.D.   On: 09/04/2019 18:10   DG Sacrum/Coccyx  Result Date: 09/04/2019 CLINICAL DATA:  Status post trauma. EXAM: SACRUM AND COCCYX - 2+ VIEW COMPARISON:  None. FINDINGS: There is no evidence of fracture or other focal bone lesions. IMPRESSION: No acute osseous abnormality. CT  correlation is recommended if acute fracture remains of clinical concern. Electronically Signed   By: Virgina Norfolk M.D.   On: 09/04/2019 18:09    Procedures Procedures (including critical care time)  Medications Ordered in ED Medications  ketorolac (TORADOL) injection 60 mg (60 mg Intramuscular Given 09/04/19 1748)    ED Course  I have reviewed the triage vital signs and the nursing notes.  Pertinent labs & imaging results that were available during my care of the patient were reviewed by me and considered in my medical decision making (see chart for details).  Clinical Course as of Sep 03 1840  Fri Sep 04, 2019  1659 Preg Test, Ur: NEGATIVE [CA]    Clinical Course User Index [CA] Suzy Bouchard, PA-C   MDM Rules/Calculators/A&P                         34 year old female presents to the ED after mechanical fall that occurred just prior to arrival.  Patient fell directly on her low back and sacral region.  Denies head injury and loss of consciousness.  She is not currently any blood thinners.  Denies saddle paresthesias, lower extremity numbness/tingling, bowel/bladder incontinence, and lower extremity weakness.  Stable vitals.  Patient in no acute distress and non-ill-appearing.  Physical exam reassuring.  Patient able to ambulate in the room without difficulty.  Mild sacral tenderness.  No cervical, thoracic, or lumbar midline tenderness.  5/5 strength of lower extremities bilaterally.  Bilateral lower extremities neurovascularly intact.  Will obtain x-rays of lumbar and sacral region to rule out bony fractures.  Toradol given for pain management here in the ED.  X-rays  personally reviewed which are negative for any bony fractures.  Shared decision making in regards to CT sacrum vs. Symptomatic treatment and patient would prefer symptomatic treatment at this time which I find to be reasonable given my suspicion of occult fracture is low.  No concern for cauda equina or central cord compression.  Will treat with hydrocodone and naproxen.  Instructed patient to save hydrocodone for severe pain and take naproxen for mild to moderate pain.  Advised patient to purchase a doughnut to use for the next week. Ice therapy discussed with patient.  Instructed patient to follow-up with PCP if symptoms do not improve within the next week. Strict ED precautions discussed with patient. Patient states understanding and agrees to plan. Patient discharged home in no acute distress and stable vitals  Final Clinical Impression(s) / ED Diagnoses Final diagnoses:  Fall, initial encounter  Sacrum sprain, initial encounter    Rx / DC Orders ED Discharge Orders         Ordered    HYDROcodone-acetaminophen (NORCO/VICODIN) 5-325 MG tablet  Every 6 hours PRN     Discontinue  Reprint     09/04/19 1837    naproxen (NAPROSYN) 500 MG tablet  2 times daily     Discontinue  Reprint     09/04/19 1837           Suzy Bouchard, PA-C 09/04/19 1844    Tegeler, Gwenyth Allegra, MD 09/05/19 0040

## 2019-09-04 NOTE — ED Notes (Signed)
Imaging after u preg results

## 2019-09-04 NOTE — ED Triage Notes (Signed)
Pt fell backward onto tail bone an hour ago.  Sts she is not able to sit down due to the pain.

## 2019-09-04 NOTE — Discharge Instructions (Addendum)
As discussed, your x-rays were negative for any broken bones.  I am sending him with 2 different pain medications.  Save hydrocodone for severe pain and take naproxen for mild to moderate pain.  You may purchase a pillow doughnut at any pharmacy which I recommend using for the next few days.  Return to the ER if you develop numbness and tingling to your lower extremities, issues using the bathroom, or weakness to your lower extremities.  Please follow-up with PCP if symptoms not improved within the next week.  Return to the ER for new or worsening symptoms.

## 2019-09-04 NOTE — ED Notes (Signed)
ED Provider at bedside. 

## 2019-09-07 ENCOUNTER — Encounter: Payer: Self-pay | Admitting: Family Medicine

## 2019-09-07 ENCOUNTER — Other Ambulatory Visit: Payer: Self-pay

## 2019-09-07 ENCOUNTER — Ambulatory Visit: Payer: Commercial Managed Care - PPO | Admitting: Family Medicine

## 2019-09-07 VITALS — BP 114/83 | HR 84 | Ht 64.0 in | Wt 145.0 lb

## 2019-09-07 DIAGNOSIS — G43709 Chronic migraine without aura, not intractable, without status migrainosus: Secondary | ICD-10-CM

## 2019-09-07 MED ORDER — RIZATRIPTAN BENZOATE 10 MG PO TBDP: 10.0000 mg | ORAL_TABLET | ORAL | 11 refills | Status: DC | PRN

## 2019-09-07 NOTE — Patient Instructions (Addendum)
We will continue rizatriptan for abortive therapy.   Stay well hydrated. Eat a healthy, well balanced diet and try to get regular exercise.  May continue follow up with refills with PCP, follow up with [place] as needed  Migraine Headache A migraine headache is a very strong throbbing pain on one side or both sides of your head. This type of headache can also cause other symptoms. It can last from 4 hours to 3 days. Talk with your doctor about what things may bring on (trigger) this condition. What are the causes? The exact cause of this condition is not known. This condition may be triggered or caused by:  Drinking alcohol.  Smoking.  Taking medicines, such as: ? Medicine used to treat chest pain (nitroglycerin). ? Birth control pills. ? Estrogen. ? Some blood pressure medicines.  Eating or drinking certain products.  Doing physical activity. Other things that may trigger a migraine headache include:  Having a menstrual period.  Pregnancy.  Hunger.  Stress.  Not getting enough sleep or getting too much sleep.  Weather changes.  Tiredness (fatigue). What increases the risk?  Being 17-20 years old.  Being female.  Having a family history of migraine headaches.  Being Caucasian.  Having depression or anxiety.  Being very overweight. What are the signs or symptoms?  A throbbing pain. This pain may: ? Happen in any area of the head, such as on one side or both sides. ? Make it hard to do daily activities. ? Get worse with physical activity. ? Get worse around bright lights or loud noises.  Other symptoms may include: ? Feeling sick to your stomach (nauseous). ? Vomiting. ? Dizziness. ? Being sensitive to bright lights, loud noises, or smells.  Before you get a migraine headache, you may get warning signs (an aura). An aura may include: ? Seeing flashing lights or having blind spots. ? Seeing bright spots, halos, or zigzag lines. ? Having tunnel vision or  blurred vision. ? Having numbness or a tingling feeling. ? Having trouble talking. ? Having weak muscles.  Some people have symptoms after a migraine headache (postdromal phase), such as: ? Tiredness. ? Trouble thinking (concentrating). How is this treated?  Taking medicines that: ? Relieve pain. ? Relieve the feeling of being sick to your stomach. ? Prevent migraine headaches.  Treatment may also include: ? Having acupuncture. ? Avoiding foods that bring on migraine headaches. ? Learning ways to control your body functions (biofeedback). ? Therapy to help you know and deal with negative thoughts (cognitive behavioral therapy). Follow these instructions at home: Medicines  Take over-the-counter and prescription medicines only as told by your doctor.  Ask your doctor if the medicine prescribed to you: ? Requires you to avoid driving or using heavy machinery. ? Can cause trouble pooping (constipation). You may need to take these steps to prevent or treat trouble pooping:  Drink enough fluid to keep your pee (urine) pale yellow.  Take over-the-counter or prescription medicines.  Eat foods that are high in fiber. These include beans, whole grains, and fresh fruits and vegetables.  Limit foods that are high in fat and sugar. These include fried or sweet foods. Lifestyle  Do not drink alcohol.  Do not use any products that contain nicotine or tobacco, such as cigarettes, e-cigarettes, and chewing tobacco. If you need help quitting, ask your doctor.  Get at least 8 hours of sleep every night.  Limit and deal with stress. General instructions  Keep a journal to find out what may bring on your migraine headaches. For example, write down: ? What you eat and drink. ? How much sleep you get. ? Any change in what you eat or drink. ? Any change in your medicines.  If you have a migraine headache: ? Avoid things that make your symptoms worse, such as bright lights. ? It  may help to lie down in a dark, quiet room. ? Do not drive or use heavy machinery. ? Ask your doctor what activities are safe for you.  Keep all follow-up visits as told by your doctor. This is important. Contact a doctor if:  You get a migraine headache that is different or worse than others you have had.  You have more than 15 headache days in one month. Get help right away if:  Your migraine headache gets very bad.  Your migraine headache lasts longer than 72 hours.  You have a fever.  You have a stiff neck.  You have trouble seeing.  Your muscles feel weak or like you cannot control them.  You start to lose your balance a lot.  You start to have trouble walking.  You pass out (faint).  You have a seizure. Summary  A migraine headache is a very strong throbbing pain on one side or both sides of your head. These headaches can also cause other symptoms.  This condition may be treated with medicines and changes to your lifestyle.  Keep a journal to find out what may bring on your migraine headaches.  Contact a doctor if you get a migraine headache that is different or worse than others you have had.  Contact your doctor if you have more than 15 headache days in a month. This information is not intended to replace advice given to you by your health care provider. Make sure you discuss any questions you have with your health care provider. Document Revised: 06/20/2018 Document Reviewed: 04/10/2018 Elsevier Patient Education  [place].

## 2019-09-07 NOTE — Progress Notes (Addendum)
PATIENT: Caitlin Duarte DOB: 03-Aug-1985  REASON FOR VISIT: follow up HISTORY FROM: patient  Chief Complaint  Patient presents with  . Follow-up    rm 5, alone, pt reports no changes with her migraines      HISTORY OF PRESENT ILLNESS: Today 09/07/19 Caitlin Duarte is a 34 y.o. female here today for follow up. She has stopped Amovig. Rizatriptan will abort migraine. She may have about 3 migraines a month. Other tension style headaches may occur 1-2 times a week. These are easily aborted with drinking more water and rest. She removed her IUD. She is not planning pregnancy but would like to hold off on taking any medications contraindicated in pregnancy if possible.   HISTORY: (copied from my note on 03/09/2019)  Caitlin Duarte a 34 y.o.femalehere as requested by Billey Chang, MDformigraines. Migraines worsening. She has a 87 month old. Migraine started in high school. She didn't have many migraines after her first child she breast fed for a year and became pregnant. She has an IUD. She started having heavy periods in August and those are when she started having worsening migraines. She had the worst headache recently. She had MRIs in the past which were normal. She started a new job. She stopped breastfeeding. She has 3 migraines a month and can each last 3 days. They can be anytime, not related to her monthly menses. She takes Maxalt. Excedrin in the past. Migraines throbbing, pain, left sided usually, +light/sound sensitivity, nausea, vomiting. Lasted 3 days and did not respond to acute treatment. She can have "floaters" before her migraines but often no aura, she can associated ear pain and hearing changes.  Reviewed notes, labs and imaging from outside physicians, which showed  CT head 1/2016showed No acute intracranial abnormalities including mass lesion or mass effect, hydrocephalus, extra-axial fluid collection, midline shift, hemorrhage, or acute infarction, large ischemic  events (personally reviewed images)  Reviewed Dr. Johney Frame notes. Patient has 2 children youngest child is 40 months and she is currently breast-feeding. Patient presents due to her intractable migraine. Started over 24 hours prior to appointment. Associated photophobia, phonophobia, nausea with vomiting, confusion with an aura with left-sided visual changes that are typical for her headaches. Prior to that her last migraine was about a month ago. Prior to that they have been rare. She is used Maxalt in the past. She is use Excedrin Migraine. She was treated with Toradol and Phenergan in the office without significant relief. She was sent home with Zofran and tramadol.   REVIEW OF SYSTEMS: Out of a complete 14 system review of symptoms, the patient complains only of the following symptoms, headaches and all other reviewed systems are negative.  ALLERGIES: Allergies  Allergen Reactions  . Other Anaphylaxis and Shortness Of Breath    curry    HOME MEDICATIONS: Outpatient Medications Prior to Visit  Medication Sig Dispense Refill  . cetirizine (ZYRTEC) 10 MG tablet Take 10 mg by mouth daily.    Marland Kitchen HYDROcodone-acetaminophen (NORCO/VICODIN) 5-325 MG tablet Take 1 tablet by mouth every 6 (six) hours as needed for severe pain. 10 tablet 0  . naproxen (NAPROSYN) 500 MG tablet Take 1 tablet (500 mg total) by mouth 2 (two) times daily. 30 tablet 0  . traMADol (ULTRAM) 50 MG tablet Take 1 tablet (50 mg total) by mouth every 6 (six) hours as needed for moderate pain. 30 tablet 0  . rizatriptan (MAXALT-MLT) 10 MG disintegrating tablet Take 1 tablet (10 mg total) by  mouth as needed for migraine. May repeat in 2 hours if needed. (max 2/24hrs). 10 tablet 0   No facility-administered medications prior to visit.    PAST MEDICAL HISTORY: Past Medical History:  Diagnosis Date  . Asthma    ?allergy induced asthma  . Migraines   . Seasonal allergies     PAST SURGICAL HISTORY: Past Surgical  History:  Procedure Laterality Date  . APPENDECTOMY  2008  . EYE SURGERY  1997  . NOSE SURGERY  2006  . RETINAL LASER PROCEDURE    . SINUS SURGERY WITH INSTATRAK    . TONSILLECTOMY    . WISDOM TOOTH EXTRACTION  2006    FAMILY HISTORY: Family History  Problem Relation Age of Onset  . Anemia Sister   . Thyroid disease Sister   . Headache Sister        ?migraines  . Diabetes Paternal Grandmother   . Hypertension Paternal Grandmother   . Heart disease Paternal Grandmother   . Heart attack Paternal Grandmother   . Diabetes Paternal Grandfather   . Hypertension Paternal Grandfather   . Hypertension Father   . Diabetes Father   . Alzheimer's disease Maternal Grandfather   . Stroke Maternal Grandfather     SOCIAL HISTORY: Social History   Socioeconomic History  . Marital status: Married    Spouse name: Not on file  . Number of children: 2  . Years of education: Not on file  . Highest education level: Bachelor's degree (e.g., BA, AB, BS)  Occupational History  . Not on file  Tobacco Use  . Smoking status: Never Smoker  . Smokeless tobacco: Never Used  Vaping Use  . Vaping Use: Never used  Substance and Sexual Activity  . Alcohol use: Never    Comment: socially  . Drug use: No  . Sexual activity: Yes    Birth control/protection: None  Other Topics Concern  . Not on file  Social History Narrative   Lives at home with her husband and 2 sons   Right handed    Caffeine: 1 cup daily   Social Determinants of Health   Financial Resource Strain:   . Difficulty of Paying Living Expenses:   Food Insecurity:   . Worried About Charity fundraiser in the Last Year:   . Arboriculturist in the Last Year:   Transportation Needs:   . Film/video editor (Medical):   Marland Kitchen Lack of Transportation (Non-Medical):   Physical Activity:   . Days of Exercise per Week:   . Minutes of Exercise per Session:   Stress:   . Feeling of Stress :   Social Connections:   . Frequency of  Communication with Friends and Family:   . Frequency of Social Gatherings with Friends and Family:   . Attends Religious Services:   . Active Member of Clubs or Organizations:   . Attends Archivist Meetings:   Marland Kitchen Marital Status:   Intimate Partner Violence:   . Fear of Current or Ex-Partner:   . Emotionally Abused:   Marland Kitchen Physically Abused:   . Sexually Abused:       PHYSICAL EXAM  Vitals:   09/07/19 0728  BP: 114/83  Pulse: 84  Weight: 145 lb (65.8 kg)  Height: 5\' 4"  (1.626 m)   Body mass index is 24.89 kg/m.  Generalized: Well developed, in no acute distress  Cardiology: normal rate and rhythm, no murmur noted Respiratory: clear to auscultation bilaterally  Neurological examination  Mentation: Alert oriented to time, place, history taking. Follows all commands speech and language fluent Cranial nerve II-XII: Pupils were equal round reactive to light. Extraocular movements were full, visual field were full on confrontational test. Facial sensation and strength were normal. Uvula tongue midline. Head turning and shoulder shrug  were normal and symmetric. Motor: The motor testing reveals 5 over 5 strength of all 4 extremities. Good symmetric motor tone is noted throughout.  Sensory: Sensory testing is intact to soft touch on all 4 extremities. No evidence of extinction is noted.  Coordination: Cerebellar testing reveals good finger-nose-finger and heel-to-shin bilaterally.  Gait and station: Gait is normal. Tandem gait is normal. Romberg is negative. No drift is seen.  Reflexes: Deep tendon reflexes are symmetric and normal bilaterally.   DIAGNOSTIC DATA (LABS, IMAGING, TESTING) - I reviewed patient records, labs, notes, testing and imaging myself where available.  No flowsheet data found.   Lab Results  Component Value Date   WBC 5.8 08/19/2019   HGB 14.0 08/19/2019   HCT 41.1 08/19/2019   MCV 90.7 08/19/2019   PLT 352.0 08/19/2019      Component Value  Date/Time   NA 140 08/19/2019 1002   NA 145 (H) 02/26/2018 1538   K 4.2 08/19/2019 1002   CL 106 08/19/2019 1002   CO2 26 08/19/2019 1002   GLUCOSE 80 08/19/2019 1002   BUN 10 08/19/2019 1002   BUN 12 02/26/2018 1538   CREATININE 0.51 08/19/2019 1002   CREATININE 0.65 06/20/2016 1557   CALCIUM 9.7 08/19/2019 1002   PROT 6.5 08/19/2019 1002   PROT 6.6 02/26/2018 1538   ALBUMIN 4.9 08/19/2019 1002   ALBUMIN 4.8 02/26/2018 1538   AST 15 08/19/2019 1002   ALT 19 08/19/2019 1002   ALKPHOS 67 08/19/2019 1002   BILITOT 0.7 08/19/2019 1002   BILITOT 0.3 02/26/2018 1538   GFRNONAA 118 02/26/2018 1538   GFRAA 136 02/26/2018 1538   Lab Results  Component Value Date   CHOL 187 08/19/2019   HDL 45.30 08/19/2019   LDLCALC 126 (H) 08/19/2019   TRIG 79.0 08/19/2019   CHOLHDL 4 08/19/2019   Lab Results  Component Value Date   HGBA1C 5.3 11/11/2018   Lab Results  Component Value Date   VITAMINB12 337 11/25/2012   Lab Results  Component Value Date   TSH 1.87 08/19/2019       ASSESSMENT AND PLAN 34 y.o. year old female  has a past medical history of Asthma, Migraines, and Seasonal allergies. here with     ICD-10-CM   1. Chronic migraine without aura without status migrainosus, not intractable  G43.709      Mercedez is doing well. She continues rizatriptan for abortive therapy. She feels that migraines are well managed and does not wish to start any new medications at this time. She will continue current plan. Adequate hydration, well balanced diet and regular exercise encouraged. She may reach out to [place] as needed for worsening symptoms, otherwise, follow up with PCP for refills. She verbalizes understanding and agreement with this plan.   No orders of the defined types were placed in this encounter.    Meds ordered this encounter  Medications  . rizatriptan (MAXALT-MLT) 10 MG disintegrating tablet    Sig: Take 1 tablet (10 mg total) by mouth as needed for migraine. May repeat  in 2 hours if needed. (max 2/24hrs).    Dispense:  10 tablet    Refill:  11    Order Specific  Question:   Supervising Provider    Answer:   Melvenia Beam [[id number]]      I spent 15 minutes with the patient. 50% of this time was spent counseling and educating patient on plan of care and medications.    Debbora Presto, FNP-Caitlin 09/07/2019, 7:59 AM [facility] [address] [place], [place] [address] [phone]  Made any corrections needed, and agree with history, physical, neuro exam,assessment and plan as stated.     Sarina Ill, MD [facility]

## 2019-10-16 ENCOUNTER — Ambulatory Visit (INDEPENDENT_AMBULATORY_CARE_PROVIDER_SITE_OTHER): Payer: Commercial Managed Care - PPO | Admitting: Ophthalmology

## 2019-10-16 ENCOUNTER — Other Ambulatory Visit: Payer: Self-pay

## 2019-10-16 ENCOUNTER — Encounter (INDEPENDENT_AMBULATORY_CARE_PROVIDER_SITE_OTHER): Payer: Self-pay | Admitting: Ophthalmology

## 2019-10-16 DIAGNOSIS — H3581 Retinal edema: Secondary | ICD-10-CM | POA: Diagnosis not present

## 2019-10-16 DIAGNOSIS — H52203 Unspecified astigmatism, bilateral: Secondary | ICD-10-CM

## 2019-10-16 DIAGNOSIS — H3322 Serous retinal detachment, left eye: Secondary | ICD-10-CM

## 2019-10-16 DIAGNOSIS — D3132 Benign neoplasm of left choroid: Secondary | ICD-10-CM

## 2019-10-16 DIAGNOSIS — H33323 Round hole, bilateral: Secondary | ICD-10-CM | POA: Diagnosis not present

## 2019-10-16 DIAGNOSIS — H35413 Lattice degeneration of retina, bilateral: Secondary | ICD-10-CM | POA: Diagnosis not present

## 2019-10-16 DIAGNOSIS — H5213 Myopia, bilateral: Secondary | ICD-10-CM

## 2019-10-16 DIAGNOSIS — Z9889 Other specified postprocedural states: Secondary | ICD-10-CM

## 2019-10-16 NOTE — Progress Notes (Signed)
[facility] Note  10/16/2019     CHIEF COMPLAINT Patient presents for Retina Evaluation   HISTORY OF PRESENT ILLNESS: Caitlin Duarte is a 34 y.o. female who presents to the clinic today for:   HPI    Retina Evaluation    In left eye.  Associated Symptoms Floaters.  I, the attending physician,  performed the HPI with the patient and updated documentation appropriately.          Comments    Patient here for Retina Evaluation. Referred by [name] for re eval mass with nevous with sub retinal fluid OS. Patient states vision OD is fine. OS not so great. Has floaters. Dr saw swelling in OS. No eye pain. Eyes seem dry, sore from being worked over yesterday.       Last edited by Bernarda Caffey, MD on 10/16/2019  8:46 AM. (History)    Patient she went to see [name] at [facility] yesterday for routine exam to get new CL, she states she was seeing new floaters OS only for about a month, she states her left eye is sore, she uses a lot of drops of dryness, she states [facility] saw new swelling in her left eye and wanted her to come back here and have it checked out   Referring physician: [name], PA-C [address] [place],  [place] [address]  HISTORICAL INFORMATION:   Selected notes from the [facility] Referred by [name] concern of inferior retinal breaks OS   CURRENT MEDICATIONS: No current outpatient medications on file. (Ophthalmic Drugs)   No current facility-administered medications for this visit. (Ophthalmic Drugs)   Current Outpatient Medications (Other)  Medication Sig  . cetirizine (ZYRTEC) 10 MG tablet Take 10 mg by mouth daily.  Marland Kitchen HYDROcodone-acetaminophen (NORCO/VICODIN) 5-325 MG tablet Take 1 tablet by mouth every 6 (six) hours as needed for severe pain.  . naproxen (NAPROSYN) 500 MG tablet Take 1 tablet (500 mg total) by mouth 2 (two) times daily.  . rizatriptan (MAXALT-MLT) 10 MG disintegrating tablet Take 1  tablet (10 mg total) by mouth as needed for migraine. May repeat in 2 hours if needed. (max 2/24hrs).  . traMADol (ULTRAM) 50 MG tablet Take 1 tablet (50 mg total) by mouth every 6 (six) hours as needed for moderate pain.   No current facility-administered medications for this visit. (Other)      REVIEW OF SYSTEMS: ROS    Positive for: Gastrointestinal, Eyes, Respiratory   Negative for: Constitutional, Neurological, Skin, Genitourinary, Musculoskeletal, HENT, Endocrine, Cardiovascular, Psychiatric, Allergic/Imm, Heme/Lymph   Last edited by Theodore Demark, COA on 10/16/2019  8:32 AM. (History)       ALLERGIES Allergies  Allergen Reactions  . Other Anaphylaxis and Shortness Of Breath    curry    PAST MEDICAL HISTORY Past Medical History:  Diagnosis Date  . Asthma    ?allergy induced asthma  . Migraines   . Seasonal allergies    Past Surgical History:  Procedure Laterality Date  . APPENDECTOMY  2008  . EYE SURGERY  1997  . NOSE SURGERY  2006  . RETINAL LASER PROCEDURE    . SINUS SURGERY WITH INSTATRAK    . TONSILLECTOMY    . WISDOM TOOTH EXTRACTION  2006    FAMILY HISTORY Family History  Problem Relation Age of Onset  . Anemia Sister   . Thyroid disease Sister   . Headache Sister        ?  migraines  . Diabetes Paternal Grandmother   . Hypertension Paternal Grandmother   . Heart disease Paternal Grandmother   . Heart attack Paternal Grandmother   . Diabetes Paternal Grandfather   . Hypertension Paternal Grandfather   . Hypertension Father   . Diabetes Father   . Alzheimer's disease Maternal Grandfather   . Stroke Maternal Grandfather     SOCIAL HISTORY Social History   Tobacco Use  . Smoking status: Never Smoker  . Smokeless tobacco: Never Used  Vaping Use  . Vaping Use: Never used  Substance Use Topics  . Alcohol use: Never    Comment: socially  . Drug use: No         OPHTHALMIC EXAM:  Base Eye Exam    Visual Acuity (Snellen - Linear)       Right Left   Dist cc 20/20 -2 20/25 -2   Dist ph cc  NI   Correction: Glasses       Tonometry (Tonopen, 8:26 AM)      Right Left   Pressure 14 14       Pupils      Dark Light Shape React APD   Right 3 2 Round Brisk None   Left 3 2 Round Brisk None       Visual Fields (Counting fingers)      Left Right    Full Full       Extraocular Movement      Right Left    Full, Ortho Full, Ortho       Neuro/Psych    Oriented x3: Yes   Mood/Affect: Normal       Dilation    Both eyes: 1.0% Mydriacyl, 2.5% Phenylephrine @ 8:26 AM        Slit Lamp and Fundus Exam    Slit Lamp Exam      Right Left   Lids/Lashes Normal Normal   Conjunctiva/Sclera White and quiet White and quiet   Cornea Clear Clear   Anterior Chamber Deep and quiet Deep and quiet   Iris Round and dilated Round and dilated   Lens Clear Clear   Vitreous trace vitreous syneresis trace vitreous syneresis       Fundus Exam      Right Left   Disc Pink and sharp, Compact Pink and sharp   C/D Ratio 0.3 0.3   Macula Flat, Good foveal reflex Flat, Good foveal reflex; pigmented subfoveal choroidal nevus  (approx 1DD) now with focal SRF overlying   Vessels Normal Normal   Periphery Attached, Multiple patches of lattice from 1100-100, patch of lattice from 0500-0730 -- good laser surrounding all lesions; no new RT/RD or holes Attached, multiple patches of lattice from 11:00-1:30, pigmented lattice at 4:30,5:00, and 6:00 with atrophic holes - good laser changes surrounding all lesions; no new RT/RD or holes        Refraction    Wearing Rx      Sphere Cylinder Axis   Right -7.75 +2.25 012   Left -9.00 +4.00 171          IMAGING AND PROCEDURES  Imaging and Procedures for @TODAY @  OCT, Retina - OU - Both Eyes       Right Eye Quality was good. Central Foveal Thickness: 275. Progression has been stable. Findings include normal foveal contour, no IRF, no SRF.   Left Eye Quality was good. Central Foveal  Thickness: 442. Progression has worsened. Findings include no IRF, abnormal foveal contour, subretinal fluid (subfoveal choroidal  hyper-reflective lesion stable, but now with focal SRF overlying).   Notes *Images captured and stored on drive  Diagnosis / Impression:  NFP; no IRF/SRF OU OS: subfoveal choroidal hyper-reflective lesion stable, but now with focal SRF overlying  Clinical management:  See below  Abbreviations: NFP - Normal foveal profile. CME - cystoid macular edema. PED - pigment epithelial detachment. IRF - intraretinal fluid. SRF - subretinal fluid. EZ - ellipsoid zone. ERM - epiretinal membrane. ORA - outer retinal atrophy. ORT - outer retinal tubulation. SRHM - subretinal hyper-reflective material        Color Fundus Photography Optos - OU - Both Eyes       Right Eye Progression has been stable. Disc findings include normal observations. Macula : normal observations. Vessels : normal observations. Periphery : normal observations.   Left Eye Progression has been stable. Disc findings include normal observations. Macula : retinal pigment epithelium abnormalities, edema (subfoveal choroidal nevus with +edema). Vessels : normal observations. Periphery : normal observations.   Notes **Images stored on drive**  Impression: OD: normal study OS: subfoveal choroidal nevus with +edema                  ASSESSMENT/PLAN:    ICD-10-CM   1. Lattice degeneration of both retinas  H35.413   2. Retinal hole of both eyes  H33.323   3. Retinal edema  H35.81 OCT, Retina - OU - Both Eyes  4. Choroidal nevus of left eye  D31.32 Color Fundus Photography Optos - OU - Both Eyes  5. Serous retinal detachment of left eye  H33.22   6. Myopia of both eyes with astigmatism  H52.13    H52.203   7. History of strabismus surgery  Z98.890     1,2. Lattice degeneration w/ atrophic holes, both eyes  - OD: patches of lattice from 1130-12 and at 0730  - OS: patches of lattice from  11-130 and 430-6   - s/p laser retinopexy OS (07.31.20) - good laser changes surrounding all lesions  - s/p  laser retinopexy OD 08.07.20 - good laser surrounding all lesions  - monitor  3. No retinal edema on exam or OCT  4,5. Choroidal nevus OS, now with +SRF/focal serous RD  - flat, pigmented, subfoveal nevus  - ~1DD in size  - no orange pigment or drusen  - new focal SRF noted on exam today, but lesion is not significantly larger  - OCT shows subfoveal hyperreflective choroidal lesion with +SRF/edema -- serous retinal detachment  - discussed findings, prognosis  - unable to do fluorescein angiogram  - will refer to Dr. Daralene Milch at [facility] for evaluation and management of choroidal mass with +SRF  - f/u 4-6 wks, sooner prn  6. High myopia w/ astigmatism OU  - discussed association of high myopia with lattice degeneration and increased risk of RT/RD  - RD warnings given  - Patient has family history of high myopia (Mother and maternal Grandmother--Grandmother has history of RD and Fuchs Dystrophy--history of cornea transplant )  7.  Hx of strabismus surgery  - Hx of muscle surgery w/ Dr. Elliot Dally at age 17 (BCVA 64/20- OU)   Ophthalmic Meds Ordered this visit:  No orders of the defined types were placed in this encounter.      Return for 4-6 wks, Dilated Exam, OCT.  There are no Patient Instructions on file for this visit.   Explained the diagnoses, plan, and follow up with the patient and they expressed understanding.  Patient  expressed understanding of the importance of proper follow up care.   This document serves as a record of services personally performed by Gardiner Sleeper, MD, PhD. It was created on their behalf by San Jetty. Owens Shark, OA an ophthalmic technician. The creation of this record is the provider's dictation and/or activities during the visit.    Electronically signed by: San Jetty. Marguerita Merles 08.06.2021 12:17 PM   Gardiner Sleeper, M.D., Ph.D. Diseases &  Surgery of the Retina and Vitreous [facility]  I have reviewed the above documentation for accuracy and completeness, and I agree with the above. Gardiner Sleeper, M.D., Ph.D. 10/16/19 12:17 PM    Abbreviations: M myopia (nearsighted); A astigmatism; H hyperopia (farsighted); P presbyopia; Mrx spectacle prescription;  CTL contact lenses; OD right eye; OS left eye; OU both eyes  XT exotropia; ET esotropia; PEK punctate epithelial keratitis; PEE punctate epithelial erosions; DES dry eye syndrome; MGD meibomian gland dysfunction; ATs artificial tears; PFAT's preservative free artificial tears; [place] nuclear sclerotic cataract; PSC posterior subcapsular cataract; ERM epi-retinal membrane; PVD posterior vitreous detachment; RD retinal detachment; DM diabetes mellitus; DR diabetic retinopathy; NPDR non-proliferative diabetic retinopathy; PDR proliferative diabetic retinopathy; CSME clinically significant macular edema; DME diabetic macular edema; dbh dot blot hemorrhages; CWS cotton wool spot; POAG primary open angle glaucoma; C/D cup-to-disc ratio; HVF humphrey visual field; GVF goldmann visual field; OCT optical coherence tomography; IOP intraocular pressure; BRVO Branch retinal vein occlusion; CRVO central retinal vein occlusion; CRAO central retinal artery occlusion; BRAO branch retinal artery occlusion; RT retinal tear; SB scleral buckle; PPV pars plana vitrectomy; VH Vitreous hemorrhage; PRP panretinal laser photocoagulation; IVK intravitreal kenalog; VMT vitreomacular traction; MH Macular hole;  NVD neovascularization of the disc; NVE neovascularization elsewhere; AREDS age related eye disease study; ARMD age related macular degeneration; POAG primary open angle glaucoma; EBMD epithelial/anterior basement membrane dystrophy; ACIOL anterior chamber intraocular lens; IOL intraocular lens; PCIOL posterior chamber intraocular lens; Phaco/IOL phacoemulsification with intraocular lens  placement; [place] photorefractive keratectomy; LASIK laser assisted in situ keratomileusis; HTN hypertension; DM diabetes mellitus; COPD chronic obstructive pulmonary disease

## 2019-10-20 DIAGNOSIS — H3581 Retinal edema: Secondary | ICD-10-CM | POA: Insufficient documentation

## 2019-10-20 DIAGNOSIS — H3589 Other specified retinal disorders: Secondary | ICD-10-CM | POA: Insufficient documentation

## 2019-11-23 NOTE — Progress Notes (Signed)
[facility] Note  11/27/2019     CHIEF COMPLAINT Patient presents for Retina Follow Up   HISTORY OF PRESENT ILLNESS: Caitlin Duarte is a 34 y.o. female who presents to the clinic today for:   HPI    Retina Follow Up    Patient presents with  Other.  In left eye.  This started months ago.  Severity is moderate.  Duration of 6 weeks.  Since onset it is stable.  I, the attending physician,  performed the HPI with the patient and updated documentation appropriately.          Comments    34 y/o female pt here for 6 wk f/u for choroidal nevus OS.  Saw Dr. Daralene Milch at [facility] recently who recommended regular IVA OS.  No change in [place] OU.  Denies pain, FOL.  Has a small floater OS.  No gtts.       Last edited by Bernarda Caffey, MD on 11/28/2019 11:43 AM. (History)    Patient has had 2 IVA OS with Dr. Daralene Milch on 08.12 and 09.15, pt states she has not seen Dr. Daralene Milch since her first exam at [facility], she states Dr. Clinton Sawyer is the one actually injecting her, pt states she was not referred to her PCP for a systemic work up bc Dr. Daralene Milch does not think the mole in her eye is melanoma, she states Dr. Lilli Light has told her she will prob need more than 3 IVA and she will be going to [facility] every 4 months, her next appt is 10.17.21 for her next injections and ultrasound   Referring physician: [name], PA-C [address] [place],  [place] [address]  HISTORICAL INFORMATION:   Selected notes from the MEDICAL RECORD NUMBER Referred by [name] concern of inferior retinal breaks OS   CURRENT MEDICATIONS: No current outpatient medications on file. (Ophthalmic Drugs)   No current facility-administered medications for this visit. (Ophthalmic Drugs)   Current Outpatient Medications (Other)  Medication Sig  . cetirizine (ZYRTEC) 10 MG tablet Take 10 mg by mouth daily.  . fluticasone (FLONASE) 50 MCG/ACT nasal spray fluticasone propionate 50 mcg/actuation  nasal spray,suspension  SHAKE LQ AND U 2 SPRAYS IEN D  . HYDROcodone-acetaminophen (NORCO/VICODIN) 5-325 MG tablet Take 1 tablet by mouth every 6 (six) hours as needed for severe pain.  . naproxen (NAPROSYN) 500 MG tablet Take 1 tablet (500 mg total) by mouth 2 (two) times daily.  . rizatriptan (MAXALT-MLT) 10 MG disintegrating tablet Take 1 tablet (10 mg total) by mouth as needed for migraine. May repeat in 2 hours if needed. (max 2/24hrs).  . traMADol (ULTRAM) 50 MG tablet Take 1 tablet (50 mg total) by mouth every 6 (six) hours as needed for moderate pain.   No current facility-administered medications for this visit. (Other)      REVIEW OF SYSTEMS: ROS    Positive for: Gastrointestinal, Neurological, Eyes, Respiratory   Negative for: Constitutional, Skin, Genitourinary, Musculoskeletal, HENT, Cardiovascular, Psychiatric, Allergic/Imm, Heme/Lymph   Last edited by Matthew Folks, COA on 11/27/2019  3:29 PM. (History)       ALLERGIES Allergies  Allergen Reactions  . Other Anaphylaxis and Shortness Of Breath    curry    PAST MEDICAL HISTORY Past Medical History:  Diagnosis Date  . Asthma    ?allergy induced asthma  . Migraines   . Seasonal allergies    Past Surgical History:  Procedure Laterality Date  .  APPENDECTOMY  2008  . EYE SURGERY  1997  . NOSE SURGERY  2006  . RETINAL LASER PROCEDURE    . SINUS SURGERY WITH INSTATRAK    . TONSILLECTOMY    . WISDOM TOOTH EXTRACTION  2006    FAMILY HISTORY Family History  Problem Relation Age of Onset  . Anemia Sister   . Thyroid disease Sister   . Headache Sister        ?migraines  . Diabetes Paternal Grandmother   . Hypertension Paternal Grandmother   . Heart disease Paternal Grandmother   . Heart attack Paternal Grandmother   . Diabetes Paternal Grandfather   . Hypertension Paternal Grandfather   . Hypertension Father   . Diabetes Father   . Alzheimer's disease Maternal Grandfather   . Stroke Maternal  Grandfather     SOCIAL HISTORY Social History   Tobacco Use  . Smoking status: Never Smoker  . Smokeless tobacco: Never Used  Vaping Use  . Vaping Use: Never used  Substance Use Topics  . Alcohol use: Never    Comment: socially  . Drug use: No         OPHTHALMIC EXAM:  Base Eye Exam    Visual Acuity (Snellen - Linear)      Right Left   Dist cc 20/20 -2 20/30 -2   Dist ph cc  20/25 -2   Correction: Glasses       Tonometry (Tonopen, 3:33 PM)      Right Left   Pressure 10 11       Pupils      Dark Light Shape React APD   Right 3 2 Round Brisk None   Left 3 2 Round Brisk None       Visual Fields (Counting fingers)      Left Right    Full Full       Extraocular Movement      Right Left    Full, Ortho Full, Ortho       Neuro/Psych    Oriented x3: Yes   Mood/Affect: Normal       Dilation    Both eyes: 1.0% Mydriacyl, 2.5% Phenylephrine @ 3:33 PM        Slit Lamp and Fundus Exam    Slit Lamp Exam      Right Left   Lids/Lashes Normal Normal   Conjunctiva/Sclera White and quiet White and quiet   Cornea Clear Clear   Anterior Chamber Deep and quiet Deep and quiet   Iris Round and dilated Round and dilated   Lens Clear Clear   Vitreous trace vitreous syneresis trace vitreous syneresis       Fundus Exam      Right Left   Disc Pink and sharp, Compact Pink and sharp   C/D Ratio 0.3 0.3   Macula Flat, Good foveal reflex Flat, Good foveal reflex; pigmented subfoveal choroidal nevus  (approx 1DD) with interval improvement in focal SRF overlying   Vessels Normal Normal   Periphery Attached, Multiple patches of lattice from 1100-100, patch of lattice from 0500-0730 -- good laser surrounding all lesions; no new RT/RD or holes Attached, multiple patches of lattice from 11:00-1:30, pigmented lattice at 4:30,5:00, and 6:00 with atrophic holes - good laser changes surrounding all lesions; no new RT/RD or holes          IMAGING AND PROCEDURES  Imaging and  Procedures for @TODAY @  OCT, Retina - OU - Both Eyes  Right Eye Quality was good. Central Foveal Thickness: 236. Progression has been stable. Findings include normal foveal contour, no IRF, no SRF.   Left Eye Quality was good. Central Foveal Thickness: 352. Progression has improved. Findings include no IRF, subretinal fluid, normal foveal contour (subfoveal choroidal hyper-reflective lesion with elevation -- stable, interval improvement in SRF overlying lesion).   Notes *Images captured and stored on drive  Diagnosis / Impression:  NFP; no IRF/SRF OU OS: subfoveal choroidal hyper-reflective lesion with elevation -- stable, interval improvement in SRF overlying lesion  Clinical management:  See below  Abbreviations: NFP - Normal foveal profile. CME - cystoid macular edema. PED - pigment epithelial detachment. IRF - intraretinal fluid. SRF - subretinal fluid. EZ - ellipsoid zone. ERM - epiretinal membrane. ORA - outer retinal atrophy. ORT - outer retinal tubulation. SRHM - subretinal hyper-reflective material                 ASSESSMENT/PLAN:    ICD-10-CM   1. Lattice degeneration of both retinas  H35.413   2. Retinal hole of both eyes  H33.323   3. Retinal edema  H35.81 OCT, Retina - OU - Both Eyes  4. Choroidal nevus of left eye  D31.32   5. Serous retinal detachment of left eye  H33.22   6. Myopia of both eyes with astigmatism  H52.13    H52.203   7. History of strabismus surgery  Z98.890     1,2. Lattice degeneration w/ atrophic holes, both eyes  - OD: patches of lattice from 1130-12 and at 0730  - OS: patches of lattice from 11-130 and 430-6   - s/p laser retinopexy OS (07.31.20) - good laser changes surrounding all lesions  - s/p  laser retinopexy OD 08.07.20 - good laser surrounding all lesions  - no new RT/RD or lattice  - monitor  3. No retinal edema on exam or OCT  4,5. Choroidal nevus OS, now with +SRF/focal serous RD  - relatively flat,  pigmented, subfoveal nevus  - ~1DD in size  - no orange pigment or drusen  - new focal SRF noted on exam 8.6.21 overlying nevus  - referred to Dr. Daralene Milch, who has treated w/ IVA x2 (8.12.21, 9.15.21)  - OCT shows subfoveal hyperreflective choroidal lesion with interval improvement in SRF/edema  - f/u 8 weeks, sooner prn  6. High myopia w/ astigmatism OU  - discussed association of high myopia with lattice degeneration and increased risk of RT/RD  - RD warnings given  - Patient has family history of high myopia (Mother and maternal Grandmother--Grandmother has history of RD and Fuchs Dystrophy--history of cornea transplant )  7.  Hx of strabismus surgery  - Hx of muscle surgery w/ Dr. Elliot Dally at age 54 (BCVA 47/20- OU)   Ophthalmic Meds Ordered this visit:  No orders of the defined types were placed in this encounter.      Return in about 8 weeks (around 01/22/2020) for f/u nevus OS, DFE, OCT.  There are no Patient Instructions on file for this visit.   Explained the diagnoses, plan, and follow up with the patient and they expressed understanding.  Patient expressed understanding of the importance of proper follow up care.   This document serves as a record of services personally performed by Gardiner Sleeper, MD, PhD. It was created on their behalf by San Jetty. Owens Shark, OA an ophthalmic technician. The creation of this record is the provider's dictation and/or activities during the visit.  Electronically signed by: San Jetty. Owens Shark, [place] 09.13.2021 11:54 AM   Gardiner Sleeper, M.D., Ph.D. Diseases & Surgery of the Retina and Vitreous [facility]  I have reviewed the above documentation for accuracy and completeness, and I agree with the above. Gardiner Sleeper, M.D., Ph.D. 11/28/19 11:54 AM     Abbreviations: M myopia (nearsighted); A astigmatism; H hyperopia (farsighted); P presbyopia; Mrx spectacle prescription;  CTL contact lenses; OD right eye; OS  left eye; OU both eyes  XT exotropia; ET esotropia; PEK punctate epithelial keratitis; PEE punctate epithelial erosions; DES dry eye syndrome; MGD meibomian gland dysfunction; ATs artificial tears; PFAT's preservative free artificial tears; [place] nuclear sclerotic cataract; PSC posterior subcapsular cataract; ERM epi-retinal membrane; PVD posterior vitreous detachment; RD retinal detachment; DM diabetes mellitus; DR diabetic retinopathy; NPDR non-proliferative diabetic retinopathy; PDR proliferative diabetic retinopathy; CSME clinically significant macular edema; DME diabetic macular edema; dbh dot blot hemorrhages; CWS cotton wool spot; POAG primary open angle glaucoma; C/D cup-to-disc ratio; HVF humphrey visual field; GVF goldmann visual field; OCT optical coherence tomography; IOP intraocular pressure; BRVO Branch retinal vein occlusion; CRVO central retinal vein occlusion; CRAO central retinal artery occlusion; BRAO branch retinal artery occlusion; RT retinal tear; SB scleral buckle; PPV pars plana vitrectomy; VH Vitreous hemorrhage; PRP panretinal laser photocoagulation; IVK intravitreal kenalog; VMT vitreomacular traction; MH Macular hole;  NVD neovascularization of the disc; NVE neovascularization elsewhere; AREDS age related eye disease study; ARMD age related macular degeneration; POAG primary open angle glaucoma; EBMD epithelial/anterior basement membrane dystrophy; ACIOL anterior chamber intraocular lens; IOL intraocular lens; PCIOL posterior chamber intraocular lens; Phaco/IOL phacoemulsification with intraocular lens placement; [place] photorefractive keratectomy; LASIK laser assisted in situ keratomileusis; HTN hypertension; DM diabetes mellitus; COPD chronic obstructive pulmonary disease

## 2019-11-27 ENCOUNTER — Encounter (INDEPENDENT_AMBULATORY_CARE_PROVIDER_SITE_OTHER): Payer: Self-pay | Admitting: Ophthalmology

## 2019-11-27 ENCOUNTER — Ambulatory Visit (INDEPENDENT_AMBULATORY_CARE_PROVIDER_SITE_OTHER): Payer: Commercial Managed Care - PPO | Admitting: Ophthalmology

## 2019-11-27 ENCOUNTER — Other Ambulatory Visit: Payer: Self-pay

## 2019-11-27 DIAGNOSIS — H33323 Round hole, bilateral: Secondary | ICD-10-CM | POA: Diagnosis not present

## 2019-11-27 DIAGNOSIS — D3132 Benign neoplasm of left choroid: Secondary | ICD-10-CM

## 2019-11-27 DIAGNOSIS — H5213 Myopia, bilateral: Secondary | ICD-10-CM

## 2019-11-27 DIAGNOSIS — H3581 Retinal edema: Secondary | ICD-10-CM | POA: Diagnosis not present

## 2019-11-27 DIAGNOSIS — H35413 Lattice degeneration of retina, bilateral: Secondary | ICD-10-CM | POA: Diagnosis not present

## 2019-11-27 DIAGNOSIS — H3322 Serous retinal detachment, left eye: Secondary | ICD-10-CM

## 2019-11-27 DIAGNOSIS — H52203 Unspecified astigmatism, bilateral: Secondary | ICD-10-CM

## 2019-11-27 DIAGNOSIS — Z9889 Other specified postprocedural states: Secondary | ICD-10-CM

## 2019-11-28 ENCOUNTER — Encounter (INDEPENDENT_AMBULATORY_CARE_PROVIDER_SITE_OTHER): Payer: Self-pay | Admitting: Ophthalmology

## 2020-01-19 NOTE — Progress Notes (Signed)
[facility] Note  01/22/2020     CHIEF COMPLAINT Patient presents for Retina Follow Up   HISTORY OF PRESENT ILLNESS: Caitlin Duarte is a 34 y.o. female who presents to the clinic today for:   HPI    Retina Follow Up    Patient presents with  Other.  In left eye.  This started 8 weeks ago.  I, the attending physician,  performed the HPI with the patient and updated documentation appropriately.          Comments    Patient here for 8 weeks retina follow up for nevus OS. Patient states vision about the same. Hard to tell. No eye pain.       Last edited by Bernarda Caffey, MD on 01/24/2020 12:49 PM. (History)    Patient saw Dr. Daralene Milch on 10.19.21 and received an injection of Avastin in her left eye, she states Dr. Rico Junker does not want to inject her every month bc she is too young, her next appt with him is 12.21.21  Referring physician: [name], PA-C [address] [place],  [place] [id number]  HISTORICAL INFORMATION:   Selected notes from the MEDICAL RECORD NUMBER Referred by [name] concern of inferior retinal breaks OS   CURRENT MEDICATIONS: No current outpatient medications on file. (Ophthalmic Drugs)   No current facility-administered medications for this visit. (Ophthalmic Drugs)   Current Outpatient Medications (Other)  Medication Sig  . cetirizine (ZYRTEC) 10 MG tablet Take 10 mg by mouth daily.  . fluticasone (FLONASE) 50 MCG/ACT nasal spray fluticasone propionate 50 mcg/actuation nasal spray,suspension  SHAKE LQ AND U 2 SPRAYS IEN D  . HYDROcodone-acetaminophen (NORCO/VICODIN) 5-325 MG tablet Take 1 tablet by mouth every 6 (six) hours as needed for severe pain.  . naproxen (NAPROSYN) 500 MG tablet Take 1 tablet (500 mg total) by mouth 2 (two) times daily.  . rizatriptan (MAXALT-MLT) 10 MG disintegrating tablet Take 1 tablet (10 mg total) by mouth as needed for migraine. May repeat in 2 hours if needed. (max 2/24hrs).   . traMADol (ULTRAM) 50 MG tablet Take 1 tablet (50 mg total) by mouth every 6 (six) hours as needed for moderate pain.   No current facility-administered medications for this visit. (Other)      REVIEW OF SYSTEMS: ROS    Positive for: Gastrointestinal, Neurological, Eyes, Respiratory   Negative for: Constitutional, Skin, Genitourinary, Musculoskeletal, HENT, Cardiovascular, Psychiatric, Allergic/Imm, Heme/Lymph   Last edited by Theodore Demark, COA on 01/22/2020  3:13 PM. (History)       ALLERGIES Allergies  Allergen Reactions  . Other Anaphylaxis and Shortness Of Breath    curry    PAST MEDICAL HISTORY Past Medical History:  Diagnosis Date  . Asthma    ?allergy induced asthma  . Migraines   . Seasonal allergies    Past Surgical History:  Procedure Laterality Date  . APPENDECTOMY  2008  . EYE SURGERY  1997  . NOSE SURGERY  2006  . RETINAL LASER PROCEDURE    . SINUS SURGERY WITH INSTATRAK    . TONSILLECTOMY    . WISDOM TOOTH EXTRACTION  2006    FAMILY HISTORY Family History  Problem Relation Age of Onset  . Anemia Sister   . Thyroid disease Sister   . Headache Sister        ?migraines  . Diabetes Paternal Grandmother   . Hypertension Paternal Grandmother   . Heart disease Paternal Grandmother   .  Heart attack Paternal Grandmother   . Diabetes Paternal Grandfather   . Hypertension Paternal Grandfather   . Hypertension Father   . Diabetes Father   . Alzheimer's disease Maternal Grandfather   . Stroke Maternal Grandfather     SOCIAL HISTORY Social History   Tobacco Use  . Smoking status: Never Smoker  . Smokeless tobacco: Never Used  Vaping Use  . Vaping Use: Never used  Substance Use Topics  . Alcohol use: Never    Comment: socially  . Drug use: No         OPHTHALMIC EXAM:  Base Eye Exam    Visual Acuity (Snellen - Linear)      Right Left   Dist cc 20/20 -2 20/25 +2   Dist ph cc  20/20 -2   Correction: Glasses       Tonometry  (Tonopen, 3:10 PM)      Right Left   Pressure 13 13       Pupils      Dark Light Shape React APD   Right 3 2 Round Brisk None   Left 3 2 Round Brisk None       Visual Fields (Counting fingers)      Left Right    Full Full       Extraocular Movement      Right Left    Full, Ortho Full, Ortho       Neuro/Psych    Oriented x3: Yes   Mood/Affect: Normal       Dilation    Both eyes: 1.0% Mydriacyl, 2.5% Phenylephrine @ 3:10 PM        Slit Lamp and Fundus Exam    Slit Lamp Exam      Right Left   Lids/Lashes Normal Normal   Conjunctiva/Sclera White and quiet White and quiet   Cornea Clear Clear   Anterior Chamber Deep and quiet Deep and quiet   Iris Round and dilated Round and dilated   Lens Clear Clear   Vitreous trace vitreous syneresis trace vitreous syneresis       Fundus Exam      Right Left   Disc Pink and sharp, Compact Pink and sharp   C/D Ratio 0.3 0.3   Macula Flat, Good foveal reflex Flat, Good foveal reflex; pigmented subfoveal choroidal nevus  (approx 1DD) with interval improvement in focal SRF overlying   Vessels Normal Normal   Periphery Attached, Multiple patches of lattice from 1100-100, patch of lattice from 0500-0730 -- good laser surrounding all lesions; no new RT/RD or holes, No RT/RD or lattice Attached, multiple patches of lattice from 11:00-1:30, pigmented lattice at 4:30,5:00, and 6:00 with atrophic holes - good laser changes surrounding all lesions; no new RT/RD or holes, No new RT/RD or lattice        Refraction    Wearing Rx      Sphere Cylinder Axis   Right -7.75 +2.25 012   Left -9.00 +4.00 171          IMAGING AND PROCEDURES  Imaging and Procedures for @TODAY @  OCT, Retina - OU - Both Eyes       Right Eye Quality was good. Central Foveal Thickness: 273. Progression has been stable. Findings include normal foveal contour, no IRF, no SRF.   Left Eye Quality was good. Central Foveal Thickness: 321. Progression has  improved. Findings include no IRF, subretinal fluid, normal foveal contour (subfoveal choroidal hyper-reflective lesion with elevation -- stable, interval improvement in [facility]  overlying lesion -- resolved).   Notes *Images captured and stored on drive  Diagnosis / Impression:  NFP; no IRF/SRF OU OS: subfoveal choroidal hyper-reflective lesion with elevation -- stable, interval improvement in SRF overlying lesion -- resolved  Clinical management:  See below  Abbreviations: NFP - Normal foveal profile. CME - cystoid macular edema. PED - pigment epithelial detachment. IRF - intraretinal fluid. SRF - subretinal fluid. EZ - ellipsoid zone. ERM - epiretinal membrane. ORA - outer retinal atrophy. ORT - outer retinal tubulation. SRHM - subretinal hyper-reflective material                 ASSESSMENT/PLAN:    ICD-10-CM   1. Lattice degeneration of both retinas  H35.413   2. Retinal hole of both eyes  H33.323   3. Retinal edema  H35.81 OCT, Retina - OU - Both Eyes  4. Choroidal nevus of left eye  D31.32   5. Serous retinal detachment of left eye  H33.22   6. Myopia of both eyes with astigmatism  H52.13    H52.203   7. History of strabismus surgery  Z98.890     1,2. Lattice degeneration w/ atrophic holes, both eyes  - OD: patches of lattice from 1130-12 and at 0730  - OS: patches of lattice from 11-130 and 430-6   - s/p laser retinopexy OS (07.31.20) - good laser changes surrounding all lesions  - s/p  laser retinopexy OD (08.07.20) - good laser surrounding all lesions  - no new RT/RD or lattice  - monitor  3. No retinal edema on exam or OCT  4,5. Choroidal nevus OS with history of +SRF/focal serous RD  - relatively flat, pigmented, subfoveal nevus  - ~1DD in size  - no orange pigment or drusen  - new focal SRF noted on exam 8.6.21 overlying nevus  - referred to Dr. Daralene Milch, who has treated w/ IVA x3 (08.12.21, 09.15.21, 10.19.21)  - OCT shows subfoveal hyperreflective choroidal  lesion with interval improvement in SRF/edema -- SRF resolved  - f/u 6 months, sooner prn -- DFE, OCT  6. High myopia w/ astigmatism OU  - discussed association of high myopia with lattice degeneration and increased risk of RT/RD  - RD warnings given  - Patient has family history of high myopia (Mother and maternal Grandmother--Grandmother has history of RD and Fuchs Dystrophy--history of cornea transplant )  7.  Hx of strabismus surgery  - Hx of muscle surgery w/ Dr. Elliot Dally at age 12 (BCVA 82/20- OU)   Ophthalmic Meds Ordered this visit:  No orders of the defined types were placed in this encounter.      Return in about 6 months (around 07/21/2020) for f/u nevus OS, DFE, OCT.  There are no Patient Instructions on file for this visit.   Explained the diagnoses, plan, and follow up with the patient and they expressed understanding.  Patient expressed understanding of the importance of proper follow up care.   This document serves as a record of services personally performed by Gardiner Sleeper, MD, PhD. It was created on their behalf by San Jetty. Owens Shark, OA an ophthalmic technician. The creation of this record is the provider's dictation and/or activities during the visit.    Electronically signed by: San Jetty. Marguerita Merles 11.09.2021 12:56 PM   Gardiner Sleeper, M.D., Ph.D. Diseases & Surgery of the Retina and Vitreous [facility]  I have reviewed the above documentation for accuracy and completeness, and I agree  with the above. Gardiner Sleeper, M.D., Ph.D. 01/24/20 12:56 PM   Abbreviations: M myopia (nearsighted); A astigmatism; H hyperopia (farsighted); P presbyopia; Mrx spectacle prescription;  CTL contact lenses; OD right eye; OS left eye; OU both eyes  XT exotropia; ET esotropia; PEK punctate epithelial keratitis; PEE punctate epithelial erosions; DES dry eye syndrome; MGD meibomian gland dysfunction; ATs artificial tears; PFAT's preservative free  artificial tears; [place] nuclear sclerotic cataract; PSC posterior subcapsular cataract; ERM epi-retinal membrane; PVD posterior vitreous detachment; RD retinal detachment; DM diabetes mellitus; DR diabetic retinopathy; NPDR non-proliferative diabetic retinopathy; PDR proliferative diabetic retinopathy; CSME clinically significant macular edema; DME diabetic macular edema; dbh dot blot hemorrhages; CWS cotton wool spot; POAG primary open angle glaucoma; C/D cup-to-disc ratio; HVF humphrey visual field; GVF goldmann visual field; OCT optical coherence tomography; IOP intraocular pressure; BRVO Branch retinal vein occlusion; CRVO central retinal vein occlusion; CRAO central retinal artery occlusion; BRAO branch retinal artery occlusion; RT retinal tear; SB scleral buckle; PPV pars plana vitrectomy; VH Vitreous hemorrhage; PRP panretinal laser photocoagulation; IVK intravitreal kenalog; VMT vitreomacular traction; MH Macular hole;  NVD neovascularization of the disc; NVE neovascularization elsewhere; AREDS age related eye disease study; ARMD age related macular degeneration; POAG primary open angle glaucoma; EBMD epithelial/anterior basement membrane dystrophy; ACIOL anterior chamber intraocular lens; IOL intraocular lens; PCIOL posterior chamber intraocular lens; Phaco/IOL phacoemulsification with intraocular lens placement; [place] photorefractive keratectomy; LASIK laser assisted in situ keratomileusis; HTN hypertension; DM diabetes mellitus; COPD chronic obstructive pulmonary disease

## 2020-01-22 ENCOUNTER — Ambulatory Visit (INDEPENDENT_AMBULATORY_CARE_PROVIDER_SITE_OTHER): Payer: Commercial Managed Care - PPO | Admitting: Ophthalmology

## 2020-01-22 ENCOUNTER — Encounter (INDEPENDENT_AMBULATORY_CARE_PROVIDER_SITE_OTHER): Payer: Self-pay | Admitting: Ophthalmology

## 2020-01-22 ENCOUNTER — Other Ambulatory Visit: Payer: Self-pay

## 2020-01-22 DIAGNOSIS — H3322 Serous retinal detachment, left eye: Secondary | ICD-10-CM

## 2020-01-22 DIAGNOSIS — H52203 Unspecified astigmatism, bilateral: Secondary | ICD-10-CM

## 2020-01-22 DIAGNOSIS — H3581 Retinal edema: Secondary | ICD-10-CM

## 2020-01-22 DIAGNOSIS — H33323 Round hole, bilateral: Secondary | ICD-10-CM | POA: Diagnosis not present

## 2020-01-22 DIAGNOSIS — H5213 Myopia, bilateral: Secondary | ICD-10-CM

## 2020-01-22 DIAGNOSIS — D3132 Benign neoplasm of left choroid: Secondary | ICD-10-CM | POA: Diagnosis not present

## 2020-01-22 DIAGNOSIS — H35413 Lattice degeneration of retina, bilateral: Secondary | ICD-10-CM

## 2020-01-22 DIAGNOSIS — Z9889 Other specified postprocedural states: Secondary | ICD-10-CM

## 2020-01-24 ENCOUNTER — Encounter (INDEPENDENT_AMBULATORY_CARE_PROVIDER_SITE_OTHER): Payer: Self-pay | Admitting: Ophthalmology

## 2020-04-05 ENCOUNTER — Telehealth: Payer: Self-pay | Admitting: Family Medicine

## 2020-04-05 NOTE — Telephone Encounter (Signed)
Pt requesting to transfer care to provider at [facility] due to proximity to home.

## 2020-04-05 NOTE — Telephone Encounter (Signed)
[place] w/ me.  I wish her the best

## 2020-04-07 NOTE — Telephone Encounter (Signed)
LM for pt to call and schedule TOC with Dr. Bryan Lemma. Ok to cancel CPE in June 2022 w/Dr. Birdie Riddle as pt desires.

## 2020-06-05 ENCOUNTER — Telehealth: Payer: Self-pay | Admitting: Pulmonary Disease

## 2020-06-05 DIAGNOSIS — J019 Acute sinusitis, unspecified: Secondary | ICD-10-CM

## 2020-06-05 MED ORDER — AMOXICILLIN-POT CLAVULANATE 875-125 MG PO TABS: 1.0000 | ORAL_TABLET | Freq: Two times a day (BID) | ORAL | 0 refills | Status: DC

## 2020-06-05 MED ORDER — PREDNISONE 10 MG PO TABS: 20.0000 mg | ORAL_TABLET | Freq: Every day | ORAL | 0 refills | Status: AC

## 2020-06-05 NOTE — Telephone Encounter (Signed)
PCCM:  Contacted for ongoing sinus congestion, cough and URI symptoms that are not responding to OTC management.  Meds ordered this encounter  Medications  . predniSONE (DELTASONE) 10 MG tablet    Sig: Take 2 tablets (20 mg total) by mouth daily with breakfast for 5 days.    Dispense:  10 tablet    Refill:  0  . amoxicillin-clavulanate (AUGMENTIN) 875-125 MG tablet    Sig: Take 1 tablet by mouth 2 (two) times daily for 7 days.    Dispense:  14 tablet    Refill:  0   Garner Nash, DO [facility] 06/05/2020 9:11 AM

## 2020-06-10 ENCOUNTER — Ambulatory Visit (INDEPENDENT_AMBULATORY_CARE_PROVIDER_SITE_OTHER): Payer: Commercial Managed Care - PPO | Admitting: Family Medicine

## 2020-06-10 ENCOUNTER — Encounter: Payer: Self-pay | Admitting: Family Medicine

## 2020-06-10 ENCOUNTER — Other Ambulatory Visit: Payer: Self-pay

## 2020-06-10 VITALS — BP 101/70 | HR 111 | Temp 98.0°F | Ht 64.0 in | Wt 135.2 lb

## 2020-06-10 DIAGNOSIS — Z0000 Encounter for general adult medical examination without abnormal findings: Secondary | ICD-10-CM | POA: Diagnosis not present

## 2020-06-10 DIAGNOSIS — E559 Vitamin D deficiency, unspecified: Secondary | ICD-10-CM

## 2020-06-10 DIAGNOSIS — G43709 Chronic migraine without aura, not intractable, without status migrainosus: Secondary | ICD-10-CM | POA: Diagnosis not present

## 2020-06-10 DIAGNOSIS — Z13220 Encounter for screening for lipoid disorders: Secondary | ICD-10-CM | POA: Diagnosis not present

## 2020-06-10 DIAGNOSIS — J301 Allergic rhinitis due to pollen: Secondary | ICD-10-CM

## 2020-06-10 MED ORDER — RIZATRIPTAN BENZOATE 10 MG PO TBDP: 10.0000 mg | ORAL_TABLET | ORAL | 5 refills | Status: DC | PRN

## 2020-06-10 MED ORDER — CETIRIZINE HCL 10 MG PO TABS: 10.0000 mg | ORAL_TABLET | Freq: Every day | ORAL | 3 refills | Status: DC

## 2020-06-10 NOTE — Progress Notes (Signed)
Caitlin Duarte is a 35 y.o. female  Chief Complaint  Patient presents with  . Transitions Of Care    Pt here for physical.  Pt would like to discuss management of eye care and problem list.    HPI: Caitlin Duarte is a 35 y.o. female who is seen today for [facility] appt, previous PCP Dr. Birdie Riddle, and annual CPE, labs. Her husband, Caitlin Duarte, is my patient and they have 2 children.  Last PAP: 08/2018 - normal, due in 08/2021 - Dr. Vanessa Kick  Diet/Exercise: rides peloton  Dental: UTD Vision: retina specialist for retinal edema, lattice generation  Med refills needed today? maxalt 10mg  PRN   Past Medical History:  Diagnosis Date  . Asthma    ?allergy induced asthma  . Migraines   . Seasonal allergies     Past Surgical History:  Procedure Laterality Date  . APPENDECTOMY  2008  . EYE SURGERY  1997  . NOSE SURGERY  2006  . RETINAL LASER PROCEDURE    . SINUS SURGERY WITH INSTATRAK    . TONSILLECTOMY  2020  . WISDOM TOOTH EXTRACTION  2006    Social History   Socioeconomic History  . Marital status: Married    Spouse name: Not on file  . Number of children: 2  . Years of education: Not on file  . Highest education level: Bachelor's degree (e.g., BA, AB, BS)  Occupational History  . Not on file  Tobacco Use  . Smoking status: Never Smoker  . Smokeless tobacco: Never Used  Vaping Use  . Vaping Use: Never used  Substance and Sexual Activity  . Alcohol use: Never    Comment: socially  . Drug use: No  . Sexual activity: Yes    Birth control/protection: None  Other Topics Concern  . Not on file  Social History Narrative   Lives at home with her husband and 2 sons   Right handed    Caffeine: 1 cup daily   Social Determinants of Health   Financial Resource Strain: Not on file  Food Insecurity: Not on file  Transportation Needs: Not on file  Physical Activity: Not on file  Stress: Not on file  Social Connections: Not on file  Intimate Partner Violence: Not on file     Family History  Problem Relation Age of Onset  . Anemia Sister   . Thyroid disease Sister   . Headache Sister        ?migraines  . Diabetes Paternal Grandmother   . Hypertension Paternal Grandmother   . Heart disease Paternal Grandmother   . Heart attack Paternal Grandmother   . Diabetes Paternal Grandfather   . Hypertension Paternal Grandfather   . Hypertension Father   . Diabetes Father   . Alzheimer's disease Maternal Grandfather   . Stroke Maternal Grandfather      Immunization History  Administered Date(s) Administered  . HPV Quadrivalent 03/08/2008, 06/10/2008, 03/01/2009  . Influenza Inj Mdck Quad Pf 12/16/2014, 12/20/2016  . Influenza Inj Mdck Quad With Preservative 12/26/2017  . Influenza,inj,Quad PF,6+ Mos 12/16/2014, 12/07/2015, 12/20/2016, 12/26/2017, 11/11/2018  . Influenza-Unspecified 12/11/2015, 12/10/2017, 12/11/2018  . PFIZER(Purple Top)SARS-COV-2 Vaccination 05/09/2019, 05/30/2019  . Tdap 03/12/2009, 10/07/2014, 12/20/2016    Outpatient Encounter Medications as of 06/10/2020  Medication Sig  . fluticasone (FLONASE) 50 MCG/ACT nasal spray fluticasone propionate 50 mcg/actuation nasal spray,suspension  SHAKE LQ AND U 2 SPRAYS IEN D  . predniSONE (DELTASONE) 10 MG tablet Take 2 tablets (20 mg total) by mouth  daily with breakfast for 5 days.  Marland Kitchen amoxicillin-clavulanate (AUGMENTIN) 875-125 MG tablet Take 1 tablet by mouth 2 (two) times daily for 7 days.  . cetirizine (ZYRTEC) 10 MG tablet Take 10 mg by mouth daily. (Patient not taking: Reported on 06/10/2020)  . HYDROcodone-acetaminophen (NORCO/VICODIN) 5-325 MG tablet Take 1 tablet by mouth every 6 (six) hours as needed for severe pain. (Patient not taking: Reported on 06/10/2020)  . naproxen (NAPROSYN) 500 MG tablet Take 1 tablet (500 mg total) by mouth 2 (two) times daily. (Patient not taking: Reported on 06/10/2020)  . rizatriptan (MAXALT-MLT) 10 MG disintegrating tablet Take 1 tablet (10 mg total) by mouth as  needed for migraine. May repeat in 2 hours if needed. (max 2/24hrs).  . traMADol (ULTRAM) 50 MG tablet Take 1 tablet (50 mg total) by mouth every 6 (six) hours as needed for moderate pain. (Patient not taking: Reported on 06/10/2020)   No facility-administered encounter medications on file as of 06/10/2020.     ROS: Gen: no fever, chills  Skin: no rash, itching ENT: no ear pain, ear drainage, nasal congestion, rhinorrhea, sinus pressure, sore throat Eyes: no blurry vision, double vision Resp: no cough, wheeze,SOB Breast: no breast tenderness, no nipple discharge, no breast masses CV: no CP, palpitations, LE edema,  GI: no heartburn, n/v/d/c, abd pain GU: no dysuria, urgency, frequency, hematuria; no vaginal itching, odor, discharge MSK: no joint pain, myalgias, back pain Neuro: no dizziness, headache, weakness, vertigo Psych: no depression, anxiety, insomnia   Allergies  Allergen Reactions  . Other Anaphylaxis and Shortness Of Breath    curry    BP 101/70 (BP Location: Left Arm, Patient Position: Sitting, Cuff Size: Normal)   Pulse (!) 111   Temp 98 F (36.7 C) (Temporal)   Ht 5\' 4"  (1.626 m)   Wt 135 lb 3.2 oz (61.3 kg)   LMP 05/13/2020   SpO2 100%   BMI 23.21 kg/m   Wt Readings from Last 3 Encounters:  06/10/20 135 lb 3.2 oz (61.3 kg)  09/07/19 145 lb (65.8 kg)  09/04/19 148 lb (67.1 kg)   Temp Readings from Last 3 Encounters:  06/10/20 98 F (36.7 C) (Temporal)  09/04/19 98.4 F (36.9 C) (Oral)  08/19/19 97.9 F (36.6 C) (Tympanic)   BP Readings from Last 3 Encounters:  06/10/20 101/70  09/07/19 114/83  09/04/19 115/87   Pulse Readings from Last 3 Encounters:  06/10/20 (!) 111  09/07/19 84  09/04/19 94     Physical Exam Constitutional:      General: She is not in acute distress.    Appearance: She is well-developed. She is not ill-appearing.  HENT:     Head: Normocephalic and atraumatic.     Right Ear: Tympanic membrane and ear canal normal.      Left Ear: Tympanic membrane and ear canal normal.     Nose: Nose normal.     Mouth/Throat:     Mouth: Mucous membranes are moist.     Pharynx: Oropharynx is clear.  Eyes:     Conjunctiva/sclera: Conjunctivae normal.  Neck:     Thyroid: No thyromegaly.  Cardiovascular:     Rate and Rhythm: Normal rate and regular rhythm.     Heart sounds: Normal heart sounds. No murmur heard.   Pulmonary:     Effort: Pulmonary effort is normal. No respiratory distress.     Breath sounds: Normal breath sounds. No wheezing or rhonchi.  Abdominal:     General: Bowel sounds are normal.  There is no distension.     Palpations: Abdomen is soft. There is no mass.     Tenderness: There is no abdominal tenderness.  Musculoskeletal:     Cervical back: Neck supple.     Right lower leg: No edema.     Left lower leg: No edema.  Lymphadenopathy:     Cervical: No cervical adenopathy.  Skin:    General: Skin is warm and dry.  Neurological:     Mental Status: She is alert and oriented to person, place, and time.     Motor: No abnormal muscle tone.     Coordination: Coordination normal.  Psychiatric:        Mood and Affect: Mood normal.        Behavior: Behavior normal.     A/P:  1. Annual physical exam - discussed importance of regular CV exercise, healthy diet, adequate sleep - UTD on dental and vision - UTD on PAP - CBC; Future - ALT; Future - AST; Future - Basic metabolic panel; Future  2. Vitamin D deficiency - VITAMIN D 25 Hydroxy (Vit-D Deficiency, Fractures); Future  3. Chronic migraine without aura without status migrainosus, not intractable - controlled Refill: - rizatriptan (MAXALT-MLT) 10 MG disintegrating tablet; Take 1 tablet (10 mg total) by mouth as needed for migraine. May repeat in 2 hours if needed. (max 2/24hrs).  Dispense: 10 tablet; Refill: 5  4. Screening for lipid disorders - Lipid panel; Future  5. Seasonal allergic rhinitis due to pollen - cont flonase Refill: -  cetirizine (ZYRTEC) 10 MG tablet; Take 1 tablet (10 mg total) by mouth daily.  Dispense: 90 tablet; Refill: 3    This visit occurred during the SARS-CoV-2 public health emergency.  Safety protocols were in place, including screening questions prior to the visit, additional usage of staff PPE, and extensive cleaning of exam room while observing appropriate contact time as indicated for disinfecting solutions.

## 2020-07-04 ENCOUNTER — Encounter: Payer: Self-pay | Admitting: Family Medicine

## 2020-07-04 NOTE — Telephone Encounter (Signed)
Patient scheduled to see Dr Gena Fray 06/2720. Dm/cma

## 2020-07-05 ENCOUNTER — Other Ambulatory Visit: Payer: Self-pay

## 2020-07-06 ENCOUNTER — Encounter: Payer: Self-pay | Admitting: Family Medicine

## 2020-07-06 ENCOUNTER — Ambulatory Visit: Payer: Commercial Managed Care - PPO | Admitting: Family Medicine

## 2020-07-06 VITALS — BP 124/80 | HR 106 | Temp 98.3°F | Ht 64.0 in | Wt 137.4 lb

## 2020-07-06 DIAGNOSIS — H6503 Acute serous otitis media, bilateral: Secondary | ICD-10-CM

## 2020-07-06 DIAGNOSIS — J301 Allergic rhinitis due to pollen: Secondary | ICD-10-CM

## 2020-07-06 MED ORDER — AMOXICILLIN-POT CLAVULANATE 875-125 MG PO TABS: 1.0000 | ORAL_TABLET | Freq: Two times a day (BID) | ORAL | 0 refills | Status: DC

## 2020-07-06 NOTE — Progress Notes (Signed)
[facility] [address] [place] [address] Dept: [phone] Dept Fax: [phone]  Office Visit  Subjective:    Patient ID: Caitlin Duarte, female    DOB: [birth year], 35 y.o..   MRN: [id number]  Chief Complaint  Patient presents with  . Acute Visit    C/o having LT ear pain that start while flying home 5 days ago. Now the RT ear is starting to hurt.   She had been using Afrin nasal spray, saline spray, mucinex, and motrin x 3 days as directed by Tele-doc that she talked to 3 days ago.     History of Present Illness:  Patient is in today with bilateral ear pain and decreased hearing. MS. Rotter notes she typically has flares of allergy symptoms around this time each year. She does use Flonase 2 spray each nostril daily. She notes her symptoms were flaring just prior to a recent trip to [place]. On her return flight, she developed a sudden left ear pain. As the pain was not resolving after returning home, she was seen for a "Teledoc" appointment and recommended to use Afrin spray, saline nasal spray, ibuprofen, and Mucinex. Her left ear is marginally improved. However, today, she has had an increase in pain in her right ear and decreased hearing.  Past Medical History: Patient Active Problem List   Diagnosis Date Noted  . Retinal edema 10/20/2019  . Vitamin D deficiency 08/19/2019  . Chronic migraine without aura without status migrainosus, not intractable 03/05/2018  . Physical exam 12/07/2016  . GERD (gastroesophageal reflux disease) 01/04/2014  . Allergic rhinitis 09/07/2013  . RAD (reactive airway disease) 09/07/2013  . Atypical nevus 09/07/2013  . Migraine 05/15/2012   Past Surgical History:  Procedure Laterality Date  . APPENDECTOMY  2008  . EYE SURGERY  1997  . NOSE SURGERY  2006  . RETINAL LASER PROCEDURE    . SINUS SURGERY WITH INSTATRAK    . TONSILLECTOMY  2020  . WISDOM TOOTH EXTRACTION  2006   Family History   Problem Relation Age of Onset  . Anemia Sister   . Thyroid disease Sister   . Headache Sister        ?migraines  . Diabetes Paternal Grandmother   . Hypertension Paternal Grandmother   . Heart disease Paternal Grandmother   . Heart attack Paternal Grandmother   . Diabetes Paternal Grandfather   . Hypertension Paternal Grandfather   . Hypertension Father   . Diabetes Father   . Alzheimer's disease Maternal Grandfather   . Stroke Maternal Grandfather    Outpatient Medications Prior to Visit  Medication Sig Dispense Refill  . cetirizine (ZYRTEC) 10 MG tablet Take 1 tablet (10 mg total) by mouth daily. 90 tablet 3  . fluticasone (FLONASE) 50 MCG/ACT nasal spray fluticasone propionate 50 mcg/actuation nasal spray,suspension  SHAKE LQ AND U 2 SPRAYS IEN D    . rizatriptan (MAXALT-MLT) 10 MG disintegrating tablet Take 1 tablet (10 mg total) by mouth as needed for migraine. May repeat in 2 hours if needed. (max 2/24hrs). 10 tablet 5   No facility-administered medications prior to visit.   Allergies  Allergen Reactions  . Other Anaphylaxis and Shortness Of Breath    curry    Objective:   Today's Vitals   07/06/20 1522  BP: 124/80  Pulse: (!) 106  Temp: 98.3 F (36.8 C)  TempSrc: Temporal  SpO2: 98%  Weight: 137 lb 6.4 oz (62.3 kg)  Height: 5\' 4"  (  1.626 m)   Body mass index is 23.58 kg/m.   General: Well developed, well nourished. No acute distress. Ears: EAC are clear bilaterally. The left TM is bulging and injected, but does have a normal light reflex. The right TM   is bulging, injected and opaque, with an apparent middle ear effusion.  Psych: Alert and oriented. Normal mood and affect.  Health Maintenance Due  Topic Date Due  . COVID-19 Vaccine (3 - Pfizer risk 4-dose series) 06/27/2019   Assessment & Plan:   1. Non-recurrent acute serous otitis media of both ears Ms. Erlandson appears to have an acute right otitis media and some residual left otitis, possibly  secondary to allergic changes. I will treat her with Augmentin for 10 days. She has an appointment to follow-up with her ENT physician Monday. If not improving, I think she should keep that appointment, as she could warrant an urgent tympanostomy tube placement.  - amoxicillin-clavulanate (AUGMENTIN) 875-125 MG tablet; Take 1 tablet by mouth 2 (two) times daily.  Dispense: 20 tablet; Refill: 0  2. Seasonal allergic rhinitis due to pollen Ms. Bin's allergies appear to be the underlying trigger for her otitis media. I recommend she increase her Flonase to 2 sprays each nostril bid for 14 days, then she can return to her baseline use.   Haydee Salter, MD

## 2020-07-20 NOTE — Progress Notes (Shared)
[facility] Note  07/22/2020     CHIEF COMPLAINT Patient presents for No chief complaint on file.   HISTORY OF PRESENT ILLNESS: Caitlin Duarte is a 35 y.o. female who presents to the clinic today for:   Patient saw Dr. Daralene Milch on 10.19.21 and received an injection of Avastin in her left eye, she states Dr. Rico Junker does not want to inject her every month bc she is too young, her next appt with him is 12.21.21  Referring physician: [name], PA-C [address] [place],  [place] [address]  HISTORICAL INFORMATION:   Selected notes from the MEDICAL RECORD NUMBER Referred by [name] concern of inferior retinal breaks OS   CURRENT MEDICATIONS: No current outpatient medications on file. (Ophthalmic Drugs)   No current facility-administered medications for this visit. (Ophthalmic Drugs)   Current Outpatient Medications (Other)  Medication Sig  . amoxicillin-clavulanate (AUGMENTIN) 875-125 MG tablet Take 1 tablet by mouth 2 (two) times daily.  . cetirizine (ZYRTEC) 10 MG tablet Take 1 tablet (10 mg total) by mouth daily.  . fluticasone (FLONASE) 50 MCG/ACT nasal spray fluticasone propionate 50 mcg/actuation nasal spray,suspension  SHAKE LQ AND U 2 SPRAYS IEN D  . rizatriptan (MAXALT-MLT) 10 MG disintegrating tablet Take 1 tablet (10 mg total) by mouth as needed for migraine. May repeat in 2 hours if needed. (max 2/24hrs).   No current facility-administered medications for this visit. (Other)      REVIEW OF SYSTEMS:    ALLERGIES Allergies  Allergen Reactions  . Other Anaphylaxis and Shortness Of Breath    curry    PAST MEDICAL HISTORY Past Medical History:  Diagnosis Date  . Asthma    ?allergy induced asthma  . Migraines   . Seasonal allergies    Past Surgical History:  Procedure Laterality Date  . APPENDECTOMY  2008  . EYE SURGERY  1997  . NOSE SURGERY  2006  . RETINAL LASER PROCEDURE    . SINUS SURGERY WITH  INSTATRAK    . TONSILLECTOMY  2020  . WISDOM TOOTH EXTRACTION  2006    FAMILY HISTORY Family History  Problem Relation Age of Onset  . Anemia Sister   . Thyroid disease Sister   . Headache Sister        ?migraines  . Diabetes Paternal Grandmother   . Hypertension Paternal Grandmother   . Heart disease Paternal Grandmother   . Heart attack Paternal Grandmother   . Diabetes Paternal Grandfather   . Hypertension Paternal Grandfather   . Hypertension Father   . Diabetes Father   . Alzheimer's disease Maternal Grandfather   . Stroke Maternal Grandfather     SOCIAL HISTORY Social History   Tobacco Use  . Smoking status: Never Smoker  . Smokeless tobacco: Never Used  Vaping Use  . Vaping Use: Never used  Substance Use Topics  . Alcohol use: Never    Comment: socially  . Drug use: No         OPHTHALMIC EXAM:  Not recorded     IMAGING AND PROCEDURES  Imaging and Procedures for @TODAY @           ASSESSMENT/PLAN:    ICD-10-CM   1. Lattice degeneration of both retinas  H35.413   2. Retinal hole of both eyes  H33.323   3. Retinal edema  H35.81   4. Choroidal nevus of left eye  D31.32   5. Serous retinal detachment of left eye  H33.22   6. Myopia of both eyes with astigmatism  H52.13    H52.203   7. History of strabismus surgery  Z98.890     1,2. Lattice degeneration w/ atrophic holes, both eyes  - OD: patches of lattice from 1130-12 and at 0730  - OS: patches of lattice from 11-130 and 430-6   - s/p laser retinopexy OS (07.31.20) - good laser changes surrounding all lesions  - s/p  laser retinopexy OD (08.07.20) - good laser surrounding all lesions  - no new RT/RD or lattice  - monitor  3. No retinal edema on exam or OCT  4,5. Choroidal nevus OS with history of +SRF/focal serous RD  - relatively flat, pigmented, subfoveal nevus  - ~1DD in size  - no orange pigment or drusen  - new focal SRF noted on exam 8.6.21 overlying nevus  - referred to Dr.  Daralene Milch, who has treated w/ IVA x3 (08.12.21, 09.15.21, 10.19.21)  - OCT shows subfoveal hyperreflective choroidal lesion with interval improvement in SRF/edema -- SRF resolved  - f/u 6 months, sooner prn -- DFE, OCT  6. High myopia w/ astigmatism OU  - discussed association of high myopia with lattice degeneration and increased risk of RT/RD  - RD warnings given  - Patient has family history of high myopia (Mother and maternal Grandmother--Grandmother has history of RD and Fuchs Dystrophy--history of cornea transplant )  7.  Hx of strabismus surgery  - Hx of muscle surgery w/ Dr. Elliot Dally at age 39 (BCVA 20/20- OU)   Ophthalmic Meds Ordered this visit:  No orders of the defined types were placed in this encounter.      No follow-ups on file.  There are no Patient Instructions on file for this visit.   Explained the diagnoses, plan, and follow up with the patient and they expressed understanding.  Patient expressed understanding of the importance of proper follow up care.   This document serves as a record of services personally performed by Gardiner Sleeper, MD, PhD. It was created on their behalf by San Jetty. Owens Shark, OA an ophthalmic technician. The creation of this record is the provider's dictation and/or activities during the visit.    Electronically signed by: San Jetty. Owens Shark, [place] 05.11.2022 9:07 AM   Gardiner Sleeper, M.D., Ph.D. Diseases & Surgery of the Retina and Vitreous [facility]    Abbreviations: M myopia (nearsighted); A astigmatism; H hyperopia (farsighted); P presbyopia; Mrx spectacle prescription;  CTL contact lenses; OD right eye; OS left eye; OU both eyes  XT exotropia; ET esotropia; PEK punctate epithelial keratitis; PEE punctate epithelial erosions; DES dry eye syndrome; MGD meibomian gland dysfunction; ATs artificial tears; PFAT's preservative free artificial tears; [place] nuclear sclerotic cataract; PSC posterior subcapsular cataract; ERM  epi-retinal membrane; PVD posterior vitreous detachment; RD retinal detachment; DM diabetes mellitus; DR diabetic retinopathy; NPDR non-proliferative diabetic retinopathy; PDR proliferative diabetic retinopathy; CSME clinically significant macular edema; DME diabetic macular edema; dbh dot blot hemorrhages; CWS cotton wool spot; POAG primary open angle glaucoma; C/D cup-to-disc ratio; HVF humphrey visual field; GVF goldmann visual field; OCT optical coherence tomography; IOP intraocular pressure; BRVO Branch retinal vein occlusion; CRVO central retinal vein occlusion; CRAO central retinal artery occlusion; BRAO branch retinal artery occlusion; RT retinal tear; SB scleral buckle; PPV pars plana vitrectomy; VH Vitreous hemorrhage; PRP panretinal laser photocoagulation; IVK intravitreal kenalog; VMT vitreomacular traction; MH Macular hole;  NVD neovascularization of the disc; NVE neovascularization elsewhere; AREDS age related  eye disease study; ARMD age related macular degeneration; POAG primary open angle glaucoma; EBMD epithelial/anterior basement membrane dystrophy; ACIOL anterior chamber intraocular lens; IOL intraocular lens; PCIOL posterior chamber intraocular lens; Phaco/IOL phacoemulsification with intraocular lens placement; [place] photorefractive keratectomy; LASIK laser assisted in situ keratomileusis; HTN hypertension; DM diabetes mellitus; COPD chronic obstructive pulmonary disease

## 2020-07-22 ENCOUNTER — Encounter (INDEPENDENT_AMBULATORY_CARE_PROVIDER_SITE_OTHER): Payer: Commercial Managed Care - PPO | Admitting: Ophthalmology

## 2020-07-22 DIAGNOSIS — D3132 Benign neoplasm of left choroid: Secondary | ICD-10-CM

## 2020-07-22 DIAGNOSIS — Z9889 Other specified postprocedural states: Secondary | ICD-10-CM

## 2020-07-22 DIAGNOSIS — H33323 Round hole, bilateral: Secondary | ICD-10-CM

## 2020-07-22 DIAGNOSIS — H3322 Serous retinal detachment, left eye: Secondary | ICD-10-CM

## 2020-07-22 DIAGNOSIS — H35413 Lattice degeneration of retina, bilateral: Secondary | ICD-10-CM

## 2020-07-22 DIAGNOSIS — H5213 Myopia, bilateral: Secondary | ICD-10-CM

## 2020-07-22 DIAGNOSIS — H3581 Retinal edema: Secondary | ICD-10-CM

## 2020-08-19 ENCOUNTER — Encounter: Payer: Commercial Managed Care - PPO | Admitting: Family Medicine

## 2020-08-26 ENCOUNTER — Ambulatory Visit (INDEPENDENT_AMBULATORY_CARE_PROVIDER_SITE_OTHER): Payer: Commercial Managed Care - PPO | Admitting: Ophthalmology

## 2020-08-26 ENCOUNTER — Encounter (INDEPENDENT_AMBULATORY_CARE_PROVIDER_SITE_OTHER): Payer: Self-pay | Admitting: Ophthalmology

## 2020-08-26 ENCOUNTER — Other Ambulatory Visit: Payer: Self-pay

## 2020-08-26 DIAGNOSIS — H3322 Serous retinal detachment, left eye: Secondary | ICD-10-CM

## 2020-08-26 DIAGNOSIS — H33323 Round hole, bilateral: Secondary | ICD-10-CM | POA: Diagnosis not present

## 2020-08-26 DIAGNOSIS — D3132 Benign neoplasm of left choroid: Secondary | ICD-10-CM | POA: Diagnosis not present

## 2020-08-26 DIAGNOSIS — H5213 Myopia, bilateral: Secondary | ICD-10-CM

## 2020-08-26 DIAGNOSIS — Z9889 Other specified postprocedural states: Secondary | ICD-10-CM

## 2020-08-26 DIAGNOSIS — H35413 Lattice degeneration of retina, bilateral: Secondary | ICD-10-CM | POA: Diagnosis not present

## 2020-08-26 DIAGNOSIS — H3581 Retinal edema: Secondary | ICD-10-CM

## 2020-08-26 DIAGNOSIS — H52203 Unspecified astigmatism, bilateral: Secondary | ICD-10-CM

## 2020-08-26 NOTE — Progress Notes (Signed)
[facility] Note  08/26/2020     CHIEF COMPLAINT Patient presents for Retina Follow Up   HISTORY OF PRESENT ILLNESS: Caitlin Duarte is a 35 y.o. female who presents to the clinic today for:   HPI     Retina Follow Up   Patient presents with  Other.  In both eyes.  This started 6 months ago.  I, the attending physician,  performed the HPI with the patient and updated documentation appropriately.        Comments   Patient here for 6 months retina follow up for nevus OS, lattice degeneration OU.  Patient states vision doing fine. Caitlin Duarte found some holes at about 1 o'clock. Has had laser before. Has new RX for glasses on order.       Last edited by Bernarda Caffey, MD on 08/26/2020 12:30 PM.    Patient saw Dr. Daralene Milch on 10.19.21 and received an injection of Avastin in her left eye, she states Dr. Rico Junker does not want to inject her every month bc she is too young, her next appt with him is 12.21.21  Referring physician: [name], PA-C [address] [place],  [place] [address]  HISTORICAL INFORMATION:   Selected notes from the MEDICAL RECORD NUMBER Referred by [name] concern of inferior retinal breaks OS   CURRENT MEDICATIONS: No current outpatient medications on file. (Ophthalmic Drugs)   No current facility-administered medications for this visit. (Ophthalmic Drugs)   Current Outpatient Medications (Other)  Medication Sig   amoxicillin-clavulanate (AUGMENTIN) 875-125 MG tablet Take 1 tablet by mouth 2 (two) times daily.   cetirizine (ZYRTEC) 10 MG tablet Take 1 tablet (10 mg total) by mouth daily.   fluticasone (FLONASE) 50 MCG/ACT nasal spray fluticasone propionate 50 mcg/actuation nasal spray,suspension  SHAKE LQ AND U 2 SPRAYS IEN D   rizatriptan (MAXALT-MLT) 10 MG disintegrating tablet Take 1 tablet (10 mg total) by mouth as needed for migraine. May repeat in 2 hours if needed. (max 2/24hrs).   No current  facility-administered medications for this visit. (Other)      REVIEW OF SYSTEMS: ROS   Positive for: Gastrointestinal, Neurological, Eyes, Respiratory Negative for: Constitutional, Skin, Genitourinary, Musculoskeletal, HENT, Endocrine, Cardiovascular, Psychiatric, Allergic/Imm, Heme/Lymph Last edited by Roselee Nova D, COT on 08/26/2020 11:39 AM.       ALLERGIES Allergies  Allergen Reactions   Other Anaphylaxis and Shortness Of Breath    curry    PAST MEDICAL HISTORY Past Medical History:  Diagnosis Date   Asthma    ?allergy induced asthma   Migraines    Seasonal allergies    Past Surgical History:  Procedure Laterality Date   APPENDECTOMY  2008   [place]   NOSE SURGERY  2006   RETINAL LASER PROCEDURE     SINUS SURGERY WITH INSTATRAK     TONSILLECTOMY  2020   WISDOM TOOTH EXTRACTION  2006    FAMILY HISTORY Family History  Problem Relation Age of Onset   Anemia Sister    Thyroid disease Sister    Headache Sister        ?migraines   Diabetes Paternal Grandmother    Hypertension Paternal Grandmother    Heart disease Paternal Grandmother    Heart attack Paternal Grandmother    Diabetes Paternal Grandfather    Hypertension Paternal Grandfather    Hypertension Father    Diabetes Father    Alzheimer's disease Maternal Grandfather    Stroke  Maternal Grandfather     SOCIAL HISTORY Social History   Tobacco Use   Smoking status: Never   Smokeless tobacco: Never  Vaping Use   Vaping Use: Never used  Substance Use Topics   Alcohol use: Never    Comment: socially   Drug use: No         OPHTHALMIC EXAM:  Base Eye Exam     Visual Acuity (Snellen - Linear)       Right Left   Dist cc 20/20 -1 20/20    Correction: Glasses         Tonometry (Tonopen, 9:48 AM)       Right Left   Pressure 12 12         Pupils       Dark Light Shape React APD   Right 3 2 Round Brisk None   Left 3 2 Round Brisk None         Visual Fields  (Counting fingers)       Left Right    Full Full         Extraocular Movement       Right Left    Full, Ortho Full, Ortho         Neuro/Psych     Oriented x3: Yes   Mood/Affect: Normal         Dilation     Both eyes: 1.0% Mydriacyl, 2.5% Phenylephrine @ 9:48 AM           Slit Lamp and Fundus Exam     Slit Lamp Exam       Right Left   Lids/Lashes Normal Normal   Conjunctiva/Sclera White and quiet White and quiet   Cornea Clear Clear   Anterior Chamber Deep and quiet Deep and quiet   Iris Round and dilated Round and dilated   Lens Clear Clear   Vitreous trace vitreous syneresis trace vitreous syneresis         Fundus Exam       Right Left   Disc Pink and sharp, Compact Pink and sharp   C/D Ratio 0.3 0.3   Macula Flat, Good foveal reflex Flat, Good foveal reflex; pigmented subfoveal choroidal nevus  (approx 1DD) with stable resolution in focal SRF overlying   Vessels Normal Normal   Periphery Attached, Multiple patches of lattice from 1100-0100, patch of lattice from 0500-0730 -- good laser surrounding all lesions; no new RT/RD or holes, No RT/RD or lattice Attached, multiple patches of lattice from 11:00-1:30, new development of lattice at 1245, pigmented lattice at 0200-0300 pigmented lattice at 4:30,5:00, and 6:00 with atrophic holes - good laser changes surrounding old lesions; no new RT/RD or holes, No new RT/RD or lattice           Refraction     Wearing Rx       Sphere Cylinder Axis   Right -7.75 +2.25 012   Left -9.00 +4.00 171            IMAGING AND PROCEDURES  Imaging and Procedures for @TODAY @  OCT, Retina - OU - Both Eyes       Right Eye Quality was good. Central Foveal Thickness: 279. Progression has been stable. Findings include normal foveal contour, no IRF, no SRF, vitreomacular adhesion .   Left Eye Quality was good. Central Foveal Thickness: 326. Progression has improved. Findings include no IRF, normal foveal  contour, no SRF, retinal drusen , vitreomacular adhesion (subfoveal choroidal hyper-reflective lesion with elevation  and overlying drusen-- stable; stable resolution of SRF overlying lesion ).   Notes *Images captured and stored on drive  Diagnosis / Impression:  NFP; no IRF/SRF OU OS: subfoveal choroidal hyper-reflective lesion with elevation and overlying drusen-- stable; stable resolution of SRF overlying lesion   Clinical management:  See below  Abbreviations: NFP - Normal foveal profile. CME - cystoid macular edema. PED - pigment epithelial detachment. IRF - intraretinal fluid. SRF - subretinal fluid. EZ - ellipsoid zone. ERM - epiretinal membrane. ORA - outer retinal atrophy. ORT - outer retinal tubulation. SRHM - subretinal hyper-reflective material      Repair Retinal Breaks, Laser - OS - Left Eye       LASER PROCEDURE NOTE  Procedure:  Barrier laser retinopexy using slit lamp laser, LEFT eye   Diagnosis:   Lattice degeneration w/ atrophic holes, LEFT eye                     Patches of new lattice: 9924; 0130-0300  Surgeon: Bernarda Caffey, MD, PhD  Anesthesia: Topical  Informed consent obtained, operative eye marked, and time out performed prior to initiation of laser.   Laser settings:  Lumenis Smart532 laser, slit lamp Lens: [place] PRP 165 Power: 230 mW Spot size: 200 microns Duration: 30 msec  # spots: 221  Placement of laser: Using a [place] PRP 165 contact lens at the slit lamp, laser was placed in three confluent rows around new patches of lattice at 1245 and from 0130 to 0300.  Complications: None.  Patient tolerated the procedure well and received written and verbal post-procedure care information/education.               ASSESSMENT/PLAN:    ICD-10-CM   1. Lattice degeneration of both retinas  H35.413 Repair Retinal Breaks, Laser - OS - Left Eye    2. Retinal hole of both eyes  H33.323 Repair Retinal Breaks, Laser - OS - Left Eye    3.  Retinal edema  H35.81 OCT, Retina - OU - Both Eyes    4. Choroidal nevus of left eye  D31.32     5. Serous retinal detachment of left eye  H33.22     6. Myopia of both eyes with astigmatism  H52.13    H52.203     7. History of strabismus surgery  Z98.890        1,2. Lattice degeneration w/ atrophic holes, both eyes  - OD: patches of lattice from 1130-12 and at 0730  - OS: patches of lattice from 11-130 and 430-6   - s/p laser retinopexy OS (07.31.20) - good laser changes surrounding all lesions, new development of lattice degeneration at 1245 and 0200-0300  - s/p laser retinopexy OD (08.07.20) - good laser surrounding all lesions  - new areas of lattice degeneration OS at 1245 and 0200-0300  - recommend laser retinopexy OS today, 06.17.22 -- pt wishes to proceed - RBA of procedure discussed, questions answered - informed consent obtained and signed - see procedure note   - use Lotemax qid OS for 7 days then stop  - f/u 3 weeks - post laser check  3. No retinal edema on exam or OCT  4,5. Choroidal nevus OS with history of +SRF/focal serous RD  - relatively flat, pigmented, subfoveal nevus  - ~1DD in size  - no orange pigment or drusen  - new focal SRF noted on exam 8.6.21 overlying nevus  - referred to Dr. Daralene Milch, who has treated w/  IVA x3 (08.12.21, 09.15.21, 10.19.21)  - OCT shows subfoveal hyperreflective choroidal lesion with stable improvement in SRF/edema -- SRF stably resolved  - f/u in 6 months DFE, OCT  6. High myopia w/ astigmatism OU  - discussed association of high myopia with lattice degeneration and increased risk of RT/RD  - RD warnings given  - Patient has family history of high myopia (Mother and maternal Grandmother--Grandmother has history of RD and Fuchs Dystrophy--history of cornea transplant )  7.  Hx of strabismus surgery  - Hx of muscle surgery at age 69 with Dr. Elliot Dally (Vision 20/20-)   Ophthalmic Meds Ordered this visit:  No orders of the  defined types were placed in this encounter.      Return 3 weeks, for laser follow-up.  There are no Patient Instructions on file for this visit.   Explained the diagnoses, plan, and follow up with the patient and they expressed understanding.  Patient expressed understanding of the importance of proper follow up care.   This document serves as a record of services personally performed by Gardiner Sleeper, MD, PhD. It was created on their behalf by Roselee Nova, COMT. The creation of this record is the provider's dictation and/or activities during the visit.  Electronically signed by: Roselee Nova, COMT 08/28/20 11:18 PM   Gardiner Sleeper, M.D., Ph.D. Diseases & Surgery of the Retina and Vitreous [facility]  I have reviewed the above documentation for accuracy and completeness, and I agree with the above. Gardiner Sleeper, M.D., Ph.D. 08/28/20 11:24 PM  Abbreviations: M myopia (nearsighted); A astigmatism; H hyperopia (farsighted); P presbyopia; Mrx spectacle prescription;  CTL contact lenses; OD right eye; OS left eye; OU both eyes  XT exotropia; ET esotropia; PEK punctate epithelial keratitis; PEE punctate epithelial erosions; DES dry eye syndrome; MGD meibomian gland dysfunction; ATs artificial tears; PFAT's preservative free artificial tears; [place] nuclear sclerotic cataract; PSC posterior subcapsular cataract; ERM epi-retinal membrane; PVD posterior vitreous detachment; RD retinal detachment; DM diabetes mellitus; DR diabetic retinopathy; NPDR non-proliferative diabetic retinopathy; PDR proliferative diabetic retinopathy; CSME clinically significant macular edema; DME diabetic macular edema; dbh dot blot hemorrhages; CWS cotton wool spot; POAG primary open angle glaucoma; C/D cup-to-disc ratio; HVF humphrey visual field; GVF goldmann visual field; OCT optical coherence tomography; IOP intraocular pressure; BRVO Branch retinal vein occlusion; CRVO central retinal vein  occlusion; CRAO central retinal artery occlusion; BRAO branch retinal artery occlusion; RT retinal tear; SB scleral buckle; PPV pars plana vitrectomy; VH Vitreous hemorrhage; PRP panretinal laser photocoagulation; IVK intravitreal kenalog; VMT vitreomacular traction; MH Macular hole;  NVD neovascularization of the disc; NVE neovascularization elsewhere; AREDS age related eye disease study; ARMD age related macular degeneration; POAG primary open angle glaucoma; EBMD epithelial/anterior basement membrane dystrophy; ACIOL anterior chamber intraocular lens; IOL intraocular lens; PCIOL posterior chamber intraocular lens; Phaco/IOL phacoemulsification with intraocular lens placement; [place] photorefractive keratectomy; LASIK laser assisted in situ keratomileusis; HTN hypertension; DM diabetes mellitus; COPD chronic obstructive pulmonary disease

## 2020-09-01 ENCOUNTER — Encounter: Payer: Self-pay | Admitting: Family Medicine

## 2020-09-16 ENCOUNTER — Encounter (INDEPENDENT_AMBULATORY_CARE_PROVIDER_SITE_OTHER): Payer: Commercial Managed Care - PPO | Admitting: Ophthalmology

## 2020-09-20 NOTE — Progress Notes (Signed)
[facility] Note  09/22/2020     CHIEF COMPLAINT Patient presents for Retina Follow Up   HISTORY OF PRESENT ILLNESS: Caitlin Duarte is a 35 y.o. female who presents to the clinic today for:   HPI     Retina Follow Up   Patient presents with  Retinal Break/Detachment.  In both eyes.  This started years ago.  Severity is mild.  Duration of 4 weeks.  Since onset it is stable.  I, the attending physician,  performed the HPI with the patient and updated documentation appropriately.        Comments   35 y/o female pt here for 4 wk f/u for lattice OU.  S/p laser retinopexy OU.  No change in [place] OU.  Denies pain, FOL, floaters.  Restasis BID OU.      Last edited by Bernarda Caffey, MD on 09/22/2020  3:26 PM.    Patient states she missed her last appt due to having covid  Referring physician: [name], PA-C [address] [place],  [place] [address]  HISTORICAL INFORMATION:   Selected notes from the MEDICAL RECORD NUMBER Referred by [name] concern of inferior retinal breaks OS   CURRENT MEDICATIONS: Current Outpatient Medications (Ophthalmic Drugs)  Medication Sig   RESTASIS 0.05 % ophthalmic emulsion 1 drop 2 (two) times daily.   No current facility-administered medications for this visit. (Ophthalmic Drugs)   Current Outpatient Medications (Other)  Medication Sig   amoxicillin-clavulanate (AUGMENTIN) 875-125 MG tablet Take 1 tablet by mouth 2 (two) times daily.   cetirizine (ZYRTEC) 10 MG tablet Take 1 tablet (10 mg total) by mouth daily.   fluticasone (FLONASE) 50 MCG/ACT nasal spray fluticasone propionate 50 mcg/actuation nasal spray,suspension  SHAKE LQ AND U 2 SPRAYS IEN D   PAXLOVID 20 x 150 MG & 10 x 100MG  TBPK Take 3 tablets by mouth 2 (two) times daily.   rizatriptan (MAXALT-MLT) 10 MG disintegrating tablet Take 1 tablet (10 mg total) by mouth as needed for migraine. May repeat in 2 hours if needed. (max 2/24hrs).   No  current facility-administered medications for this visit. (Other)      REVIEW OF SYSTEMS: ROS   Positive for: Gastrointestinal, Eyes Negative for: Constitutional, Neurological, Skin, Genitourinary, Musculoskeletal, HENT, Endocrine, Cardiovascular, Respiratory, Psychiatric, Allergic/Imm, Heme/Lymph Last edited by Matthew Folks, COA on 09/22/2020  9:47 AM.        ALLERGIES Allergies  Allergen Reactions   Other Anaphylaxis and Shortness Of Breath    curry    PAST MEDICAL HISTORY Past Medical History:  Diagnosis Date   Asthma    ?allergy induced asthma   Migraines    Seasonal allergies    Past Surgical History:  Procedure Laterality Date   APPENDECTOMY  2008   [place]   NOSE SURGERY  2006   RETINAL LASER PROCEDURE     SINUS SURGERY WITH INSTATRAK     TONSILLECTOMY  2020   WISDOM TOOTH EXTRACTION  2006    FAMILY HISTORY Family History  Problem Relation Age of Onset   Anemia Sister    Thyroid disease Sister    Headache Sister        ?migraines   Diabetes Paternal Grandmother    Hypertension Paternal Grandmother    Heart disease Paternal Grandmother    Heart attack Paternal Grandmother    Diabetes Paternal Grandfather    Hypertension Paternal Grandfather    Hypertension Father  Diabetes Father    Alzheimer's disease Maternal Grandfather    Stroke Maternal Grandfather     SOCIAL HISTORY Social History   Tobacco Use   Smoking status: Never   Smokeless tobacco: Never  Vaping Use   Vaping Use: Never used  Substance Use Topics   Alcohol use: Never    Comment: socially   Drug use: No         OPHTHALMIC EXAM:  Base Eye Exam     Visual Acuity (Snellen - Linear)       Right Left   Dist cc 20/20 - 20/20 -2    Correction: Glasses         Tonometry (Tonopen, 9:53 AM)       Right Left   Pressure 12 11         Pupils       Dark Light Shape React APD   Right 3 2 Round Brisk None   Left 3 2 Round Brisk None          Visual Fields       Left Right    Full Full         Extraocular Movement       Right Left    Full, Ortho Full, Ortho         Neuro/Psych     Oriented x3: Yes   Mood/Affect: Normal         Dilation     Both eyes: 1.0% Mydriacyl, 2.5% Phenylephrine @ 9:53 AM           Slit Lamp and Fundus Exam     Slit Lamp Exam       Right Left   Lids/Lashes Normal Normal   Conjunctiva/Sclera White and quiet White and quiet   Cornea Clear Clear   Anterior Chamber Deep and quiet Deep and quiet   Iris Round and dilated Round and dilated   Lens Clear Clear   Vitreous trace vitreous syneresis trace vitreous syneresis         Fundus Exam       Right Left   Disc Pink and sharp, Compact Pink and sharp   C/D Ratio 0.3 0.3   Macula Flat, Good foveal reflex Flat, Good foveal reflex; pigmented subfoveal choroidal nevus  (approx 1DD) with stable resolution in focal SRF overlying   Vessels Normal Normal   Periphery Attached, Multiple patches of lattice from 1100-0100, patch of lattice from 0500-0730 -- good laser surrounding all lesions; no new RT/RD or holes, No RT/RD or lattice Attached, multiple patches of lattice from 11:00-1:30, new development of lattice at 1245, pigmented lattice at 0200-0300 pigmented lattice at 4:30,5:00, and 6:00 with atrophic holes - good laser changes surrounding all lesions (old and new); no new RT/RD or lattice            IMAGING AND PROCEDURES  Imaging and Procedures for @TODAY @  OCT, Retina - OU - Both Eyes       Right Eye Quality was good. Central Foveal Thickness: 274. Progression has been stable. Findings include normal foveal contour, no IRF, no SRF, vitreomacular adhesion .   Left Eye Quality was good. Central Foveal Thickness: 325. Progression has improved. Findings include no IRF, normal foveal contour, no SRF, retinal drusen , vitreomacular adhesion (subfoveal choroidal hyper-reflective lesion with elevation and overlying drusen--  stable; stable resolution of SRF overlying lesion, interval improvement in vitreous opacities).   Notes *Images captured and stored on drive  Diagnosis / Impression:  NFP; no IRF/SRF OU OS: subfoveal choroidal hyper-reflective lesion with elevation and overlying drusen-- stable; stable resolution of SRF overlying lesion, interval improvement in vitreous opacities  Clinical management:  See below  Abbreviations: NFP - Normal foveal profile. CME - cystoid macular edema. PED - pigment epithelial detachment. IRF - intraretinal fluid. SRF - subretinal fluid. EZ - ellipsoid zone. ERM - epiretinal membrane. ORA - outer retinal atrophy. ORT - outer retinal tubulation. SRHM - subretinal hyper-reflective material                ASSESSMENT/PLAN:    ICD-10-CM   1. Lattice degeneration of both retinas  H35.413     2. Retinal hole of both eyes  H33.323     3. Retinal edema  H35.81 OCT, Retina - OU - Both Eyes    4. Choroidal nevus of left eye  D31.32     5. Serous retinal detachment of left eye  H33.22     6. Myopia of both eyes with astigmatism  H52.13    H52.203     7. History of strabismus surgery  Z98.890         1-3. Lattice degeneration w/ atrophic holes, both eyes  - OD: patches of lattice from 1130-12 and at 0730  - OS: patches of lattice from 11-130 and 430-6   - s/p laser retinopexy OS (07.31.20) - good laser changes surrounding all lesions  - s/p laser retinopexy OD (08.07.20) - good laser surrounding all lesions - new development of lattice degeneration at 1245 and 0200-0300 -- s/p laser retinopexy (06.17.22) -- good laser changes in place  - no new RT/RD or lattice  - f/u 3-4 months  4,5. Choroidal nevus OS with history of +SRF/focal serous RD  - relatively flat, pigmented, subfoveal nevus  - ~1DD in size  - no orange pigment or drusen  - new focal SRF noted on exam 8.6.21 overlying nevus  - referred to Dr. Daralene Milch, who has treated w/ IVA x3 (08.12.21,  09.15.21, 10.19.21)  - OCT shows subfoveal hyperreflective choroidal lesion with stable improvement in SRF/edema -- SRF stably resolved  6. High myopia w/ astigmatism OU  - discussed association of high myopia with lattice degeneration and increased risk of RT/RD  - RD warnings given  - Patient has family history of high myopia (Mother and maternal Grandmother--Grandmother has history of RD and Fuchs Dystrophy--history of cornea transplant )  7.  Hx of strabismus surgery  - Hx of muscle surgery at age 80 with Dr. Elliot Dally (Vision 20/20-)   Ophthalmic Meds Ordered this visit:  No orders of the defined types were placed in this encounter.      Return for f/u 3-4 months, lattice degeneration OU, DFE, OCT.  There are no Patient Instructions on file for this visit.   Explained the diagnoses, plan, and follow up with the patient and they expressed understanding.  Patient expressed understanding of the importance of proper follow up care.   This document serves as a record of services personally performed by Gardiner Sleeper, MD, PhD. It was created on their behalf by Leonie Douglas, an ophthalmic technician. The creation of this record is the provider's dictation and/or activities during the visit.    Electronically signed by: Leonie Douglas COA, 09/22/20  3:34 PM  Gardiner Sleeper, M.D., Ph.D. Diseases & Surgery of the Retina and [place] 09/22/2020  I have reviewed the above documentation for accuracy and completeness, and I agree  with the above. Gardiner Sleeper, M.D., Ph.D. 09/22/20 3:37 PM  Abbreviations: M myopia (nearsighted); A astigmatism; H hyperopia (farsighted); P presbyopia; Mrx spectacle prescription;  CTL contact lenses; OD right eye; OS left eye; OU both eyes  XT exotropia; ET esotropia; PEK punctate epithelial keratitis; PEE punctate epithelial erosions; DES dry eye syndrome; MGD meibomian gland dysfunction; ATs artificial tears; PFAT's  preservative free artificial tears; [place] nuclear sclerotic cataract; PSC posterior subcapsular cataract; ERM epi-retinal membrane; PVD posterior vitreous detachment; RD retinal detachment; DM diabetes mellitus; DR diabetic retinopathy; NPDR non-proliferative diabetic retinopathy; PDR proliferative diabetic retinopathy; CSME clinically significant macular edema; DME diabetic macular edema; dbh dot blot hemorrhages; CWS cotton wool spot; POAG primary open angle glaucoma; C/D cup-to-disc ratio; HVF humphrey visual field; GVF goldmann visual field; OCT optical coherence tomography; IOP intraocular pressure; BRVO Branch retinal vein occlusion; CRVO central retinal vein occlusion; CRAO central retinal artery occlusion; BRAO branch retinal artery occlusion; RT retinal tear; SB scleral buckle; PPV pars plana vitrectomy; VH Vitreous hemorrhage; PRP panretinal laser photocoagulation; IVK intravitreal kenalog; VMT vitreomacular traction; MH Macular hole;  NVD neovascularization of the disc; NVE neovascularization elsewhere; AREDS age related eye disease study; ARMD age related macular degeneration; POAG primary open angle glaucoma; EBMD epithelial/anterior basement membrane dystrophy; ACIOL anterior chamber intraocular lens; IOL intraocular lens; PCIOL posterior chamber intraocular lens; Phaco/IOL phacoemulsification with intraocular lens placement; [place] photorefractive keratectomy; LASIK laser assisted in situ keratomileusis; HTN hypertension; DM diabetes mellitus; COPD chronic obstructive pulmonary disease

## 2020-09-22 ENCOUNTER — Ambulatory Visit (INDEPENDENT_AMBULATORY_CARE_PROVIDER_SITE_OTHER): Payer: Commercial Managed Care - PPO | Admitting: Ophthalmology

## 2020-09-22 ENCOUNTER — Other Ambulatory Visit: Payer: Self-pay

## 2020-09-22 ENCOUNTER — Encounter (INDEPENDENT_AMBULATORY_CARE_PROVIDER_SITE_OTHER): Payer: Self-pay | Admitting: Ophthalmology

## 2020-09-22 DIAGNOSIS — D3132 Benign neoplasm of left choroid: Secondary | ICD-10-CM | POA: Diagnosis not present

## 2020-09-22 DIAGNOSIS — H35413 Lattice degeneration of retina, bilateral: Secondary | ICD-10-CM | POA: Diagnosis not present

## 2020-09-22 DIAGNOSIS — Z9889 Other specified postprocedural states: Secondary | ICD-10-CM

## 2020-09-22 DIAGNOSIS — H52203 Unspecified astigmatism, bilateral: Secondary | ICD-10-CM

## 2020-09-22 DIAGNOSIS — H33323 Round hole, bilateral: Secondary | ICD-10-CM

## 2020-09-22 DIAGNOSIS — H5213 Myopia, bilateral: Secondary | ICD-10-CM

## 2020-09-22 DIAGNOSIS — H3581 Retinal edema: Secondary | ICD-10-CM

## 2020-09-22 DIAGNOSIS — H3322 Serous retinal detachment, left eye: Secondary | ICD-10-CM | POA: Diagnosis not present

## 2020-12-23 ENCOUNTER — Encounter (INDEPENDENT_AMBULATORY_CARE_PROVIDER_SITE_OTHER): Payer: Commercial Managed Care - PPO | Admitting: Ophthalmology

## 2020-12-23 DIAGNOSIS — H3322 Serous retinal detachment, left eye: Secondary | ICD-10-CM

## 2020-12-23 DIAGNOSIS — Z9889 Other specified postprocedural states: Secondary | ICD-10-CM

## 2020-12-23 DIAGNOSIS — H33323 Round hole, bilateral: Secondary | ICD-10-CM

## 2020-12-23 DIAGNOSIS — H3581 Retinal edema: Secondary | ICD-10-CM

## 2020-12-23 DIAGNOSIS — H35413 Lattice degeneration of retina, bilateral: Secondary | ICD-10-CM

## 2020-12-23 DIAGNOSIS — H5213 Myopia, bilateral: Secondary | ICD-10-CM

## 2020-12-23 DIAGNOSIS — D3132 Benign neoplasm of left choroid: Secondary | ICD-10-CM

## 2021-02-15 ENCOUNTER — Ambulatory Visit (INDEPENDENT_AMBULATORY_CARE_PROVIDER_SITE_OTHER)
Admission: RE | Admit: 2021-02-15 | Discharge: 2021-02-15 | Disposition: A | Discharge status: Home / Self Care | Payer: Commercial Managed Care - PPO | Source: Ambulatory Visit | Attending: Nurse Practitioner | Admitting: Nurse Practitioner

## 2021-02-15 ENCOUNTER — Other Ambulatory Visit: Payer: Self-pay

## 2021-02-15 ENCOUNTER — Ambulatory Visit: Payer: Commercial Managed Care - PPO | Admitting: Nurse Practitioner

## 2021-02-15 VITALS — BP 132/88 | HR 118 | Temp 98.2°F | Resp 12 | Ht 64.0 in | Wt 143.0 lb

## 2021-02-15 DIAGNOSIS — R1030 Lower abdominal pain, unspecified: Secondary | ICD-10-CM | POA: Diagnosis not present

## 2021-02-15 DIAGNOSIS — R194 Change in bowel habit: Secondary | ICD-10-CM | POA: Insufficient documentation

## 2021-02-15 LAB — POCT URINALYSIS DIP (CLINITEK)
Bilirubin, UA: NEGATIVE
Glucose, UA: NEGATIVE mg/dL
Leukocytes, UA: NEGATIVE
Nitrite, UA: NEGATIVE
POC PROTEIN,UA: NEGATIVE
Spec Grav, UA: 1.02 (ref 1.010–1.025)
Urobilinogen, UA: 0.2 E.U./dL
pH, UA: 6 (ref 5.0–8.0)

## 2021-02-15 LAB — POCT URINE PREGNANCY: Preg Test, Ur: NEGATIVE

## 2021-02-15 MED ORDER — DICYCLOMINE HCL 10 MG PO CAPS: 10.0000 mg | ORAL_CAPSULE | Freq: Three times a day (TID) | ORAL | 0 refills | Status: DC | PRN

## 2021-02-15 NOTE — Progress Notes (Signed)
Acute Office Visit  Subjective:    Patient ID: DEMRI POULTON, female    DOB: 1985/11/01, 35 y.o.   MRN: [id number]  Chief Complaint  Patient presents with   Abdominal Pain    Had upset stomach while she was on the period about 2 weeks ago, some stomach pain with bowel movement and had hard time going. Since has noticed that she has to run to the bathroom after eating and having hard time holding bowel movements in while trying to get to the bathroom. Some mucus noticed with wiping after bowel movement. No blood since her period. Due to the stomach pain one day was so intense patient threw up.     Patient is in today for Abdominal pain  Started approx 2 weeks ago. States that it happens several times a day mostly after she eats She is having painful bowel movements and several BMs daily. Traditionally she has one BM daily but has been experiencing  up to 4 BMs daily Nothing makes it worse. Nothing makes it better minus not eating eating  Pain can wake her in the night and then has a BM. Cramping nature.  States she has noticed some mucous in her stools  Has been using mira lax because she will have sensation of needing to have a BM but cannot make it happen  Past Medical History:  Diagnosis Date   Asthma    ?allergy induced asthma   Migraines    Seasonal allergies     Past Surgical History:  Procedure Laterality Date   APPENDECTOMY  2008   [place]   NOSE SURGERY  2006   RETINAL LASER PROCEDURE     SINUS SURGERY WITH INSTATRAK     TONSILLECTOMY  2020   WISDOM TOOTH EXTRACTION  2006    Family History  Problem Relation Age of Onset   Anemia Sister    Thyroid disease Sister    Headache Sister        ?migraines   Diabetes Paternal Grandmother    Hypertension Paternal Grandmother    Heart disease Paternal Grandmother    Heart attack Paternal Grandmother    Diabetes Paternal Grandfather    Hypertension Paternal Grandfather    Hypertension Father    Diabetes  Father    Alzheimer's disease Maternal Grandfather    Stroke Maternal Grandfather     Social History   Socioeconomic History   Marital status: Married    Spouse name: Not on file   Number of children: 2   Years of education: Not on file   Highest education level: Bachelor's degree (e.g., BA, AB, BS)  Occupational History   Not on file  Tobacco Use   Smoking status: Never   Smokeless tobacco: Never  Vaping Use   Vaping Use: Never used  Substance and Sexual Activity   Alcohol use: Never    Comment: socially   Drug use: No   Sexual activity: Yes    Birth control/protection: None  Other Topics Concern   Not on file  Social History Narrative   Lives at home with her husband and 2 sons   Right handed    Caffeine: 1 cup daily   Social Determinants of Health   Financial Resource Strain: Not on file  Food Insecurity: Not on file  Transportation Needs: Not on file  Physical Activity: Not on file  Stress: Not on file  Social Connections: Not on file  Intimate Partner Violence: Not on file  Outpatient Medications Prior to Visit  Medication Sig Dispense Refill   cetirizine (ZYRTEC) 10 MG tablet Take 1 tablet (10 mg total) by mouth daily. 90 tablet 3   fluticasone (FLONASE) 50 MCG/ACT nasal spray fluticasone propionate 50 mcg/actuation nasal spray,suspension  SHAKE LQ AND U 2 SPRAYS IEN D     RESTASIS 0.05 % ophthalmic emulsion 1 drop 2 (two) times daily.     rizatriptan (MAXALT-MLT) 10 MG disintegrating tablet Take 1 tablet (10 mg total) by mouth as needed for migraine. May repeat in 2 hours if needed. (max 2/24hrs). 10 tablet 5   amoxicillin-clavulanate (AUGMENTIN) 875-125 MG tablet Take 1 tablet by mouth 2 (two) times daily. 20 tablet 0   PAXLOVID 20 x 150 MG & 10 x 100MG  TBPK Take 3 tablets by mouth 2 (two) times daily.     No facility-administered medications prior to visit.    Allergies  Allergen Reactions   Other Anaphylaxis and Shortness Of Breath    curry     Review of Systems  Constitutional:  Negative for appetite change, chills and fever.  Respiratory:  Negative for cough and shortness of breath.   Cardiovascular:  Negative for chest pain.  Gastrointestinal:  Positive for abdominal pain. Negative for blood in stool, constipation, diarrhea, nausea and vomiting.       Firm and soft. No diarrhea. Muccus  Genitourinary:  Negative for dysuria, frequency, hematuria, urgency, vaginal bleeding, vaginal discharge and vaginal pain.      Objective:    Physical Exam Vitals and nursing note reviewed.  Constitutional:      General: She is not in acute distress.    Appearance: Normal appearance. She is well-developed. She is not ill-appearing.  Cardiovascular:     Rate and Rhythm: Regular rhythm. Tachycardia present.  Pulmonary:     Effort: Pulmonary effort is normal.     Breath sounds: Normal breath sounds.  Abdominal:     General: Abdomen is flat. Bowel sounds are decreased.     Palpations: Abdomen is soft.     Tenderness: There is abdominal tenderness in the suprapubic area.  Skin:    General: Skin is warm.  Neurological:     Mental Status: She is alert.  Psychiatric:        Mood and Affect: Mood normal.        Behavior: Behavior normal.        Thought Content: Thought content normal.        Judgment: Judgment normal.    BP 132/88   Pulse (!) 118   Temp 98.2 F (36.8 C)   Resp 12   Ht 5\' 4"  (1.626 m)   Wt 143 lb (64.9 kg)   LMP 02/01/2021   SpO2 98%   BMI 24.55 kg/m  Wt Readings from Last 3 Encounters:  02/15/21 143 lb (64.9 kg)  07/06/20 137 lb 6.4 oz (62.3 kg)  06/10/20 135 lb 3.2 oz (61.3 kg)    Health Maintenance Due  Topic Date Due   Pneumococcal Vaccine 48-16 Years old (1 - PCV) Never done   Hepatitis C Screening  Never done   COVID-19 Vaccine (3 - Pfizer risk series) 06/27/2019    There are no preventive care reminders to display for this patient.   Lab Results  Component Value Date   TSH 1.87  08/19/2019   Lab Results  Component Value Date   WBC 5.8 08/19/2019   HGB 14.0 08/19/2019   HCT 41.1 08/19/2019   MCV 90.7 08/19/2019  PLT 352.0 08/19/2019   Lab Results  Component Value Date   NA 140 08/19/2019   K 4.2 08/19/2019   CO2 26 08/19/2019   GLUCOSE 80 08/19/2019   BUN 10 08/19/2019   CREATININE 0.51 08/19/2019   BILITOT 0.7 08/19/2019   ALKPHOS 67 08/19/2019   AST 15 08/19/2019   ALT 19 08/19/2019   PROT 6.5 08/19/2019   ALBUMIN 4.9 08/19/2019   CALCIUM 9.7 08/19/2019   GFR 139.46 08/19/2019   Lab Results  Component Value Date   CHOL 187 08/19/2019   Lab Results  Component Value Date   HDL 45.30 08/19/2019   Lab Results  Component Value Date   LDLCALC 126 (H) 08/19/2019   Lab Results  Component Value Date   TRIG 79.0 08/19/2019   Lab Results  Component Value Date   CHOLHDL 4 08/19/2019   Lab Results  Component Value Date   HGBA1C 5.3 11/11/2018       Assessment & Plan:   Problem List Items Addressed This Visit       Other   Frequent bowel movements    States she is having frequent bowel movements mostly after she eats she will get cramping abdominal pain then have to have a bowel movement.  Pending abdominal x-ray and labs we will send in some Bentyl to help with cramping pain.      Lower abdominal pain - Primary    UA and pregnancy test negative in office.  Patient denies any vaginal symptoms as she was just on her period a couple weeks ago.  Doubt this has anything to do with GYN issues.  No history of diverticular disease per patient report.  Does have a history of gastric surgery but that was for an appendectomy when she was in college.  No small bowel obstructions or cesarean sections.  Pending lab results may need to pursue to CT scan if work-up negative given mucous in stool      Relevant Medications   dicyclomine (BENTYL) 10 MG capsule   Other Relevant Orders   CBC with Differential/Platelet   Comprehensive metabolic panel    Sedimentation rate   POCT urine pregnancy (Completed)   POCT URINALYSIS DIP (CLINITEK) (Completed)   DG Abd 2 Views     No orders of the defined types were placed in this encounter.  This visit occurred during the SARS-CoV-2 public health emergency.  Safety protocols were in place, including screening questions prior to the visit, additional usage of staff PPE, and extensive cleaning of exam room while observing appropriate contact time as indicated for disinfecting solutions.   Romilda Garret, NP

## 2021-02-15 NOTE — Assessment & Plan Note (Signed)
UA and pregnancy test negative in office.  Patient denies any vaginal symptoms as she was just on her period a couple weeks ago.  Doubt this has anything to do with GYN issues.  No history of diverticular disease per patient report.  Does have a history of gastric surgery but that was for an appendectomy when she was in college.  No small bowel obstructions or cesarean sections.  Pending lab results may need to pursue to CT scan if work-up negative given mucous in stool

## 2021-02-15 NOTE — Patient Instructions (Signed)
Nice to see you today Will follow up in regards to your labs and xray results Let me know via my chart how the medication does for the cramping

## 2021-02-15 NOTE — Assessment & Plan Note (Signed)
States she is having frequent bowel movements mostly after she eats she will get cramping abdominal pain then have to have a bowel movement.  Pending abdominal x-ray and labs we will send in some Bentyl to help with cramping pain.

## 2021-02-16 ENCOUNTER — Other Ambulatory Visit: Payer: Self-pay | Admitting: Nurse Practitioner

## 2021-02-16 DIAGNOSIS — R1030 Lower abdominal pain, unspecified: Secondary | ICD-10-CM

## 2021-02-16 LAB — COMPREHENSIVE METABOLIC PANEL
ALT: 22 U/L (ref 0–35)
AST: 19 U/L (ref 0–37)
Albumin: 4.8 g/dL (ref 3.5–5.2)
Alkaline Phosphatase: 62 U/L (ref 39–117)
BUN: 13 mg/dL (ref 6–23)
CO2: 25 mEq/L (ref 19–32)
Calcium: 9.7 mg/dL (ref 8.4–10.5)
Chloride: 103 mEq/L (ref 96–112)
Creatinine, Ser: 0.67 mg/dL (ref 0.40–1.20)
GFR: 114.51 mL/min (ref >60.00)
Glucose, Bld: 76 mg/dL (ref 70–99)
Potassium: 3.9 mEq/L (ref 3.5–5.1)
Sodium: 137 mEq/L (ref 135–145)
Total Bilirubin: 0.5 mg/dL (ref 0.2–1.2)
Total Protein: 6.8 g/dL (ref 6.0–8.3)

## 2021-02-16 LAB — CBC WITH DIFFERENTIAL/PLATELET
Basophils Absolute: 0.1 10*3/uL (ref 0.0–0.1)
Basophils Relative: 0.5 % (ref 0.0–3.0)
Eosinophils Absolute: 0.1 10*3/uL (ref 0.0–0.7)
Eosinophils Relative: 0.5 % (ref 0.0–5.0)
HCT: 41.4 % (ref 36.0–46.0)
Hemoglobin: 13.7 g/dL (ref 12.0–15.0)
Lymphocytes Relative: 12.8 % (ref 12.0–46.0)
Lymphs Abs: 2.5 10*3/uL (ref 0.7–4.0)
MCHC: 33.2 g/dL (ref 30.0–36.0)
MCV: 89.2 fl (ref 78.0–100.0)
Monocytes Absolute: 1.2 10*3/uL — ABNORMAL HIGH (ref 0.1–1.0)
Monocytes Relative: 6.2 % (ref 3.0–12.0)
Neutro Abs: 15.9 10*3/uL — ABNORMAL HIGH (ref 1.4–7.7)
Neutrophils Relative %: 80 % — ABNORMAL HIGH (ref 43.0–77.0)
Platelets: 386 10*3/uL (ref 150.0–400.0)
RBC: 4.64 Mil/uL (ref 3.87–5.11)
RDW: 13.5 % (ref 11.5–15.5)
WBC: 19.8 10*3/uL (ref 4.0–10.5)

## 2021-02-16 LAB — SEDIMENTATION RATE: Sed Rate: 1 mm/hr (ref 0–20)

## 2021-02-16 NOTE — Progress Notes (Signed)
Ct ab 

## 2021-02-17 ENCOUNTER — Other Ambulatory Visit: Payer: Self-pay

## 2021-02-17 ENCOUNTER — Other Ambulatory Visit: Payer: Self-pay | Admitting: Nurse Practitioner

## 2021-02-17 ENCOUNTER — Ambulatory Visit (HOSPITAL_BASED_OUTPATIENT_CLINIC_OR_DEPARTMENT_OTHER)
Admission: RE | Admit: 2021-02-17 | Discharge: 2021-02-17 | Disposition: A | Discharge status: Home / Self Care | Payer: Commercial Managed Care - PPO | Source: Ambulatory Visit | Attending: Nurse Practitioner | Admitting: Nurse Practitioner

## 2021-02-17 ENCOUNTER — Telehealth: Payer: Self-pay

## 2021-02-17 DIAGNOSIS — R1030 Lower abdominal pain, unspecified: Secondary | ICD-10-CM | POA: Insufficient documentation

## 2021-02-17 DIAGNOSIS — K6289 Other specified diseases of anus and rectum: Secondary | ICD-10-CM

## 2021-02-17 MED ORDER — IOHEXOL 300 MG/ML  SOLN
100.0000 mL | Freq: Once | INTRAMUSCULAR | Status: AC | PRN
  Administered 2021-02-17: 75 mL via INTRAVENOUS

## 2021-02-17 NOTE — Telephone Encounter (Signed)
Caitlin Duarte with [facility] called with call report for patient CT scan-results are in epic. [employer] notified.  IMPRESSION: Question of irregular thickening of the rectum with adjacent abnormal number of lymph nodes in the mesorectum. Infectious or inflammatory proctitis would be the most common etiology in a patient of this age. Given irregularity of the mucosa that is suggested and the surrounding lymph nodes would suggest follow-up direct visualization to exclude mucosal lesions.   Mild hepatic steatosis.   Nephrolithiasis in the lower pole of the RIGHT kidney.

## 2021-02-18 ENCOUNTER — Telehealth: Payer: Self-pay | Admitting: Nurse Practitioner

## 2021-02-18 NOTE — Telephone Encounter (Signed)
I called and spoke with the patient and her husband on Friday about CT scan and next results.   I also spoke to Dr. Thornton Park over Epic secure chat and the patient needs to be seen Monday if possible by GI. Dr. Tarri Glenn said she was going to reach out to the patient but I need to make sure this gets done.  The patients husband is an NP and reaching out to a colleague in [place]. Does not matter which GI she sees as long as she is seen.  They are also wanting stool studies. I am not placing an order for those because I would get the result instead of the GI provider  Thanks for your help

## 2021-02-20 ENCOUNTER — Telehealth: Payer: Self-pay | Admitting: Gastroenterology

## 2021-02-20 NOTE — Telephone Encounter (Signed)
Called the phone number listed. No answer.  Dr Silverio Decamp is aware. Very glad she was given an appointment for tomorrow. I will watch the schedule for any cancellation tomorrow that I can move her to.

## 2021-02-20 NOTE — Telephone Encounter (Signed)
Received MyChart direct message from NP Cable on Friday 02/17/21 re: questions about Mrs. Biddinger. I was already out of the office for the day. We briefly discussed her symptoms and CT findings by direct message. He was going to send a GI pathogen panel and start the patient on Cipro in the meantime.   I offered to try to work her in the office this week. Our staff is working to get her the next available appointment.

## 2021-02-20 NOTE — Telephone Encounter (Signed)
This patient called and wants to hear from Dr. Silverio Decamp or her nurse regarding an appointment.  Currently, she is scheduled with Ellouise Newer 12/13 at 3:30 as a new patient.  Her husband knows Dr. Woodward Ku husband and they both want patient to see Dr. Silverio Decamp.  Evidently, she went to PCP who said she may have a possible infection but before wanting to put her on antibiotics they wanted her to see gastro first.  Patient then called back after having been scheduled with Anderson Malta for tomorrow and wanted to know if Dr. Silverio Decamp agreed with this and wanted to talk to a nurse about it.

## 2021-02-20 NOTE — Telephone Encounter (Signed)
Received a message from Dr. Tarri Glenn this morning about this patient.  She said she wanted to see her sometime today or tomorrow.  I don't have any open appointments for her.  Can you call patient and let her know when Dr. Tarri Glenn can see her?  She would be a new patient.  Thank you.

## 2021-02-20 NOTE — Telephone Encounter (Signed)
Per chart notes, patient has an appointment with GI on 02/21/21, patient is also having a conversation about her needs with their office per chart 02/21/2021 3:30 PM Lemmon, Lavone Nian, PA

## 2021-02-21 ENCOUNTER — Ambulatory Visit: Payer: Commercial Managed Care - PPO | Admitting: Physician Assistant

## 2021-02-21 ENCOUNTER — Other Ambulatory Visit: Payer: Commercial Managed Care - PPO

## 2021-02-21 ENCOUNTER — Encounter: Payer: Self-pay | Admitting: Physician Assistant

## 2021-02-21 VITALS — BP 120/78 | HR 111 | Ht 64.0 in | Wt 142.0 lb

## 2021-02-21 DIAGNOSIS — D72829 Elevated white blood cell count, unspecified: Secondary | ICD-10-CM

## 2021-02-21 DIAGNOSIS — R194 Change in bowel habit: Secondary | ICD-10-CM | POA: Diagnosis not present

## 2021-02-21 DIAGNOSIS — R935 Abnormal findings on diagnostic imaging of other abdominal regions, including retroperitoneum: Secondary | ICD-10-CM | POA: Diagnosis not present

## 2021-02-21 DIAGNOSIS — R1030 Lower abdominal pain, unspecified: Secondary | ICD-10-CM

## 2021-02-21 MED ORDER — CIPROFLOXACIN HCL 500 MG PO TABS
500.0000 mg | ORAL_TABLET | Freq: Two times a day (BID) | ORAL | 0 refills | Status: AC
Start: 2021-02-21 — End: 2021-02-28

## 2021-02-21 MED ORDER — METRONIDAZOLE 500 MG PO TABS
500.0000 mg | ORAL_TABLET | Freq: Three times a day (TID) | ORAL | 0 refills | Status: AC
Start: 2021-02-21 — End: 2021-02-28

## 2021-02-21 NOTE — Progress Notes (Signed)
Chief Complaint: Abdominal pain and diarrhea  HPI:    Caitlin Duarte is a 35 year old female with past medical history as listed below, previously known to Dr. Tarri Glenn (wishing to change care to Dr. Silverio Decamp), who presents to clinic today with a complaint of abdominal pain and diarrhea.    02/15/2021 abdominal x-ray with mild retained fecal matter.    02/15/2021 CMP and ESR normal.  CBC with a white count 19.8.    02/17/2021 CT of the abdomen pelvis with contrast with question of irregular thickening of the rectum with adjacent abnormal number of lymph nodes in the mesorectum, infectious or inflammatory proctitis would be the most common etiology.  Given irregularity of the mucosa that is suggested in the surrounding lymph nodes which is follow-up with direct visualization to exclude mucosal lesions, mild hepatic steatosis and nephrolithiasis.    02/20/2021 Dr. Tarri Glenn noted she received a MyChart from NP [place] on 02/17/2021.  At that time discussed CT findings and recommended GI pathogen panel and starting the patient on Cipro.    Today, the patient presents to clinic and tells me that about 3 weeks ago she got her period and around that time was having some abdominal cramping and diarrhea but thought this was just related to her period.  Then she had different food for Thanksgiving and continued with cramping and diarrhea and thought it was maybe related to what she ate but it continued past that to the point where it started concerning her when she was running to the bathroom more than 5 or 6 times a day with painful cramping that woke her up in the middle of the night typically in her lower abdomen more to the left side.  Sometimes she would have diarrhea and other times she would have a small thin stool and other times just mucus.  Also sometimes she sits and feels like she has to go but cannot do anything.  Has also been more gassy and sometimes has a very "sharp pain" in her rectal area.  Does tell me she  vomited on a couple of occasions but feels like this was due to the degree of pain.  Over the past week or so symptoms have gotten minutely better but are definitely not gone.  Describes work-up as above by her PCP including CT and CBC with leukocytosis.    Her husband is a PA that works in pulmonology.    Denies fever, chills, weight loss or blood in her stool.  Past Medical History:  Diagnosis Date   Asthma    ?allergy induced asthma   Migraines    Seasonal allergies     Past Surgical History:  Procedure Laterality Date   APPENDECTOMY  2008   [place]   NOSE SURGERY  2006   RETINAL LASER PROCEDURE     SINUS SURGERY WITH INSTATRAK     TONSILLECTOMY  2020   WISDOM TOOTH EXTRACTION  2006    Current Outpatient Medications  Medication Sig Dispense Refill   cetirizine (ZYRTEC) 10 MG tablet Take 1 tablet (10 mg total) by mouth daily. 90 tablet 3   dicyclomine (BENTYL) 10 MG capsule Take 1 capsule (10 mg total) by mouth 3 (three) times daily as needed for up to 10 days for spasms. 30 capsule 0   fluticasone (FLONASE) 50 MCG/ACT nasal spray fluticasone propionate 50 mcg/actuation nasal spray,suspension  SHAKE LQ AND U 2 SPRAYS IEN D     RESTASIS 0.05 % ophthalmic emulsion 1  drop 2 (two) times daily.     rizatriptan (MAXALT-MLT) 10 MG disintegrating tablet Take 1 tablet (10 mg total) by mouth as needed for migraine. May repeat in 2 hours if needed. (max 2/24hrs). 10 tablet 5   No current facility-administered medications for this visit.    Allergies as of 02/21/2021 - Review Complete 02/21/2021  Allergen Reaction Noted   Other Anaphylaxis and Shortness Of Breath 09/15/2012    Family History  Problem Relation Age of Onset   Hypertension Father    Diabetes Father    Diverticulitis Father    Anemia Sister    Thyroid disease Sister    Headache Sister        ?migraines   Diverticulitis Sister    Colon polyps Sister    Alzheimer's disease Maternal Grandfather    Stroke  Maternal Grandfather    Diabetes Paternal Grandmother    Hypertension Paternal Grandmother    Heart disease Paternal Grandmother    Heart attack Paternal Grandmother    Diabetes Paternal Grandfather    Hypertension Paternal Grandfather    Colon cancer Neg Hx    Pancreatic cancer Neg Hx    Esophageal cancer Neg Hx    Liver cancer Neg Hx    Stomach cancer Neg Hx     Social History   Socioeconomic History   Marital status: Married    Spouse name: Not on file   Number of children: 2   Years of education: Not on file   Highest education level: Bachelor's degree (e.g., BA, AB, BS)  Occupational History   Not on file  Tobacco Use   Smoking status: Never   Smokeless tobacco: Never  Vaping Use   Vaping Use: Never used  Substance and Sexual Activity   Alcohol use: Never    Comment: socially   Drug use: No   Sexual activity: Yes    Birth control/protection: None  Other Topics Concern   Not on file  Social History Narrative   Lives at home with her husband and 2 sons   Right handed    Caffeine: 1 cup daily   Social Determinants of Health   Financial Resource Strain: Not on file  Food Insecurity: Not on file  Transportation Needs: Not on file  Physical Activity: Not on file  Stress: Not on file  Social Connections: Not on file  Intimate Partner Violence: Not on file    Review of Systems:    Constitutional: No weight loss, fever or chills Skin: No rash  Cardiovascular: No chest pain Respiratory: No SOB Gastrointestinal: See HPI and otherwise negative Genitourinary: No dysuria  Neurological: No headache, dizziness or syncope Musculoskeletal: No new muscle or joint pain Hematologic: No bleeding  Psychiatric: No history of depression or anxiety   Physical Exam:  Vital signs: BP 120/78    Pulse (!) 111    Ht '5\' 4"'  (1.626 m)    Wt 142 lb (64.4 kg)    LMP 02/01/2021    SpO2 97%    BMI 24.37 kg/m    Constitutional:   Pleasant Caucasian female appears to be in NAD,  Well developed, Well nourished, alert and cooperative Head:  Normocephalic and atraumatic. Eyes:   PEERL, EOMI. No icterus. Conjunctiva pink. Ears:  Normal auditory acuity. Neck:  Supple Throat: Oral cavity and pharynx without inflammation, swelling or lesion.  Respiratory: Respirations even and unlabored. Lungs clear to auscultation bilaterally.   No wheezes, crackles, or rhonchi.  Cardiovascular: Normal S1, S2. No MRG. Regular  rate and rhythm. No peripheral edema, cyanosis or pallor.  Gastrointestinal:  Soft, nondistended, moderate bilateral lower TTP, some worse in the left lower quadrant, no rebound or guarding. Normal bowel sounds. No appreciable masses or hepatomegaly. Rectal:  Not performed.  Msk:  Symmetrical without gross deformities. Without edema, no deformity or joint abnormality.  Neurologic:  Alert and  oriented x4;  grossly normal neurologically.  Skin:   Dry and intact without significant lesions or rashes. Psychiatric: Demonstrates good judgement and reason without abnormal affect or behaviors.  RELEVANT LABS AND IMAGING: CBC    Component Value Date/Time   WBC 19.8 Repeated and verified X2. (HH) 02/15/2021 1615   RBC 4.64 02/15/2021 1615   HGB 13.7 02/15/2021 1615   HGB 13.4 02/26/2018 1538   HGB 14.2 10/27/2013 1009   HCT 41.4 02/15/2021 1615   HCT 37.2 02/26/2018 1538   PLT 386.0 02/15/2021 1615   PLT 509 (H) 02/26/2018 1538   MCV 89.2 02/15/2021 1615   MCV 84 02/26/2018 1538   MCH 30.1 02/26/2018 1538   MCH 30.6 02/28/2017 0517   MCHC 33.2 02/15/2021 1615   RDW 13.5 02/15/2021 1615   RDW 12.3 02/26/2018 1538   LYMPHSABS 2.5 02/15/2021 1615   MONOABS 1.2 (H) 02/15/2021 1615   EOSABS 0.1 02/15/2021 1615   BASOSABS 0.1 02/15/2021 1615    CMP     Component Value Date/Time   NA 137 02/15/2021 1615   NA 145 (H) 02/26/2018 1538   K 3.9 02/15/2021 1615   CL 103 02/15/2021 1615   CO2 25 02/15/2021 1615   GLUCOSE 76 02/15/2021 1615   BUN 13 02/15/2021 1615    BUN 12 02/26/2018 1538   CREATININE 0.67 02/15/2021 1615   CREATININE 0.65 06/20/2016 1557   CALCIUM 9.7 02/15/2021 1615   PROT 6.8 02/15/2021 1615   PROT 6.6 02/26/2018 1538   ALBUMIN 4.8 02/15/2021 1615   ALBUMIN 4.8 02/26/2018 1538   AST 19 02/15/2021 1615   ALT 22 02/15/2021 1615   ALKPHOS 62 02/15/2021 1615   BILITOT 0.5 02/15/2021 1615   BILITOT 0.3 02/26/2018 1538   GFRNONAA 118 02/26/2018 1538   GFRAA 136 02/26/2018 1538    Assessment: 1.  Change in bowel habits: Towards looser/more frequent stools; likely related to proctitis seen at time of CT 2.  Lower abdominal pain: With above 3.  Leukocytosis 4.  Abnormal CT of the rectum: Showing inflammation; question bacterial etiology versus IBD  Plan: 1.  Discussed findings of labs and CT with the patient.  Answered all of her questions.  Explained that there is a possibility of bacterial infection versus IBD. 2.  Recommend a GI pathogen panel to consider bacterial infection prior to initiating antibiotics, but we will go ahead and send them in so she can start them as soon as she provides a sample. 3.  Prescribed Cipro 500 mg twice daily x7 days, #14. 4.  Prescribed Flagyl 500 mg 3 times daily x7 days, #21. 5.  Discussed that the antibiotics above can have some GI associated symptoms and recommended that she not drink etoh with these antibiotics. 6.  Discussed with patient that pending findings from GI pathogen panel we will need to complete a colonoscopy.  If she has a bacteria we will wait about 4 weeks after treatment with antibiotics for a colonoscopy.  If there is no sign of bacteria we will go ahead and schedule one in the near future. 7.  Patient to follow in clinic per recommendations  after GI pathogen panel. Assigned to Dr. Silverio Decamp today.  Caitlin Newer, PA-C [facility] 02/21/2021, 3:32 PM  Cc: Ronnald Nian, DO

## 2021-02-21 NOTE — Patient Instructions (Signed)
Your provider has requested that you go to the basement level for lab work before leaving today. Press "B" on the elevator. The lab is located at the first door on the left as you exit the elevator.  We have sent the following medications to your pharmacy for you to pick up at your convenience: Cipro 500 mg twice daily for 7 days. Flagyl 500 mg three times daily for 7 days.  If you are age 33 or older, your body mass index should be between 23-30. Your Body mass index is 24.37 kg/m. If this is out of the aforementioned range listed, please consider follow up with your Primary Care Provider.  If you are age 64 or younger, your body mass index should be between 19-25. Your Body mass index is 24.37 kg/m. If this is out of the aformentioned range listed, please consider follow up with your Primary Care Provider.   ________________________________________________________  The [facility] providers would like to encourage you to use [facility] to communicate with providers for non-urgent requests or questions.  Due to long hold times on the telephone, sending your provider a message by [facility] may be a faster and more efficient way to get a response.  Please allow 48 business hours for a response.  Please remember that this is for non-urgent requests.  _______________________________________________________

## 2021-02-22 ENCOUNTER — Other Ambulatory Visit: Payer: Commercial Managed Care - PPO

## 2021-02-22 DIAGNOSIS — R935 Abnormal findings on diagnostic imaging of other abdominal regions, including retroperitoneum: Secondary | ICD-10-CM

## 2021-02-22 DIAGNOSIS — D72829 Elevated white blood cell count, unspecified: Secondary | ICD-10-CM

## 2021-02-22 DIAGNOSIS — R194 Change in bowel habit: Secondary | ICD-10-CM

## 2021-02-22 NOTE — Progress Notes (Signed)
Await GI pathogen panel. Will empirically start oral Cipro and flagyl for possible bacterial gastroenteritis/proctitis.  Will need colonoscopy to further evaluate rectal mucosa and exclude any mucosal lesion, further evaluate findings on CT abd & pelvis, pending GI pathogen panel results.  Reviewed and agree with documentation and assessment and plan. Damaris Hippo , MD

## 2021-02-26 LAB — GI PROFILE, STOOL, PCR

## 2021-03-02 ENCOUNTER — Telehealth: Payer: Self-pay

## 2021-03-02 ENCOUNTER — Encounter: Payer: Self-pay | Admitting: Gastroenterology

## 2021-03-02 DIAGNOSIS — R935 Abnormal findings on diagnostic imaging of other abdominal regions, including retroperitoneum: Secondary | ICD-10-CM

## 2021-03-02 DIAGNOSIS — R1030 Lower abdominal pain, unspecified: Secondary | ICD-10-CM

## 2021-03-02 DIAGNOSIS — D72829 Elevated white blood cell count, unspecified: Secondary | ICD-10-CM

## 2021-03-02 DIAGNOSIS — R194 Change in bowel habit: Secondary | ICD-10-CM

## 2021-03-02 MED ORDER — NA SULFATE-K SULFATE-MG SULF 17.5-3.13-1.6 GM/177ML PO SOLN
1.0000 | ORAL | 0 refills | Status: DC
Start: 2021-03-02 — End: 2021-03-08

## 2021-03-02 NOTE — Telephone Encounter (Signed)
-----   Message from Levin Erp, [place] sent at 03/01/2021 11:42 AM EST ----- Regarding: colonoscopy Can you call this patient and check in with her.  I have been trying to hear back from Dr. Silverio Decamp in regards to colonoscopy timing, but have not.  I think we can go ahead and schedule her for 1 given that she does not have any infectious origin in her stools and ongoing symptoms.  Go ahead and schedule her in the [facility] with Dr. Silverio Decamp ASAP if possible.  If Dr. Silverio Decamp does not have an opening can ask the patient if she would mind being seen by someone else and fit her and is able.  I know she is trying to get this done over her school break.  Thanks, JL L

## 2021-03-02 NOTE — Telephone Encounter (Signed)
Called and spoke with patient in regards to recommendations. Pt is aware that Dr. Silverio Decamp does not have any availability for a procedure before the patient returns back to work. Pt is OK with another provider performing her colonoscopy. Pt has been scheduled for a colonoscopy in the [facility] with Dr. Tarri Glenn on Wednesday, 03/08/21 at 9 am. Pt is aware that she will need to arrive on the 4th floor by 8 am with a care partner. Pt would like prep sent to pharmacy on file. Pt is aware that I will send her instructions via my chart, she confirmed that she has access. Pt verbalized understanding and had no concerns at the end of the call.   Ambulatory referral to GI in epic.   SUPREP sent to pharmacy on file. Instructions sent to patient via my chart.

## 2021-03-06 ENCOUNTER — Encounter: Payer: Self-pay | Admitting: Gastroenterology

## 2021-03-08 ENCOUNTER — Encounter: Payer: Self-pay | Admitting: Gastroenterology

## 2021-03-08 ENCOUNTER — Ambulatory Visit (AMBULATORY_SURGERY_CENTER): Payer: Commercial Managed Care - PPO | Admitting: Gastroenterology

## 2021-03-08 ENCOUNTER — Other Ambulatory Visit: Payer: Self-pay

## 2021-03-08 VITALS — BP 101/69 | HR 89 | Temp 98.2°F | Resp 17 | Ht 64.0 in | Wt 142.0 lb

## 2021-03-08 DIAGNOSIS — K5289 Other specified noninfective gastroenteritis and colitis: Secondary | ICD-10-CM | POA: Diagnosis not present

## 2021-03-08 DIAGNOSIS — R194 Change in bowel habit: Secondary | ICD-10-CM | POA: Diagnosis not present

## 2021-03-08 DIAGNOSIS — K529 Noninfective gastroenteritis and colitis, unspecified: Secondary | ICD-10-CM

## 2021-03-08 DIAGNOSIS — K5730 Diverticulosis of large intestine without perforation or abscess without bleeding: Secondary | ICD-10-CM | POA: Diagnosis not present

## 2021-03-08 MED ORDER — SODIUM CHLORIDE 0.9 % IV SOLN: 500.0000 mL | Freq: Once | INTRAVENOUS | Status: DC

## 2021-03-08 NOTE — Patient Instructions (Signed)
Discharge instructions given. Biopsies taken. Resume previous medications. YOU HAD AN ENDOSCOPIC PROCEDURE TODAY AT THE [facility]:   Refer to the procedure report that was given to you for any specific questions about what was found during the examination.  If the procedure report does not answer your questions, please call your gastroenterologist to clarify.  If you requested that your care partner not be given the details of your procedure findings, then the procedure report has been included in a sealed envelope for you to review at your convenience later.  YOU SHOULD EXPECT: Some feelings of bloating in the abdomen. Passage of more gas than usual.  Walking can help get rid of the air that was put into your GI tract during the procedure and reduce the bloating. If you had a lower endoscopy (such as a colonoscopy or flexible sigmoidoscopy) you may notice spotting of blood in your stool or on the toilet paper. If you underwent a bowel prep for your procedure, you may not have a normal bowel movement for a few days.  Please Note:  You might notice some irritation and congestion in your nose or some drainage.  This is from the oxygen used during your procedure.  There is no need for concern and it should clear up in a day or so.  SYMPTOMS TO REPORT IMMEDIATELY:   Following lower endoscopy (colonoscopy or flexible sigmoidoscopy):  Excessive amounts of blood in the stool  Significant tenderness or worsening of abdominal pains  Swelling of the abdomen that is new, acute  Fever of 100F or higher   For urgent or emergent issues, a gastroenterologist can be reached at any hour by calling [phone]. Do not use MyChart messaging for urgent concerns.    DIET:  We do recommend a small meal at first, but then you may proceed to your regular diet.  Drink plenty of fluids but you should avoid alcoholic beverages for 24 hours.  ACTIVITY:  You should plan to take it easy for the rest of  today and you should NOT DRIVE or use heavy machinery until tomorrow (because of the sedation medicines used during the test).    FOLLOW UP: Our staff will call the number listed on your records 48-72 hours following your procedure to check on you and address any questions or concerns that you may have regarding the information given to you following your procedure. If we do not reach you, we will leave a message.  We will attempt to reach you two times.  During this call, we will ask if you have developed any symptoms of COVID 19. If you develop any symptoms (ie: fever, flu-like symptoms, shortness of breath, cough etc.) before then, please call [phone].  If you test positive for Covid 19 in the 2 weeks post procedure, please call and report this information to us.    If any biopsies were taken you will be contacted by phone or by letter within the next 1-3 weeks.  Please call us at [phone] if you have not heard about the biopsies in 3 weeks.    SIGNATURES/CONFIDENTIALITY: You and/or your care partner have signed paperwork which will be entered into your electronic medical record.  These signatures attest to the fact that that the information above on your After Visit Summary has been reviewed and is understood.  Full responsibility of the confidentiality of this discharge information lies with you and/or your care-partner. 

## 2021-03-08 NOTE — Progress Notes (Signed)
Report to PACU, RN, vss, BBS= Clear.  

## 2021-03-08 NOTE — Op Note (Signed)
[facility] Patient Name: Caitlin Duarte Procedure Date: 03/08/2021 9:10 AM MRN: [id number] Endoscopist: Thornton Park MD, MD Age: 35 Referring MD:  Date of Birth: 18-Feb-1986 Gender: Female Account #: [id number] Procedure:                Colonoscopy Indications:              Abdominal pain, Abnormal CT of the GI tract, Change                            in bowel habits Medicines:                Monitored Anesthesia Care Procedure:                Pre-Anesthesia Assessment:                           - Prior to the procedure, a History and Physical                            was performed, and patient medications and                            allergies were reviewed. The patient's tolerance of                            previous anesthesia was also reviewed. The risks                            and benefits of the procedure and the sedation                            options and risks were discussed with the patient.                            All questions were answered, and informed consent                            was obtained. Prior Anticoagulants: The patient has                            taken no previous anticoagulant or antiplatelet                            agents. ASA Grade Assessment: II - A patient with                            mild systemic disease. After reviewing the risks                            and benefits, the patient was deemed in                            satisfactory condition to undergo the procedure.  After obtaining informed consent, the colonoscope                            was passed under direct vision. Throughout the                            procedure, the patient's blood pressure, pulse, and                            oxygen saturations were monitored continuously. The                            Olympus PCF-H190DL (#[id number]) Colonoscope was                            introduced through the anus and advanced to  the 8                            cm into the ileum. The colonoscopy was performed                            without difficulty. The patient tolerated the                            procedure well. The quality of the bowel                            preparation was excellent. The terminal ileum,                            ileocecal valve, appendiceal orifice, and rectum                            were photographed. Scope In: 9:18:45 AM Scope Out: 9:35:28 AM Scope Withdrawal Time: 0 hours 13 minutes 13 seconds  Total Procedure Duration: 0 hours 16 minutes 43 seconds  Findings:                 The perianal and digital rectal examinations were                            normal.                           The mucosa in the rectum has a prominent nodular                            appearance most consistent with lymphoid                            aggregates. There are two areas of focal erythema.                            Biopsies were taken with a cold forceps for  histology. Estimated blood loss was minimal.                           A localized area of mildly erythematous mucosa was                            also found at the appendiceal orifice. Biopsies                            were taken with a cold forceps for histology.                            Estimated blood loss was minimal.                           The remainder of the examined colonic mucosa                            appeared normal except for mild diverticulosis in                            the ascending colon. Biopsies were taken from the                            right colon, transverse colon, and left colon with                            a cold forceps for histology. Estimated blood loss                            was minimal.                           The terminal ileum appeared normal. Biopsies were                            taken with a cold forceps for histology. Estimated                             blood loss was minimal. Complications:            No immediate complications. Estimated blood loss:                            Minimal. Estimated Blood Loss:     Estimated blood loss was minimal. Impression:               - Focal erythema in the rectum. This is of unclear                            clinical significance Biopsied.                           - Erythematous mucosa at the appendiceal orifice.  Biopsied.                           - Mild diverticulosis in the ascending colon.                           - The remainder of the examined colon is normal.                            Biopsied.                           - The examination was otherwise normal on direct                            and retroflexion views. Recommendation:           - Patient has a contact number available for                            emergencies. The signs and symptoms of potential                            delayed complications were discussed with the                            patient. Return to normal activities tomorrow.                            Written discharge instructions were provided to the                            patient.                           - Resume previous diet.                           - Continue present medications.                           - Await pathology results.                           - Additional recommendations will be made after                            reviewing the pathology results. Thornton Park MD, MD 03/08/2021 9:44:18 AM This report has been signed electronically.

## 2021-03-08 NOTE — Progress Notes (Signed)
Referring Provider: Ronnald Nian, DO Primary Care Physician:  Ronnald Nian, DO  Reason for Procedure:  Change in bowel habits   IMPRESSION:  Change in bowel habits Abnormal CT scan Appropriate candidate for monitored anesthesia care  PLAN: Colonoscopy in the [facility] today   HPI: Caitlin Duarte is a 35 y.o. female presents for colonoscopy to evaluate a change in bowel habits, lower abdominal pain, and a CT scan showing possible rectal inflammation. Please see [facility] consultation note from 02/21/21 for full details.    Past Medical History:  Diagnosis Date   Asthma    ?allergy induced asthma   Migraines    Seasonal allergies     Past Surgical History:  Procedure Laterality Date   APPENDECTOMY  2008   [place]   NOSE SURGERY  2006   RETINAL LASER PROCEDURE     SINUS SURGERY WITH INSTATRAK     TONSILLECTOMY  2020   WISDOM TOOTH EXTRACTION  2006    Current Outpatient Medications  Medication Sig Dispense Refill   cetirizine (ZYRTEC) 10 MG tablet Take 1 tablet (10 mg total) by mouth daily. 90 tablet 3   fluticasone (FLONASE) 50 MCG/ACT nasal spray fluticasone propionate 50 mcg/actuation nasal spray,suspension  SHAKE LQ AND U 2 SPRAYS IEN D     RESTASIS 0.05 % ophthalmic emulsion 1 drop 2 (two) times daily.     rizatriptan (MAXALT-MLT) 10 MG disintegrating tablet Take 1 tablet (10 mg total) by mouth as needed for migraine. May repeat in 2 hours if needed. (max 2/24hrs). 10 tablet 5   dicyclomine (BENTYL) 10 MG capsule Take 1 capsule (10 mg total) by mouth 3 (three) times daily as needed for up to 10 days for spasms. 30 capsule 0   Current Facility-Administered Medications  Medication Dose Route Frequency Provider Last Rate Last Admin   0.9 %  sodium chloride infusion  500 mL Intravenous Once Thornton Park, MD        Allergies as of 03/08/2021 - Review Complete 03/08/2021  Allergen Reaction Noted   Other Anaphylaxis and Shortness Of Breath  09/15/2012    Family History  Problem Relation Age of Onset   Hypertension Father    Diabetes Father    Diverticulitis Father    Anemia Sister    Thyroid disease Sister    Headache Sister        ?migraines   Diverticulitis Sister    Colon polyps Sister    Alzheimer's disease Maternal Grandfather    Stroke Maternal Grandfather    Diabetes Paternal Grandmother    Hypertension Paternal Grandmother    Heart disease Paternal Grandmother    Heart attack Paternal Grandmother    Diabetes Paternal Grandfather    Hypertension Paternal Grandfather    Colon cancer Neg Hx    Pancreatic cancer Neg Hx    Esophageal cancer Neg Hx    Liver cancer Neg Hx    Stomach cancer Neg Hx      Physical Exam: General:   Alert,  well-nourished, pleasant and cooperative in NAD Head:  Normocephalic and atraumatic. Eyes:  Sclera clear, no icterus.   Conjunctiva pink. Mouth:  No deformity or lesions.   Neck:  Supple; no masses or thyromegaly. Lungs:  Clear throughout to auscultation.   No wheezes. Heart:  Regular rate and rhythm; no murmurs. Abdomen:  Soft, non-tender, nondistended, normal bowel sounds, no rebound or guarding.  Msk:  Symmetrical. No boney deformities LAD: No inguinal or umbilical  LAD Extremities:  No clubbing or edema. Neurologic:  Alert and  oriented x4;  grossly nonfocal Skin:  No obvious rash or bruise. Psych:  Alert and cooperative. Normal mood and affect.     Studies/Results: No results found.    Harris Penton L. Tarri Glenn, MD, MPH 03/08/2021, 9:06 AM

## 2021-03-08 NOTE — Progress Notes (Signed)
[name]  Pt's states no medical or surgical changes since previsit or office visit.

## 2021-03-08 NOTE — Progress Notes (Signed)
Called to room to assist during endoscopic procedure.  Patient ID and intended procedure confirmed with present staff. Received instructions for my participation in the procedure from the performing physician.  

## 2021-03-10 ENCOUNTER — Telehealth: Payer: Self-pay

## 2021-03-10 NOTE — Telephone Encounter (Signed)
Left message on answering machine. 

## 2021-03-17 ENCOUNTER — Encounter: Payer: Self-pay | Admitting: Gastroenterology

## 2021-03-22 ENCOUNTER — Telehealth: Payer: Self-pay

## 2021-03-22 NOTE — Telephone Encounter (Signed)
Called [facility] to follow up on patient's pathology results. Rosann Auerbach states that Dr. Melina Copa, the pathologist that is assigned to this case, has not completed her review. Rosann Auerbach states that she has some diagnoses but it is not complete. She states that she will reach out to Dr. Melina Copa to follow up on this and Dr. Tarri Glenn should receive a call.

## 2021-03-23 NOTE — Telephone Encounter (Signed)
Noted, thanks!

## 2021-03-24 ENCOUNTER — Other Ambulatory Visit: Payer: Self-pay

## 2021-03-24 DIAGNOSIS — R1030 Lower abdominal pain, unspecified: Secondary | ICD-10-CM

## 2021-03-24 MED ORDER — DICYCLOMINE HCL 10 MG PO CAPS: 10.0000 mg | ORAL_CAPSULE | Freq: Three times a day (TID) | ORAL | 3 refills | Status: DC | PRN

## 2021-03-24 MED ORDER — BUDESONIDE 3 MG PO CPEP: ORAL_CAPSULE | ORAL | 1 refills | Status: DC

## 2021-03-28 ENCOUNTER — Telehealth: Payer: Self-pay | Admitting: Gastroenterology

## 2021-03-28 NOTE — Telephone Encounter (Signed)
Patient returning your call, please advise. 

## 2021-03-28 NOTE — Telephone Encounter (Signed)
Spoke with the patient. Scheduled her for a follow up appointment with [facility]. Reports she is doing well on budesonide.

## 2021-04-19 ENCOUNTER — Ambulatory Visit: Payer: Commercial Managed Care - PPO | Admitting: Physician Assistant

## 2021-05-03 ENCOUNTER — Encounter: Payer: Commercial Managed Care - PPO | Admitting: Family Medicine

## 2021-05-04 ENCOUNTER — Encounter: Payer: Self-pay | Admitting: Physician Assistant

## 2021-05-04 ENCOUNTER — Ambulatory Visit: Payer: 59 | Admitting: Physician Assistant

## 2021-05-04 VITALS — BP 114/80 | HR 88 | Ht 64.75 in | Wt 132.5 lb

## 2021-05-04 DIAGNOSIS — K529 Noninfective gastroenteritis and colitis, unspecified: Secondary | ICD-10-CM

## 2021-05-04 NOTE — Progress Notes (Signed)
Chief Complaint: Follow-up nonspecific colitis  HPI:    Caitlin Duarte is a 36 year old female with past medical history as listed below, known to Dr. Silverio Decamp, who returns to clinic today for follow-up of her nonspecific colitis.    02/21/2021 patient initially seen in clinic after being in the ER with abnormal CT showing irregular thickening of the rectum with adjacent abnormal number of lymph nodes as well as abdominal pain and diarrhea.  She was arranged for stool studies and given a 7-day course of Cipro and Flagyl.    02/22/2021 stool studies were negative.    03/08/2021 colonoscopy with Dr. Tarri Glenn with focal erythema in the rectum, erythematous mucosa at the appendiceal orifice and mild diverticulosis in the ascending colon.  Biopsy showed active colitis in the a sending colon which was nonspecific.  Patient started on Budesonide 9 mg daily for 30 days followed by a taper of 6 mg daily for 2 weeks and then 3 mg daily for an additional 2 weeks.  Also given Dicyclomine 10 mg every 8 hours for abdominal cramping.    Today, the patient tells me that since starting the medicine things are much much better.  Does tell me she tapered down to 6 mg about a week ago and has done okay but did miss a dose at 1 point given that she has a lot of things going on in her life and she had a very bad day that day.  Overall things are good but she wants to make sure that she will not have a return of all of her symptoms when she tapers off the medicine.  She initially was using Dicyclomine 10 mg quite often, but has only used this a few days recently.  Does describe a lot of stress with family health concerns as well as a GI bug which went around her house and her children recently.  Tells me that she does have increase of symptoms when she is stressed.    Also notes about a 10 pound weight loss since being seen in our clinic last.  Does tell me that she was in the hospital taking care of her mom for about 5 days and was  not eating as much and has continued with some level of stress since then.    Denies fever, chills, weight loss or blood in her stool.  Past Medical History:  Diagnosis Date   Asthma    ?allergy induced asthma   Migraines    Seasonal allergies     Past Surgical History:  Procedure Laterality Date   APPENDECTOMY  2008   [place]   NOSE SURGERY  2006   RETINAL LASER PROCEDURE     SINUS SURGERY WITH INSTATRAK     TONSILLECTOMY  2020   WISDOM TOOTH EXTRACTION  2006    Current Outpatient Medications  Medication Sig Dispense Refill   budesonide (ENTOCORT EC) 3 MG 24 hr capsule 9 mg PO x 30 days then 6 mg PO x 14 days then 3 mg PO x 14 days 90 capsule 1   cetirizine (ZYRTEC) 10 MG tablet Take 1 tablet (10 mg total) by mouth daily. 90 tablet 3   dicyclomine (BENTYL) 10 MG capsule Take 1 capsule (10 mg total) by mouth every 8 (eight) hours as needed for spasms. 90 capsule 3   fluticasone (FLONASE) 50 MCG/ACT nasal spray fluticasone propionate 50 mcg/actuation nasal spray,suspension  SHAKE LQ AND U 2 SPRAYS IEN D  mupirocin ointment (BACTROBAN) 2 % Apply 1 application topically. 2-3 times daily     RESTASIS 0.05 % ophthalmic emulsion 1 drop 2 (two) times daily.     rizatriptan (MAXALT-MLT) 10 MG disintegrating tablet Take 1 tablet (10 mg total) by mouth as needed for migraine. May repeat in 2 hours if needed. (max 2/24hrs). 10 tablet 5   No current facility-administered medications for this visit.    Allergies as of 05/04/2021 - Review Complete 05/04/2021  Allergen Reaction Noted   Other Anaphylaxis and Shortness Of Breath 09/15/2012    Family History  Problem Relation Age of Onset   Hypertension Father    Diabetes Father    Diverticulitis Father    Anemia Sister    Thyroid disease Sister    Headache Sister        ?migraines   Diverticulitis Sister    Colon polyps Sister    Alzheimer's disease Maternal Grandfather    Stroke Maternal Grandfather    Diabetes  Paternal Grandmother    Hypertension Paternal Grandmother    Heart disease Paternal Grandmother    Heart attack Paternal Grandmother    Diabetes Paternal Grandfather    Hypertension Paternal Grandfather    Colon cancer Neg Hx    Pancreatic cancer Neg Hx    Esophageal cancer Neg Hx    Liver cancer Neg Hx    Stomach cancer Neg Hx     Social History   Socioeconomic History   Marital status: Married    Spouse name: Not on file   Number of children: 2   Years of education: Not on file   Highest education level: Bachelor's degree (e.g., BA, AB, BS)  Occupational History   Not on file  Tobacco Use   Smoking status: Never   Smokeless tobacco: Never  Vaping Use   Vaping Use: Never used  Substance and Sexual Activity   Alcohol use: Never    Comment: socially   Drug use: No   Sexual activity: Yes    Birth control/protection: None  Other Topics Concern   Not on file  Social History Narrative   Lives at home with her husband and 2 sons   Right handed    Caffeine: 1 cup daily   Social Determinants of Health   Financial Resource Strain: Not on file  Food Insecurity: Not on file  Transportation Needs: Not on file  Physical Activity: Not on file  Stress: Not on file  Social Connections: Not on file  Intimate Partner Violence: Not on file    Review of Systems:    Constitutional: No weight loss, fever or chills Cardiovascular: No chest pain Respiratory: No SOB  Gastrointestinal: See HPI and otherwise negative   Physical Exam:  Vital signs: BP 114/80 (BP Location: Left Arm, Patient Position: Sitting, Cuff Size: Normal)    Pulse 88    Ht 5' 4.75" (1.645 m) Comment: height measured without shoes   Wt 132 lb 8 oz (60.1 kg)    LMP 04/21/2021    BMI 22.22 kg/m   Constitutional:   Pleasant Caucasian female appears to be in NAD, Well developed, Well nourished, alert and cooperative Respiratory: Respirations even and unlabored. Lungs clear to auscultation bilaterally.   No  wheezes, crackles, or rhonchi.  Cardiovascular: Normal S1, S2. No MRG. Regular rate and rhythm. No peripheral edema, cyanosis or pallor.  Gastrointestinal:  Soft, nondistended, nontender. No rebound or guarding. Normal bowel sounds. No appreciable masses or hepatomegaly. Psychiatric:  Demonstrates good judgement and  reason without abnormal affect or behaviors.  See HPI for recent labs and imaging.  Assessment: 1.  Nonspecific colitis: Presented with diarrhea and abdominal cramping, recent colonoscopy with nonspecific colitis, improving on Budesonide 2.  IBS: Describes increase in diarrhea and abdominal cramping with stress  Plan: 1.  Discussed with patient that she likely has some IBS overlap given increase in symptoms of stress and anxiety.  Explained that she can continue to use the Dicyclomine 10 mg as needed.  Can increase dosing to every 4-6 hours if necessary. 2.  Continue on Budesonide taper.  She has another week of 6 mg left, then will taper down to 3 mg for 2 weeks and stop.  Explained that if she tapers down and feels like symptoms are worse she can call and let us know.  She may need to stay on the 6 mg for longer.  If she continues to do well she will hopefully be able to stop this medication completely. 3.  Briefly discussed weight loss, this is about 9 pounds since December which is likely related to increase in stress. 4.  Patient to follow in clinic with [place] as needed.  Told her to keep in touch via MyChart in regards to symptoms.  Ellouise Newer, PA-C [facility] 05/04/2021, 3:56 PM  Cc: No ref. provider found

## 2021-05-04 NOTE — Patient Instructions (Signed)
If you are age 34 or older, your body mass index should be between 23-30. Your Body mass index is 22.22 kg/m. If this is out of the aforementioned range listed, please consider follow up with your Primary Care Provider.  If you are age 41 or younger, your body mass index should be between 19-25. Your Body mass index is 22.22 kg/m. If this is out of the aformentioned range listed, please consider follow up with your Primary Care Provider.   ________________________________________________________  The [facility] providers would like to encourage you to use [facility] to communicate with providers for non-urgent requests or questions.  Due to long hold times on the telephone, sending your provider a message by [facility] may be a faster and more efficient way to get a response.  Please allow 48 business hours for a response.  Please remember that this is for non-urgent requests.  _______________________________________________________  Thank you for choosing me and [facility].  Ellouise Newer PA

## 2021-05-14 DIAGNOSIS — J301 Allergic rhinitis due to pollen: Secondary | ICD-10-CM

## 2021-05-15 MED ORDER — BUDESONIDE 3 MG PO CPEP: ORAL_CAPSULE | ORAL | 0 refills | Status: AC

## 2021-05-15 NOTE — Telephone Encounter (Signed)
Let's have her stay on the Budesonide 6 mg (2 tabs) for another 4 weeks and then try tapering down to one for 2 weeks and then stop. Have her let us know if she is feeling better after going back up in dosage. Thanks-JLL  ?She will need another script (not sure how many she has left). Thanks!  ? ?New RX for Budesonide sent to pharmacy on file. Patient notified via my chart. ?

## 2021-06-13 ENCOUNTER — Ambulatory Visit (INDEPENDENT_AMBULATORY_CARE_PROVIDER_SITE_OTHER): Payer: 59 | Admitting: Family Medicine

## 2021-06-13 ENCOUNTER — Encounter: Payer: Self-pay | Admitting: Family Medicine

## 2021-06-13 VITALS — BP 120/72 | HR 94 | Temp 97.4°F | Ht 64.75 in | Wt 136.4 lb

## 2021-06-13 DIAGNOSIS — J301 Allergic rhinitis due to pollen: Secondary | ICD-10-CM | POA: Diagnosis not present

## 2021-06-13 DIAGNOSIS — G43709 Chronic migraine without aura, not intractable, without status migrainosus: Secondary | ICD-10-CM | POA: Diagnosis not present

## 2021-06-13 DIAGNOSIS — K529 Noninfective gastroenteritis and colitis, unspecified: Secondary | ICD-10-CM

## 2021-06-13 DIAGNOSIS — K760 Fatty (change of) liver, not elsewhere classified: Secondary | ICD-10-CM

## 2021-06-13 DIAGNOSIS — H3589 Other specified retinal disorders: Secondary | ICD-10-CM

## 2021-06-13 DIAGNOSIS — Z1159 Encounter for screening for other viral diseases: Secondary | ICD-10-CM

## 2021-06-13 DIAGNOSIS — N200 Calculus of kidney: Secondary | ICD-10-CM

## 2021-06-13 NOTE — Progress Notes (Signed)
?[facility] ?[facility] ?[place] ?[place] [address] ?Dept: [phone] ?Dept Fax: [phone] ? ?Transfer of Care Office Visit ? ?Subjective:  ? ? Patient ID: Caitlin Duarte, female    DOB: [birth year], 36 y.o..   MRN: [id number] ? ?Chief Complaint  ?Patient presents with  ? Establish Care  ?  TOC- establish care.  C/o having stomach issues .    ? ? ?History of Present Illness: ? ?Patient is in today to establish care. Ms. Nevers was born in [place], [place]. Her family moved to [facility] when she was 45 months old. she attended college at [employer], Chemical engineer in Aeronautical engineer. She is currently a Licensed conveyancer at [employer]. She has been married (husband, Aaron Edelman) for 9 years. They have two children (6,4). She denies tobacco or drug use. She drinks about twice a week. ? ?Ms. Kuehnle has a history of a nonspecific colitis that was discovered this winter. She underwent CT scan and colonoscopy. She was treated with antibiotics and steroids. She has been on a long taper of budesonide. She notes when her dosage dropped to 1 mg, she had a flare of symptoms. She was boosted back up, and now is back down ot 1 mg with hopes of stopping the medication. She still has episodes of sudden bowel urgency and cramping. She takes Bentyl for the cramps. ? ?Ms. Bowery has a history of a retinal nevus. This has caused some fluid leak under the retina periodically. She is followed by her ophthalmologist and a two retinal specialists (one at [facility]). ? ?Ms. Wolfrey has a history of chronic migraines. Currently these occur about 2 times a month. She feels she does respond well to Maxalt. ? ?Ms. Cosio has a history of recurrent sinus issues and allergic rhinitis. She is currently managed on Xyzal and Flonase nasal spray. ? ?Past Medical History: ?Patient Active Problem List  ? Diagnosis Date Noted  ? Chronic nonspecific colitis 06/13/2021  ? Other specified retinal disorders 10/20/2019   ? Chronic migraine without aura without status migrainosus, not intractable 03/05/2018  ? Allergic rhinitis 09/07/2013  ? Atypical nevus 09/07/2013  ? ?Past Surgical History:  ?Procedure Laterality Date  ? APPENDECTOMY  2008  ? EYE SURGERY  1997  ? NOSE SURGERY  2006  ? RETINAL LASER PROCEDURE    ? SINUS SURGERY WITH INSTATRAK    ? TONSILLECTOMY  2020  ? WISDOM TOOTH EXTRACTION  2006  ? ?Family History  ?Problem Relation Age of Onset  ? Hypertension Father   ? Diabetes Father   ? Diverticulitis Father   ? Anemia Sister   ? Thyroid disease Sister   ? Headache Sister   ?     ?migraines  ? Diverticulitis Sister   ? Colon polyps Sister   ? Alzheimer's disease Maternal Grandfather   ? Stroke Maternal Grandfather   ? Diabetes Paternal Grandmother   ? Hypertension Paternal Grandmother   ? Heart disease Paternal Grandmother   ? Heart attack Paternal Grandmother   ? Diabetes Paternal Grandfather   ? Hypertension Paternal Grandfather   ? Colon cancer Neg Hx   ? Pancreatic cancer Neg Hx   ? Esophageal cancer Neg Hx   ? Liver cancer Neg Hx   ? Stomach cancer Neg Hx   ? ?Outpatient Medications Prior to Visit  ?Medication Sig Dispense Refill  ? budesonide (ENTOCORT EC) 3 MG 24 hr capsule Take 2 capsules (6 mg total) by mouth daily for 28 days, THEN  1 capsule (3 mg total) daily for 14 days. 70 capsule 0  ? dicyclomine (BENTYL) 10 MG capsule Take 1 capsule (10 mg total) by mouth every 8 (eight) hours as needed for spasms. 90 capsule 3  ? fluticasone (FLONASE) 50 MCG/ACT nasal spray fluticasone propionate 50 mcg/actuation nasal spray,suspension ? SHAKE LQ AND U 2 SPRAYS IEN D    ? levocetirizine (XYZAL) 5 MG tablet Take 5 mg by mouth every evening.    ? RESTASIS 0.05 % ophthalmic emulsion 1 drop 2 (two) times daily.    ? rizatriptan (MAXALT-MLT) 10 MG disintegrating tablet Take 1 tablet (10 mg total) by mouth as needed for migraine. May repeat in 2 hours if needed. (max 2/24hrs). 10 tablet 5  ? cetirizine (ZYRTEC) 10 MG tablet Take  1 tablet (10 mg total) by mouth daily. (Patient not taking: Reported on 06/13/2021) 90 tablet 3  ? mupirocin ointment (BACTROBAN) 2 % Apply 1 application topically. 2-3 times daily    ? ?No facility-administered medications prior to visit.  ? ?Allergies  ?Allergen Reactions  ? Other Anaphylaxis and Shortness Of Breath  ?  curry  ? ?Objective:  ? ?Today's Vitals  ? 06/13/21 1505  ?BP: 120/72  ?Pulse: 94  ?Temp: (!) 97.4 ?F (36.3 ?C)  ?TempSrc: Temporal  ?SpO2: 95%  ?Weight: 136 lb 6.4 oz (61.9 kg)  ?Height: 5' 4.75" (1.645 m)  ? ?Body mass index is 22.87 kg/m?.  ? ?General: Well developed, well nourished. No acute distress. ?Psych: Alert and oriented. Normal mood and affect. ? ?Health Maintenance Due  ?Topic Date Due  ? Hepatitis C Screening  Never done  ? ?Imaging: ?CT of Abdomen and Pelvis w contrast (02/17/2021) ?IMPRESSION: ?Question of irregular thickening of the rectum with adjacent abnormal number of lymph nodes in the mesorectum. Infectious or inflammatory proctitis would be the most common etiology in a patient of this age. Given irregularity of the mucosa that is suggested and the surrounding lymph nodes would suggest follow-up direct visualization to exclude mucosal lesions. ?  ?Mild hepatic steatosis. ?  ?Nephrolithiasis in the lower pole of the RIGHT kidney. ? ?Lab Results ?Last CBC ?Lab Results  ?Component Value Date  ? WBC 19.8 Repeated and verified X2. ([facility]) 02/15/2021  ? HGB 13.7 02/15/2021  ? HCT 41.4 02/15/2021  ? MCV 89.2 02/15/2021  ? MCH 30.1 02/26/2018  ? RDW 13.5 02/15/2021  ? PLT 386.0 02/15/2021  ? ?Last metabolic panel ?Lab Results  ?Component Value Date  ? GLUCOSE 76 02/15/2021  ? NA 137 02/15/2021  ? K 3.9 02/15/2021  ? CL 103 02/15/2021  ? CO2 25 02/15/2021  ? BUN 13 02/15/2021  ? CREATININE 0.67 02/15/2021  ? GFRNONAA 118 02/26/2018  ? CALCIUM 9.7 02/15/2021  ? PROT 6.8 02/15/2021  ? ALBUMIN 4.8 02/15/2021  ? LABGLOB 1.8 02/26/2018  ? AGRATIO 2.7 (H) 02/26/2018  ? BILITOT 0.5 02/15/2021  ?  ALKPHOS 62 02/15/2021  ? AST 19 02/15/2021  ? ALT 22 02/15/2021  ? ?Last lipids ?Lab Results  ?Component Value Date  ? CHOL 187 08/19/2019  ? HDL 45.30 08/19/2019  ? LDLCALC 126 (H) 08/19/2019  ? TRIG 79.0 08/19/2019  ? CHOLHDL 4 08/19/2019  ? ?Assessment & Plan:  ? ?1. Chronic nonspecific colitis ?Currently tapering down on budesonide. Down to 1 mg daily. She will continue to follow with GI. We did discuss whether she may need some other antiinflammatory int he long run to help manage her colitis. She will discuss further with  her GI doctor. ? ?- CBC ? ?2. Other specified retinal disorders ?History of a retinal pigmented nevus. Following with her retinal specialist. ? ?3. Chronic migraine without aura without status migrainosus, not intractable ?Stable on PRN Maxalt 10 mg. ? ?4. Seasonal allergic rhinitis due to pollen ?Stable on Xyzal and Flonase. ? ?5. Encounter for hepatitis C screening test for low risk patient ? ?- HCV Ab w Reflex to Quant PCR ? ?6. Hepatic steatosis ?Noted on CT scan. No elevated transaminase levels on recent blood tests. ? ?7. Right nephrolithiasis ?Noted on CT scan.Will follow. ? ? ?Return in about 1 year (around 06/14/2022) for Reassessment.  ? ?Haydee Salter, MD ?

## 2021-06-14 LAB — CBC
HCT: 38.3 % (ref 36.0–46.0)
Hemoglobin: 12.9 g/dL (ref 12.0–15.0)
MCHC: 33.8 g/dL (ref 30.0–36.0)
MCV: 90.8 fl (ref 78.0–100.0)
Platelets: 341 10*3/uL (ref 150.0–400.0)
RBC: 4.22 Mil/uL (ref 3.87–5.11)
RDW: 13.1 % (ref 11.5–15.5)
WBC: 10.2 10*3/uL (ref 4.0–10.5)

## 2021-06-15 LAB — HCV AB W REFLEX TO QUANT PCR: HCV Ab: NONREACTIVE

## 2021-06-15 LAB — HCV INTERPRETATION

## 2021-07-18 ENCOUNTER — Ambulatory Visit (INDEPENDENT_AMBULATORY_CARE_PROVIDER_SITE_OTHER): Payer: 59

## 2021-07-18 ENCOUNTER — Encounter: Payer: Self-pay | Admitting: Family Medicine

## 2021-07-18 ENCOUNTER — Ambulatory Visit: Payer: 59 | Admitting: Family Medicine

## 2021-07-18 VITALS — BP 124/72 | HR 106 | Temp 98.7°F | Ht 64.0 in | Wt 141.6 lb

## 2021-07-18 DIAGNOSIS — G43709 Chronic migraine without aura, not intractable, without status migrainosus: Secondary | ICD-10-CM | POA: Diagnosis not present

## 2021-07-18 DIAGNOSIS — S46811A Strain of other muscles, fascia and tendons at shoulder and upper arm level, right arm, initial encounter: Secondary | ICD-10-CM | POA: Diagnosis not present

## 2021-07-18 DIAGNOSIS — S161XXA Strain of muscle, fascia and tendon at neck level, initial encounter: Secondary | ICD-10-CM

## 2021-07-18 MED ORDER — KETOROLAC TROMETHAMINE 60 MG/2ML IM SOLN
60.0000 mg | Freq: Once | INTRAMUSCULAR | Status: AC
  Administered 2021-07-18: 60 mg via INTRAMUSCULAR

## 2021-07-18 MED ORDER — RIZATRIPTAN BENZOATE 10 MG PO TBDP: 10.0000 mg | ORAL_TABLET | ORAL | 5 refills | Status: DC | PRN

## 2021-07-18 MED ORDER — CYCLOBENZAPRINE HCL 10 MG PO TABS: 10.0000 mg | ORAL_TABLET | Freq: Every day | ORAL | 0 refills | Status: DC

## 2021-07-18 MED ORDER — METHOCARBAMOL 500 MG PO TABS: ORAL_TABLET | ORAL | 0 refills | Status: DC

## 2021-07-18 MED ORDER — PREDNISONE 20 MG PO TABS
20.0000 mg | ORAL_TABLET | Freq: Two times a day (BID) | ORAL | 0 refills | Status: AC
Start: 2021-07-18 — End: 2021-07-25

## 2021-07-18 NOTE — Progress Notes (Signed)
? ?Established Patient Office Visit ? ?Subjective   ?Patient ID: Caitlin Duarte, female    DOB: 14-Apr-1985  Age: 36 y.o. MRN: [id number] ? ?Chief Complaint  ?Patient presents with  ? Muscle Pain  ?  Possible pulled muscle on left side of neck and back very painful x 4 days unable to turn head   ? ? ?Muscle Pain ?Pertinent negatives include no abdominal pain, chest pain or weakness.  for evaluation and treatment of severe neck and upper back stiffness and pain.  Denies any recent traumatic illness.  Was dancing a few days before onset.  Past medical history of trapezius and cervical strains.  She has tried physical therapy in the past for this. ? ? ? ?Review of Systems  ?Constitutional:  Negative for chills, diaphoresis, malaise/fatigue and weight loss.  ?HENT: Negative.    ?Eyes: Negative.  Negative for blurred vision and double vision.  ?Cardiovascular:  Negative for chest pain.  ?Gastrointestinal:  Negative for abdominal pain.  ?Genitourinary: Negative.   ?Musculoskeletal:  Positive for myalgias and neck pain. Negative for falls.  ?Neurological:  Negative for tingling, speech change, loss of consciousness and weakness.  ?Psychiatric/Behavioral: Negative.    ? ?  ?Objective:  ?  ? ?BP 124/72 (BP Location: Right Arm, Patient Position: Sitting, Cuff Size: Normal)   Pulse (!) 106   Temp 98.7 ?F (37.1 ?C) (Temporal)   Ht '5\' 4"'$  (1.626 m)   Wt 141 lb 9.6 oz (64.2 kg)   SpO2 99%   BMI 24.31 kg/m?  ? ? ?Physical Exam ?Constitutional:   ?   General: She is not in acute distress. ?   Appearance: Normal appearance. She is well-developed. She is not diaphoretic.  ?HENT:  ?   Head: Normocephalic and atraumatic.  ?   Right Ear: External ear normal.  ?   Left Ear: External ear normal.  ?   Nose: Nose normal.  ?Eyes:  ?   General: No scleral icterus.    ?   Right eye: No discharge.     ?   Left eye: No discharge.  ?   Conjunctiva/sclera: Conjunctivae normal.  ?Neck:  ?   Thyroid: No thyromegaly.  ?   Vascular: No JVD.  ?    Trachea: No tracheal deviation.  ?Cardiovascular:  ?   Heart sounds: Normal heart sounds. No murmur heard. ?  No friction rub. No gallop.  ?Pulmonary:  ?   Effort: Pulmonary effort is normal.  ?Abdominal:  ?   General: Bowel sounds are normal.  ?Musculoskeletal:     ?   General: No deformity.  ?   Cervical back: Rigidity, spasms and tenderness present. Decreased range of motion.  ?     Back: ? ?Lymphadenopathy:  ?   Cervical: No cervical adenopathy.  ?Skin: ?   General: Skin is warm and dry.  ?   Coloration: Skin is not pale.  ?   Findings: No erythema or rash.  ?Neurological:  ?   Mental Status: She is alert and oriented to person, place, and time.  ?   Cranial Nerves: No cranial nerve deficit.  ?   Motor: No abnormal muscle tone.  ?   Coordination: Coordination normal.  ?   Deep Tendon Reflexes: Reflexes normal.  ?Psychiatric:     ?   Behavior: Behavior normal.     ?   Judgment: Judgment normal.  ? ? ? ?No results found for any visits on 07/18/21. ? ? ? ?The ASCVD  Risk score (Arnett DK, et al., 2019) failed to calculate for the following reasons: ?  The 2019 ASCVD risk score is only valid for ages 40 to 59 ? ?  ?Assessment & Plan:  ? ?Problem List Items Addressed This Visit   ? ?  ? Cardiovascular and Mediastinum  ? Chronic migraine without aura without status migrainosus, not intractable  ? Relevant Medications  ? rizatriptan (MAXALT-MLT) 10 MG disintegrating tablet  ? methocarbamol (ROBAXIN) 500 MG tablet  ? cyclobenzaprine (FLEXERIL) 10 MG tablet  ? ketorolac (TORADOL) injection 60 mg  ? predniSONE (DELTASONE) 20 MG tablet  ?  ? Musculoskeletal and Integument  ? Trapezius strain, right, initial encounter - Primary  ? Relevant Medications  ? methocarbamol (ROBAXIN) 500 MG tablet  ? cyclobenzaprine (FLEXERIL) 10 MG tablet  ? ketorolac (TORADOL) injection 60 mg  ? predniSONE (DELTASONE) 20 MG tablet  ? Cervical strain  ? Relevant Medications  ? methocarbamol (ROBAXIN) 500 MG tablet  ? cyclobenzaprine (FLEXERIL) 10  MG tablet  ? ketorolac (TORADOL) injection 60 mg  ? predniSONE (DELTASONE) 20 MG tablet  ? Other Relevant Orders  ? DG Cervical Spine Complete  ? ? ?No follow-ups on file.  ? ? ?Libby Maw, MD ? ?

## 2021-08-28 NOTE — Progress Notes (Signed)
Caitlin [facility]arte Note  09/07/2021     CHIEF COMPLAINT Patient presents for Retina Follow Up  HISTORY OF PRESENT ILLNESS: Caitlin Duarte is a 36 y.o. female who presents to the [facility] today for:   HPI     Retina Follow Up   Patient presents with  Other.  In both eyes.  Duration of 11 months.  I, the attending physician,  performed the HPI with the patient and updated documentation appropriately.        Comments   Patient seen by Dr. Daralene Duarte 08/16/2021.  She states he said when looking at the right eye she needed to come back to Dr. Coralyn Duarte maybe thinning.  She seen Dr. Katy Duarte for a glasses and Cls update.  He told her she may have thinning as well.        Last edited by Caitlin Caffey, MD on 09/08/2021  1:23 AM.     Patient saw Dr. Daralene Duarte on June 7 who told her she saw some thinning, she also saw Caitlin Duarte at Dr. Patrici Duarte office last week who also told her he saw some thinning  Referring physician: [name], PA-C [facility] [address] [place],  [place] [id number]  HISTORICAL INFORMATION:   Selected notes from the MEDICAL RECORD NUMBER Referred by Caitlin Duarte concern of inferior retinal breaks OS   CURRENT MEDICATIONS: Current Outpatient Medications (Ophthalmic Drugs)  Medication Sig   RESTASIS 0.05 % ophthalmic emulsion 1 drop 2 (two) times daily.   No current facility-administered medications for this visit. (Ophthalmic Drugs)   Current Outpatient Medications (Other)  Medication Sig   Carbinoxamine Maleate 4 MG TABS Take 1 tablet (4 mg total) by mouth every 8 (eight) hours as needed.   cyclobenzaprine (FLEXERIL) 10 MG tablet Take 1 tablet (10 mg total) by mouth at bedtime. As needed.   dicyclomine (BENTYL) 10 MG capsule Take 1 capsule (10 mg total) by mouth every 8 (eight) hours as needed for spasms.   fluticasone (FLONASE) 50 MCG/ACT nasal spray fluticasone propionate 50 mcg/actuation nasal spray,suspension  SHAKE LQ AND U 2 SPRAYS IEN D    Fluticasone Propionate (XHANCE) 93 MCG/ACT EXHU Place 1-2 sprays into the nose 2 (two) times daily.   levocetirizine (XYZAL) 5 MG tablet Take 5 mg by mouth every evening.   methocarbamol (ROBAXIN) 500 MG tablet May take one in morning and again in mid afternoon as needed.   rizatriptan (MAXALT-MLT) 10 MG disintegrating tablet Take 1 tablet (10 mg total) by mouth as needed for migraine. May repeat in 2 hours if needed. (max 2/24hrs).   No current facility-administered medications for this visit. (Other)   REVIEW OF SYSTEMS: ROS   Positive for: Gastrointestinal, Eyes Negative for: Constitutional, Neurological, Skin, Genitourinary, Musculoskeletal, HENT, Endocrine, Cardiovascular, Respiratory, Psychiatric, Allergic/Imm, Heme/Lymph Last edited by Leonie Duarte, COA on 09/07/2021  9:04 AM.     ALLERGIES Allergies  Allergen Reactions   Other Anaphylaxis and Shortness Of Breath    curry   PAST MEDICAL HISTORY Past Medical History:  Diagnosis Date   Allergy    Asthma    ?allergy induced asthma   Migraines    Seasonal allergies    Past Surgical History:  Procedure Laterality Date   APPENDECTOMY  2008   EYE MUSCLE SURGERY  1997   NOSE SURGERY  2006   RETINAL LASER PROCEDURE     SINUS SURGERY WITH INSTATRAK     TONSILLECTOMY  2020   WISDOM TOOTH EXTRACTION  2006   FAMILY HISTORY Family History  Problem Relation Age of Onset   Hypertension Father    Diabetes Father    Diverticulitis Father    Anemia Sister    Thyroid disease Sister    Headache Sister        ?migraines   Diverticulitis Sister    Colon polyps Sister    Alzheimer's disease Maternal Grandfather    Stroke Maternal Grandfather    Diabetes Paternal Grandmother    Hypertension Paternal Grandmother    Heart disease Paternal Grandmother    Heart attack Paternal Grandmother    Diabetes Paternal Grandfather    Hypertension Paternal Grandfather    Colon cancer Neg Hx    Pancreatic cancer Neg Hx    Esophageal cancer  Neg Hx    Liver cancer Neg Hx    Stomach cancer Neg Hx    SOCIAL HISTORY Social History   Tobacco Use   Smoking status: Never   Smokeless tobacco: Never  Vaping Use   Vaping Use: Never used  Substance Use Topics   Alcohol use: Never    Comment: socially   Drug use: No       OPHTHALMIC EXAM:  Base Eye Exam     Visual Acuity (Snellen - Linear)       Right Left   Dist cc 20/20- 20/25-   Dist ph cc  20/25+    Correction: Glasses  Dr. Katy Duarte told patient her astigmatism has worsened OS.  She has not updated glasses Rx yet and is trying different Cls.         Tonometry (Tonopen, 9:11 AM)       Right Left   Pressure 13 13         Pupils       Dark Light Shape React APD   Right 3 2 Round Brisk None   Left 3 2 Round Brisk None         Visual Fields (Counting fingers)       Left Right    Full Full         Extraocular Movement       Right Left    Full Full         Neuro/Psych     Oriented x3: Yes   Mood/Affect: Normal         Dilation     Both eyes: 1.0% Mydriacyl, 2.5% Phenylephrine @ 9:11 AM           Slit Lamp and Fundus Exam     Slit Lamp Exam       Right Left   Lids/Lashes Normal Normal   Conjunctiva/Sclera White and quiet White and quiet   Cornea Clear Clear   Anterior Chamber Deep and quiet Deep and quiet   Iris Round and dilated Round and dilated   Lens Clear Clear   Anterior Vitreous trace vitreous syneresis trace vitreous syneresis         Fundus Exam       Right Left   Disc Pink and sharp, Compact Pink and sharp   C/D Ratio 0.3 0.3   Macula Flat, Good foveal reflex, No heme or edema Flat, Good foveal reflex; pigmented subfoveal choroidal nevus (approx 1DD) with stable resolution in focal SRF overlying   Vessels Normal mild attenuation   Periphery Attached, Multiple patches of lattice from 1100-0100, patch of lattice from 0500-0730 -- good laser surrounding all lesions; no new RT/RD or lattice Attached, multiple  patches of  lattice from 11:00-0300, pigmented lattice at 4:30, 5:00, and 6:00 with atrophic holes - good laser changes surrounding all lesions; no new RT/RD or lattice           Refraction     Wearing Rx       Sphere Cylinder Axis   Right -7.75 +2.25 012   Left -9.00 +4.00 171            IMAGING AND PROCEDURES  Imaging and Procedures for '@TODAY'$ @  OCT, Retina - OU - Both Eyes       Right Eye Quality was good. Central Foveal Thickness: 280. Progression has been stable. Findings include normal foveal contour, no IRF, no SRF, vitreomacular adhesion .   Left Eye Quality was good. Central Foveal Thickness: 331. Progression has been stable. Findings include normal foveal contour, no IRF, no SRF, retinal drusen , vitreomacular adhesion (subfoveal choroidal hyper-reflective lesion with elevation and overlying drusen-- stable; stable resolution of SRF overlying lesion).   Notes *Images captured and stored on drive  Diagnosis / Impression:  NFP; no IRF/SRF OU OS: subfoveal choroidal hyper-reflective lesion with elevation and overlying drusen-- stable; stable resolution of SRF overlying lesion  Clinical management:  See below  Abbreviations: NFP - Normal foveal profile. CME - cystoid macular edema. PED - pigment epithelial detachment. IRF - intraretinal fluid. SRF - subretinal fluid. EZ - ellipsoid zone. ERM - epiretinal membrane. ORA - outer retinal atrophy. ORT - outer retinal tubulation. SRHM - subretinal hyper-reflective material            ASSESSMENT/PLAN:    ICD-10-CM   1. Lattice degeneration of both retinas  H35.413 OCT, Retina - OU - Both Eyes    2. Retinal hole of both eyes  H33.323     3. Choroidal nevus of left eye  D31.32 OCT, Retina - OU - Both Eyes    4. Serous retinal detachment of left eye  H33.22     5. Myopia of both eyes with astigmatism  H52.13    H52.203     6. History of strabismus surgery  Z98.890      1,2. Lattice degeneration w/  atrophic holes, both eyes  - OD: patches of lattice from 1130-12 and at 0730  - OS: patches of lattice from 11-130 and 430-6   - s/p laser retinopexy OS (07.31.20) - good laser changes surrounding all lesions  - s/p laser retinopexy OD (08.07.20) - good laser surrounding all lesions - new development of lattice degeneration at 1245 and 0200-0300 -- s/p laser retinopexy (06.17.22) -- good laser changes in place  - no new RT/RD or lattice  - f/u 1 year, DFE, OCT  3,4. Choroidal nevus OS with history of +SRF/focal serous RD  - relatively flat, pigmented, subfoveal nevus  - ~1DD in size  - no orange pigment or drusen  - focal SRF noted on exam 8.6.21 overlying nevus --stably resolved today  - now under the expert management of Dr. Daralene Duarte, who has treated w/ IVA x3 (08.12.21, 09.15.21, 10.19.21)  - OCT shows subfoveal hyperreflective choroidal lesion with stable improvement in SRF/edema -- SRF stably resolved  5. High myopia w/ astigmatism OU  - discussed association of high myopia with lattice degeneration and increased risk of RT/RD  - RD warnings given  - Patient has family history of high myopia (Mother and maternal Grandmother--Grandmother has history of RD and Fuchs Dystrophy--history of cornea transplant )  6.  Hx of strabismus surgery  - Hx of muscle surgery  at age 57 with Dr. Elliot Dally (Vision 20/20-)   Ophthalmic Meds Ordered this visit:  No orders of the defined types were placed in this encounter.    Return in about 1 year (around 09/08/2022) for f/u lattice degeneration OU, DFE, OCT.  There are no Patient Instructions on file for this visit.  Explained the diagnoses, plan, and follow up with the patient and they expressed understanding.  Patient expressed understanding of the importance of proper follow up care.   This document serves as a record of services personally performed by Gardiner Sleeper, MD, PhD. It was created on their behalf by San Jetty. Owens Shark, OA an ophthalmic  technician. The creation of this record is the provider's dictation and/or activities during the visit.    Electronically signed by: San Jetty. Owens Shark, [place] 06.19.2023 1:24 AM  Gardiner Sleeper, M.D., Ph.D. Diseases & Surgery of the Retina and Vitreous [place]  I have reviewed the above documentation for accuracy and completeness, and I agree with the above. Gardiner Sleeper, M.D., Ph.D. 09/08/21 1:28 AM  Abbreviations: M myopia (nearsighted); A astigmatism; H hyperopia (farsighted); P presbyopia; Mrx spectacle prescription;  CTL contact lenses; OD right eye; OS left eye; OU both eyes  XT exotropia; ET esotropia; PEK punctate epithelial keratitis; PEE punctate epithelial erosions; DES dry eye syndrome; MGD meibomian gland dysfunction; ATs artificial tears; PFAT's preservative free artificial tears; [place] nuclear sclerotic cataract; PSC posterior subcapsular cataract; ERM epi-retinal membrane; PVD posterior vitreous detachment; RD retinal detachment; DM diabetes mellitus; DR diabetic retinopathy; NPDR non-proliferative diabetic retinopathy; PDR proliferative diabetic retinopathy; CSME clinically significant macular edema; DME diabetic macular edema; dbh dot blot hemorrhages; CWS cotton wool spot; POAG primary open angle glaucoma; C/D cup-to-disc ratio; HVF humphrey visual field; GVF goldmann visual field; OCT optical coherence tomography; IOP intraocular pressure; BRVO Branch retinal vein occlusion; CRVO central retinal vein occlusion; CRAO central retinal artery occlusion; BRAO branch retinal artery occlusion; RT retinal tear; SB scleral buckle; PPV pars plana vitrectomy; VH Vitreous hemorrhage; PRP panretinal laser photocoagulation; IVK intravitreal kenalog; VMT vitreomacular traction; MH Macular hole;  NVD neovascularization of the disc; NVE neovascularization elsewhere; AREDS age related eye disease study; ARMD age related macular degeneration; POAG primary open angle glaucoma; EBMD  epithelial/anterior basement membrane dystrophy; ACIOL anterior chamber intraocular lens; IOL intraocular lens; PCIOL posterior chamber intraocular lens; Phaco/IOL phacoemulsification with intraocular lens placement; [place] photorefractive keratectomy; LASIK laser assisted in situ keratomileusis; HTN hypertension; DM diabetes mellitus; COPD chronic obstructive pulmonary disease

## 2021-08-29 ENCOUNTER — Ambulatory Visit (INDEPENDENT_AMBULATORY_CARE_PROVIDER_SITE_OTHER): Payer: 59 | Admitting: Allergy & Immunology

## 2021-08-29 ENCOUNTER — Encounter: Payer: Self-pay | Admitting: Allergy & Immunology

## 2021-08-29 VITALS — BP 118/74 | HR 116 | Temp 98.5°F | Resp 16 | Ht 64.5 in | Wt 136.4 lb

## 2021-08-29 DIAGNOSIS — R053 Chronic cough: Secondary | ICD-10-CM | POA: Diagnosis not present

## 2021-08-29 DIAGNOSIS — J310 Chronic rhinitis: Secondary | ICD-10-CM

## 2021-08-29 DIAGNOSIS — J329 Chronic sinusitis, unspecified: Secondary | ICD-10-CM

## 2021-08-29 MED ORDER — CARBINOXAMINE MALEATE 4 MG PO TABS: 4.0000 mg | ORAL_TABLET | Freq: Three times a day (TID) | ORAL | 5 refills | Status: DC | PRN

## 2021-08-29 MED ORDER — XHANCE 93 MCG/ACT NA EXHU: 1.0000 | INHALANT_SUSPENSION | Freq: Two times a day (BID) | NASAL | 5 refills | Status: DC

## 2021-08-29 NOTE — Progress Notes (Signed)
NEW PATIENT  Date of Service/Encounter:  08/29/21  Consult requested by: Haydee Salter, MD   Assessment:   Chronic rhinitis - with testing only positive to ragweed  Chronic recurrent sinusitis - consider immune workup (ordering sinus CT)   Chronic cough - with normal spirometry  [name] (quite devoted)  Plan/Recommendations:   1. Chronic rhinitis - Testing today showed: ragweed - Copy of test results provided.  - Avoidance measures provided. - I truly expected more from your testing. - I do not think that blood work for environmental allergies would be enlightening if the intradermals were not reactive at all.  - This may be what is known as "local allergic rhinitis", which occurs when your immune system makes localized IgE (allergy antibodies) to various allergens (as opposed to systemic) IgE. - Therefore, since IgE is not make systemically, allergic sensitizations are not picked up on routine skin testing or blood testing. - There are specialized academic research centers that can test for IgE in the nasal cavity, but this is not done in the clinical setting at this time.  - It can still be treated with nasal sprays and antihistamines.  - Stop taking: Xyzal and Flonase - Start taking: Xhance (fluticasone) 1-2 sprays per nostril twice daily and carbinoxamine 4 mg every 8 hours as needed (can cause sleepiness, but it is good at drying up the nasal passages and typically the sleepiness improves over time).  - You can use an extra dose of the antihistamine, if needed, for breakthrough symptoms.  - Consider nasal saline rinses 1-2 times daily to remove allergens from the nasal cavities as well as help with mucous clearance (this is especially helpful to do before the nasal sprays are given) - I do not think that allergy shots would be useful at this point in time.   2. Chronic recurrent sinusitis - Consider immune workup in the future?  - With your infections only affecting  one area (ie sinus infections), this makes an immunodeficiency less likely.   3. Chronic cough - Spirometry was normal. - We are not going to maximize your rhinitis control instead of starting any controller medication for presumed asthma. - I still think that this is all related to chronic postnasal drip.  4. Return in about 4 weeks (around 09/26/2021). We need to see Caitlin Duarte again and get his asthma under better control. I wonder if he needs to be on an injectable asthma medication such as Dupixent or Nucala?    This note in its entirety was forwarded to the Provider who requested this consultation.  Subjective:   Caitlin Duarte is a 36 y.o. female presenting today for evaluation of  Chief Complaint  Patient presents with   Establish Care   Allergies    Possible to dogs?   Cough   Nasal Congestion    Caitlin Duarte has a history of the following: Patient Active Problem List   Diagnosis Date Noted   Trapezius strain, right, initial encounter 07/18/2021   Cervical strain 07/18/2021   Chronic nonspecific colitis 06/13/2021   Hepatic steatosis 06/13/2021   Right nephrolithiasis 06/13/2021   Other specified retinal disorders 10/20/2019   Chronic migraine without aura without status migrainosus, not intractable 03/05/2018   Allergic rhinitis 09/07/2013   Atypical nevus 09/07/2013    History obtained from: chart review and patient.  Reece Levy was referred by Haydee Salter, MD.     Caitlin Duarte is a 36 y.o. female presenting for an evaluation  of environmental allergies and cough . She thinks that her symptoms might be triuggered by the bog they recently got. It is a husky mix who eats a lot. The dog is 29 months old.    Asthma/Respiratory Symptom History: She did have wheezing and SOB at one point. She was diagnosed with allergy induced asthma. This is nothing like her son Caitlin Duarte. The inhaler expired and she hardly ever used it. She has never been on a controller medication to her  knowledge.   Allergic Rhinitis Symptom History:  She has mucous constantly. She thought that she was getting better. She reports that she got Invasalign and this caused some sinus pressure. She was treated with one week of antibiotics or so. She is not sure what she was treated with amoxicillin, although she is not entirely sure. She did have sinus surgery years ago and gets FEWER sinus infections but still gets them.   Dog entered the life in late January. She was having issues before this howeve.r   She takes Xyzal and Flonase. She also took Sudafed; this did not work at all. She did use Afrin for 3 days. This helps her to breathe but she only uses for 3 days. She gets sick every spring and every fall.   She did have some Eustachian tube dysfunction and used Afrin and nasal steroids consistently. Sometimes when she blows her nose hard, she has clearing of the symptoms. She sees Dr. Redmond Baseman. She did have a tonsillectomy in 2020. She had her sinus surgery in 2006 or so with Dr. Ernesto Rutherford.   She was also on a steroid taper in May for a shoulder injury sustained at a [employer]. She said that she was dancing like she was in her 11s. She went to [place] to hear her with a girlfriend of hers. 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   GERD Symptom History: She apparently had blood stools and constant diarrhea in the fall. She was having a lot of abdominal pain. There was a lot of inflammation and ulcerative colitis was discussed. She took a couple of months of budeonside  and felt much better. This also helped her sinuses and her shoulder pain.   Otherwise, there is no history of other atopic diseases, including drug allergies, stinging insect allergies, eczema, urticaria, or contact dermatitis. There is no significant infectious history. Vaccinations are up to date.    Past Medical History: Patient Active Problem List   Diagnosis Date Noted   Trapezius strain, right, initial  encounter 07/18/2021   Cervical strain 07/18/2021   Chronic nonspecific colitis 06/13/2021   Hepatic steatosis 06/13/2021   Right nephrolithiasis 06/13/2021   Other specified retinal disorders 10/20/2019   Chronic migraine without aura without status migrainosus, not intractable 03/05/2018   Allergic rhinitis 09/07/2013   Atypical nevus 09/07/2013    Medication List:  Allergies as of 08/29/2021       Reactions   Other Anaphylaxis, Shortness Of Breath   curry        Medication List        Accurate as of August 29, 2021  1:07 PM. If you have any questions, ask your nurse or doctor.          Carbinoxamine Maleate 4 MG Tabs Take 1 tablet (4 mg total) by mouth every 8 (eight) hours as needed. Started by: Valentina Shaggy, MD   cyclobenzaprine 10 MG tablet Commonly known as: FLEXERIL Take 1 tablet (10  mg total) by mouth at bedtime. As needed.   dicyclomine 10 MG capsule Commonly known as: BENTYL Take 1 capsule (10 mg total) by mouth every 8 (eight) hours as needed for spasms.   fluticasone 50 MCG/ACT nasal spray Commonly known as: FLONASE fluticasone propionate 50 mcg/actuation nasal spray,suspension  SHAKE LQ AND U 2 SPRAYS IEN D What changed: Another medication with the same name was added. Make sure you understand how and when to take each. Changed by: Valentina Shaggy, MD   Cyril Loosen MCG/ACT Exhu Generic drug: Fluticasone Propionate Place 1-2 sprays into the nose 2 (two) times daily. What changed: You were already taking a medication with the same name, and this prescription was added. Make sure you understand how and when to take each. Changed by: Valentina Shaggy, MD   levocetirizine 5 MG tablet Commonly known as: XYZAL Take 5 mg by mouth every evening.   methocarbamol 500 MG tablet Commonly known as: ROBAXIN May take one in morning and again in mid afternoon as needed.   Restasis 0.05 % ophthalmic emulsion Generic drug: cycloSPORINE 1 drop 2  (two) times daily. What changed: Another medication with the same name was removed. Continue taking this medication, and follow the directions you see here. Changed by: Valentina Shaggy, MD   rizatriptan 10 MG disintegrating tablet Commonly known as: MAXALT-MLT Take 1 tablet (10 mg total) by mouth as needed for migraine. May repeat in 2 hours if needed. (max 2/24hrs).        Birth History: non-contributory  Developmental History: non-contributory  Past Surgical History: Past Surgical History:  Procedure Laterality Date   APPENDECTOMY  2008   [place]   NOSE SURGERY  2006   RETINAL LASER PROCEDURE     SINUS SURGERY WITH INSTATRAK     TONSILLECTOMY  2020   WISDOM TOOTH EXTRACTION  2006     Family History: Family History  Problem Relation Age of Onset   Hypertension Father    Diabetes Father    Diverticulitis Father    Anemia Sister    Thyroid disease Sister    Headache Sister        ?migraines   Diverticulitis Sister    Colon polyps Sister    Alzheimer's disease Maternal Grandfather    Stroke Maternal Grandfather    Diabetes Paternal Grandmother    Hypertension Paternal Grandmother    Heart disease Paternal Grandmother    Heart attack Paternal Grandmother    Diabetes Paternal Grandfather    Hypertension Paternal Grandfather    Colon cancer Neg Hx    Pancreatic cancer Neg Hx    Esophageal cancer Neg Hx    Liver cancer Neg Hx    Stomach cancer Neg Hx      Social History: Briel lives at home with her family. 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 There is mold in [employer] that was fixed, but there is a lot of humidity in [employer] treated with two humidifiers. She has issues even when she is not at school working.    Review of Systems  Constitutional: Negative.  Negative for chills, fever, malaise/fatigue and weight loss.  HENT:  Positive for congestion and sinus pain. Negative for ear discharge and ear pain.    Eyes:  Negative for pain, discharge and redness.  Respiratory:  Negative for cough, sputum production, shortness of breath, wheezing and stridor.   Cardiovascular: Negative.  Negative for chest pain and  palpitations.  Gastrointestinal:  Negative for abdominal pain, constipation, diarrhea, heartburn, nausea and vomiting.  Skin: Negative.  Negative for itching and rash.  Neurological:  Negative for dizziness and headaches.  Endo/Heme/Allergies:  Negative for environmental allergies. Does not bruise/bleed easily.  All other systems reviewed and are negative.      Objective:   Blood pressure 118/74, pulse (!) 116, temperature 98.5 F (36.9 C), resp. rate 16, height 5' 4.5" (1.638 m), weight 136 lb 6 oz (61.9 kg), SpO2 97 %. Body mass index is 23.05 kg/m.     Physical Exam Vitals reviewed.  Constitutional:      Appearance: She is well-developed.     Comments: Very lovely female. Talkative.   HENT:     Head: Normocephalic and atraumatic.     Right Ear: Tympanic membrane, ear canal and external ear normal. No drainage, swelling or tenderness. Tympanic membrane is not injected, scarred, erythematous, retracted or bulging.     Left Ear: Tympanic membrane, ear canal and external ear normal. No drainage, swelling or tenderness. Tympanic membrane is not injected, scarred, erythematous, retracted or bulging.     Nose: Mucosal edema and rhinorrhea present. No nasal deformity or septal deviation.     Right Turbinates: Enlarged, swollen and pale.     Left Turbinates: Enlarged, swollen and pale.     Right Sinus: No maxillary sinus tenderness or frontal sinus tenderness.     Left Sinus: No maxillary sinus tenderness or frontal sinus tenderness.     Comments: No nasal polyps that I could see, but her turbinates were so enlarged that it was difficult for me to see very far into her nares anyway.     Mouth/Throat:     Mouth: Mucous membranes are not pale and not dry.     Pharynx: Uvula midline.   Eyes:     General: Allergic shiner present.        Right eye: No discharge.        Left eye: No discharge.     Conjunctiva/sclera: Conjunctivae normal.     Right eye: Right conjunctiva is not injected. No chemosis.    Left eye: Left conjunctiva is not injected. No chemosis.    Pupils: Pupils are equal, round, and reactive to light.  Cardiovascular:     Rate and Rhythm: Normal rate and regular rhythm.     Heart sounds: Normal heart sounds.  Pulmonary:     Effort: Pulmonary effort is normal. No tachypnea, accessory muscle usage or respiratory distress.     Breath sounds: Normal breath sounds. No wheezing, rhonchi or rales.     Comments: Doing well in all lung fields.  No increased work of breathing. Chest:     Chest wall: No tenderness.  Abdominal:     Tenderness: There is no abdominal tenderness. There is no guarding or rebound.  Lymphadenopathy:     Head:     Right side of head: No submandibular, tonsillar or occipital adenopathy.     Left side of head: No submandibular, tonsillar or occipital adenopathy.     Cervical: No cervical adenopathy.  Skin:    Coloration: Skin is not pale.     Findings: No abrasion, erythema, petechiae or rash. Rash is not papular, urticarial or vesicular.  Neurological:     Mental Status: She is alert.  Psychiatric:        Behavior: Behavior is cooperative.      Diagnostic studies:    Spirometry: results normal (FEV1: 2.78/89%, FVC: 3.31/88%,  FEV1/FVC: 84%).    Spirometry consistent with normal pattern.   Allergy Studies:     Airborne Adult Perc - 08/29/21 0900     Time Antigen Placed 6712    Allergen Manufacturer Lavella Hammock    Location Back    Number of Test 59    1. Control-Buffer 50% Glycerol Negative    2. Control-Histamine 1 mg/ml 2+    3. Albumin saline Negative    4. Pierce Negative    5. [place] Negative    6. Johnson Negative    7. Heil Blue Negative    8. Meadow Fescue Negative    9. Perennial Rye Negative    10. Sweet  Vernal Negative    11. Timothy Negative    12. Cocklebur Negative    13. Burweed Marshelder Negative    14. Ragweed, short Negative    15. Ragweed, Giant 3+    16. Plantain,  English Negative    17. Lamb's Quarters Negative    18. Sheep Sorrell Negative    19. Rough Pigweed Negative    20. Marsh Elder, Rough Negative    21. Mugwort, Common Negative    22. Ash mix Negative    23. Birch mix Negative    24. Beech American Negative    25. Box, Elder Negative    26. Cedar, red Negative    27. Cottonwood, [place] Negative    28. Elm mix Negative    29. Hickory Negative    30. Maple mix Negative    31. Oak, [place] mix Negative    32. Pecan Pollen Negative    33. Pine mix Negative    34. Sycamore Eastern Negative    35. Clifton, Black Pollen Negative    36. Alternaria alternata Negative    37. Cladosporium Herbarum Negative    38. Aspergillus mix Negative    39. Penicillium mix Negative    40. Bipolaris sorokiniana (Helminthosporium) Negative    41. Drechslera spicifera (Curvularia) Negative    42. Mucor plumbeus Negative    43. Fusarium moniliforme Negative    44. Aureobasidium pullulans (pullulara) Negative    45. Rhizopus oryzae Negative    46. Botrytis cinera Negative    47. Epicoccum nigrum Negative    48. Phoma betae Negative    49. Candida Albicans Negative    50. Trichophyton mentagrophytes Negative    51. Mite, D Farinae  5,000 AU/ml Negative    52. Mite, D Pteronyssinus  5,000 AU/ml Negative    53. Cat Hair 10,000 BAU/ml Negative    54.  Dog Epithelia Negative    55. Mixed Feathers Negative    56. Horse Epithelia Negative    57. Cockroach, German Negative    58. Mouse Negative    59. Tobacco Leaf Negative             Intradermal - 08/29/21 0936     Time Antigen Placed 4580    Allergen Manufacturer Lavella Hammock    Location Arm    Number of Test 14    Control Negative    Guatemala Negative    Johnson Negative    7 Grass Negative    Weed mix Negative    Tree  mix Negative    Mold 1 Negative    Mold 2 Negative    Mold 3 Negative    Mold 4 Negative    Cat Negative    Dog Negative    Cockroach Negative    Mite mix Negative  Allergy testing results were read and interpreted by myself, documented by clinical staff.         Salvatore Marvel, MD [facility]

## 2021-08-29 NOTE — Addendum Note (Signed)
Addended by: Valentina Shaggy on: 08/29/2021 01:19 PM   Modules accepted: Orders

## 2021-08-29 NOTE — Patient Instructions (Addendum)
1. Chronic rhinitis - Testing today showed: ragweed. - Copy of test results provided.  - Avoidance measures provided. - I truly expected more from your testing. - I do not think that blood work for environmental allergies would be enlightening if the intradermals were not reactive at all.  - This may be what is known as "local allergic rhinitis", which occurs when your immune system makes localized IgE (allergy antibodies) to various allergens (as opposed to systemic) IgE. - Therefore, since IgE is not make systemically, allergic sensitizations are not picked up on routine skin testing or blood testing. - There are specialized academic research centers that can test for IgE in the nasal cavity, but this is not done in the clinical setting at this time.  - It can still be treated with nasal sprays and antihistamines.  - Stop taking: Xyzal and Flonase - Start taking: Xhance (fluticasone) 1-2 sprays per nostril twice daily and carbinoxamine 4 mg every 8 hours as needed (can cause sleepiness, but it is good at drying up the nasal passages and typically the sleepiness improves over time).  - You can use an extra dose of the antihistamine, if needed, for breakthrough symptoms.  - Consider nasal saline rinses 1-2 times daily to remove allergens from the nasal cavities as well as help with mucous clearance (this is especially helpful to do before the nasal sprays are given) - I do not think that allergy shots would be useful at this point in time.   2. Chronic recurrent sinusitis - Consider immune workup in the future?  - With your infections only affecting one area (ie sinus infections), this makes an immunodeficiency less likely.   3. Return in about 4 weeks (around 09/26/2021). We need to see Caitlin Duarte again and get his asthma under better control. I wonder if he needs to be on an injectable asthma medication such as Dupixent or Nucala?    Please inform us of any Emergency Department visits,  hospitalizations, or changes in symptoms. Call us before going to the ED for breathing or allergy symptoms since we might be able to fit you in for a sick visit. Feel free to contact us anytime with any questions, problems, or concerns.  It was a pleasure to meet you today!  Websites that have reliable patient information: 1. American Academy of Asthma, Allergy, and Immunology: www.aaaai.org 2. Food Allergy Research and Education (FARE): foodallergy.org 3. Mothers of Asthmatics: http://www.asthmacommunitynetwork.org 4. American College of Allergy, Asthma, and Immunology: www.acaai.org   COVID-19 Vaccine Information can be found at: [email or web address] For questions related to vaccine distribution or appointments, please email [email or web address] or call [phone].   We realize that you might be concerned about having an allergic reaction to the COVID19 vaccines. To help with that concern, WE ARE OFFERING THE COVID19 VACCINES IN OUR OFFICE! Ask the front desk for dates!     "Like" [place] on Facebook and Instagram for our latest updates!      A healthy democracy works best when [employer] participate! Make sure you are registered to vote! If you have moved or changed any of your contact information, you will need to get this updated before voting!  In some cases, you MAY be able to register to vote online: [email or web address]      Airborne Adult Perc - 08/29/21 0900     Time Antigen Placed 5361    Allergen Manufacturer Lavella Hammock    Location Back    Number of Test 59    1. Control-Buffer  50% Glycerol Negative    2. Control-Histamine 1 mg/ml 2+    3. Albumin saline Negative    4. Downey Negative    5. [place] Negative    6. Johnson Negative    7. Emerson Blue Negative    8. Meadow Fescue Negative    9. Perennial Rye Negative    10. Sweet Vernal Negative    11. Timothy Negative    12.  Cocklebur Negative    13. Burweed Marshelder Negative    14. Ragweed, short Negative    15. Ragweed, Giant 3+    16. Plantain,  English Negative    17. Lamb's Quarters Negative    18. Sheep Sorrell Negative    19. Rough Pigweed Negative    20. Marsh Elder, Rough Negative    21. Mugwort, Common Negative    22. Ash mix Negative    23. Birch mix Negative    24. Beech American Negative    25. Box, Elder Negative    26. Cedar, red Negative    27. Cottonwood, [place] Negative    28. Elm mix Negative    29. Hickory Negative    30. Maple mix Negative    31. Oak, [place] mix Negative    32. Pecan Pollen Negative    33. Pine mix Negative    34. Sycamore Eastern Negative    35. [place], Black Pollen Negative    36. Alternaria alternata Negative    37. Cladosporium Herbarum Negative    38. Aspergillus mix Negative    39. Penicillium mix Negative    40. Bipolaris sorokiniana (Helminthosporium) Negative    41. Drechslera spicifera (Curvularia) Negative    42. Mucor plumbeus Negative    43. Fusarium moniliforme Negative    44. Aureobasidium pullulans (pullulara) Negative    45. Rhizopus oryzae Negative    46. Botrytis cinera Negative    47. Epicoccum nigrum Negative    48. Phoma betae Negative    49. Candida Albicans Negative    50. Trichophyton mentagrophytes Negative    51. Mite, D Farinae  5,000 AU/ml Negative    52. Mite, D Pteronyssinus  5,000 AU/ml Negative    53. Cat Hair 10,000 BAU/ml Negative    54.  Dog Epithelia Negative    55. Mixed Feathers Negative    56. Horse Epithelia Negative    57. Cockroach, German Negative    58. Mouse Negative    59. Tobacco Leaf Negative             Intradermal - 08/29/21 0936     Time Antigen Placed 2409    Allergen Manufacturer Lavella Hammock    Location Arm    Number of Test 14    Control Negative    Guatemala Negative    Johnson Negative    7 Grass Negative    Weed mix Negative    Tree mix Negative    Mold 1 Negative    Mold 2  Negative    Mold 3 Negative    Mold 4 Negative    Cat Negative    Dog Negative    Cockroach Negative    Mite mix Negative             Reducing Pollen Exposure  The American Academy of Allergy, Asthma and Immunology suggests the following steps to reduce your exposure to pollen during allergy seasons.    Do not hang sheets or clothing out to dry; pollen may collect on these items. Do not  mow lawns or spend time around freshly cut grass; mowing stirs up pollen. Keep windows closed at night.  Keep car windows closed while driving. Minimize morning activities outdoors, a time when pollen counts are usually at their highest. Stay indoors as much as possible when pollen counts or humidity is high and on windy days when pollen tends to remain in the air longer. Use air conditioning when possible.  Many air conditioners have filters that trap the pollen spores. Use a HEPA room air filter to remove pollen form the indoor air you breathe.    Rhinitis (Hayfever) Overview  There are two types of rhinitis: allergic and non-allergic.  Allergic Rhinitis If you have allergic rhinitis, your immune system mistakenly identifies a typically harmless substance as an intruder. This substance is called an allergen. The immune system responds to the allergen by releasing histamine and chemical mediators that typically cause symptoms in the nose, throat, eyes, ears, skin and roof of the mouth.  Seasonal allergic rhinitis (hay fever) is most often caused by pollen carried in the air during different times of the year in different parts of the country.  Allergic rhinitis can also be triggered by common indoor allergens such as the dried skin flakes, urine and saliva found on pet dander, mold, droppings from dust mites and cockroach particles. This is called perennial allergic rhinitis, as symptoms typically occur year-round.  In addition to allergen triggers, symptoms may also occur from irritants such  as smoke and strong odors, or to changes in the temperature and humidity of the air. This happens because allergic rhinitis causes inflammation in the nasal lining, which increases sensitivity to inhalants.  Many people with allergic rhinitis are prone to allergic conjunctivitis (eye allergy). In addition, allergic rhinitis can make symptoms of asthma worse for people who suffer from both conditions.  Nonallergic Rhinitis At least one out of three people with rhinitis symptoms do not have allergies. Nonallergic rhinitis usually afflicts adults and causes year-round symptoms, especially runny nose and nasal congestion. This condition differs from allergic rhinitis because the immune system is not involved.

## 2021-09-07 ENCOUNTER — Ambulatory Visit (INDEPENDENT_AMBULATORY_CARE_PROVIDER_SITE_OTHER): Payer: 59 | Admitting: Ophthalmology

## 2021-09-07 ENCOUNTER — Encounter (INDEPENDENT_AMBULATORY_CARE_PROVIDER_SITE_OTHER): Payer: Self-pay | Admitting: Ophthalmology

## 2021-09-07 DIAGNOSIS — H5213 Myopia, bilateral: Secondary | ICD-10-CM

## 2021-09-07 DIAGNOSIS — D3132 Benign neoplasm of left choroid: Secondary | ICD-10-CM | POA: Diagnosis not present

## 2021-09-07 DIAGNOSIS — Z9889 Other specified postprocedural states: Secondary | ICD-10-CM

## 2021-09-07 DIAGNOSIS — H3322 Serous retinal detachment, left eye: Secondary | ICD-10-CM | POA: Diagnosis not present

## 2021-09-07 DIAGNOSIS — H33323 Round hole, bilateral: Secondary | ICD-10-CM | POA: Diagnosis not present

## 2021-09-07 DIAGNOSIS — H35413 Lattice degeneration of retina, bilateral: Secondary | ICD-10-CM

## 2021-09-08 ENCOUNTER — Encounter (INDEPENDENT_AMBULATORY_CARE_PROVIDER_SITE_OTHER): Payer: Self-pay | Admitting: Ophthalmology

## 2021-09-15 ENCOUNTER — Ambulatory Visit (HOSPITAL_BASED_OUTPATIENT_CLINIC_OR_DEPARTMENT_OTHER)
Admission: RE | Admit: 2021-09-15 | Discharge: 2021-09-15 | Disposition: A | Discharge status: Home / Self Care | Payer: 59 | Source: Ambulatory Visit | Attending: Allergy & Immunology | Admitting: Allergy & Immunology

## 2021-09-15 ENCOUNTER — Encounter (HOSPITAL_BASED_OUTPATIENT_CLINIC_OR_DEPARTMENT_OTHER): Payer: Self-pay

## 2021-09-15 DIAGNOSIS — J310 Chronic rhinitis: Secondary | ICD-10-CM | POA: Diagnosis present

## 2021-09-15 DIAGNOSIS — J329 Chronic sinusitis, unspecified: Secondary | ICD-10-CM | POA: Diagnosis present

## 2021-09-19 ENCOUNTER — Encounter: Payer: Self-pay | Admitting: Allergy & Immunology

## 2021-09-26 ENCOUNTER — Telehealth: Payer: Self-pay

## 2021-09-26 MED ORDER — RYALTRIS 665-25 MCG/ACT NA SUSP: NASAL | 5 refills | Status: DC

## 2021-09-26 NOTE — Telephone Encounter (Signed)
Ryaltris one spray per nostril twice daily has been sent in.

## 2021-09-26 NOTE — Telephone Encounter (Signed)
Xhance 93 mcg/act PA.....  KEYEileen Stanford - PA - Case ID: [id number] Denied    Forwarding message to provider for next step.

## 2021-09-26 NOTE — Addendum Note (Signed)
Addended by: Larence Penning on: 09/26/2021 07:26 PM   Modules accepted: Orders

## 2021-09-26 NOTE — Telephone Encounter (Signed)
Let's try Ryaltris one spray per nostril twice daily.  Salvatore Marvel, MD [facility]

## 2021-10-03 ENCOUNTER — Ambulatory Visit: Payer: 59 | Admitting: Allergy & Immunology

## 2021-10-03 ENCOUNTER — Encounter: Payer: Self-pay | Admitting: Allergy & Immunology

## 2021-10-03 VITALS — BP 110/80 | HR 96 | Temp 98.6°F | Resp 16 | Wt 133.2 lb

## 2021-10-03 DIAGNOSIS — J310 Chronic rhinitis: Secondary | ICD-10-CM

## 2021-10-03 DIAGNOSIS — J329 Chronic sinusitis, unspecified: Secondary | ICD-10-CM

## 2021-10-03 LAB — HM PAP SMEAR

## 2021-10-03 LAB — RESULTS CONSOLE HPV: CHL HPV: NEGATIVE

## 2021-10-03 MED ORDER — AMOXICILLIN-POT CLAVULANATE 875-125 MG PO TABS: 1.0000 | ORAL_TABLET | Freq: Two times a day (BID) | ORAL | 0 refills | Status: AC

## 2021-10-03 MED ORDER — XHANCE 93 MCG/ACT NA EXHU: 1.0000 | INHALANT_SUSPENSION | Freq: Two times a day (BID) | NASAL | 5 refills | Status: DC

## 2021-10-03 NOTE — Progress Notes (Signed)
FOLLOW UP  Date of Service/Encounter:  10/03/21   Assessment:   Chronic rhinitis - with testing only positive to ragweed   Chronic recurrent sinusitis with chronic mucosal thickening and sphenoethmoidal obstruction - starting three weeks of antibiotics    Chronic cough - with normal spirometry (improved with the [facility])   [name] (quite devoted)    Plan/Recommendations:    1. Chronic rhinitis (ragweed)  - Previous today showed: ragweed. - Continue taking: Xhance (fluticasone) 1-2 sprays per nostril twice daily and carbinoxamine 4 mg every 8 hours as needed  - You can use an extra dose of the antihistamine, if needed, for breakthrough symptoms.  - Consider nasal saline rinses 1-2 times daily to remove allergens from the nasal cavities as well as help with mucous clearance (this is especially helpful to do before the nasal sprays are given) - I still do not think that allergy shots would be useful at this point in time.   2. Chronic recurrent sinusitis - We are going to do 3 weeks of antibiotics to clear this sinus infections.  - I know this is long, but this is what ENT would want to do anyway. - I think it will be instructive for Dr. Redmond Baseman when you see him.  - Text/update me!   3. Return in about 6 months (around 04/05/2022).    Subjective:   Caitlin Duarte is a 36 y.o. female presenting today for follow up of  Chief Complaint  Patient presents with   Follow-up    Like the new allergy medications that Caitlin Duarte was prescribed.Still coughing in the morning with mucous but not as bad.     Caitlin Duarte has a history of the following: Patient Active Problem List   Diagnosis Date Noted   Trapezius strain, right, initial encounter 07/18/2021   Cervical strain 07/18/2021   Chronic nonspecific colitis 06/13/2021   Hepatic steatosis 06/13/2021   Right nephrolithiasis 06/13/2021   Other specified retinal disorders 10/20/2019   Chronic migraine without aura without status  migrainosus, not intractable 03/05/2018   Allergic rhinitis 09/07/2013   Atypical nevus 09/07/2013    History obtained from: chart review and patient.  Caitlin Duarte is a 36 y.o. female presenting for a follow up visit.  We last saw her in June 2023.  At that time, Caitlin Duarte was positive to ragweed.  We felt that her symptoms were more consistent with local allergic rhinitis.  We stopped her Xyzal and Flonase and started Xhance 1 to 2 sprays per nostril daily and carbinoxamine 4 mg.  For her recurrent sinusitis, we talked about doing an immune work-up.  For her chronic cough, her spirometry was normal.  We decided to maximize for postnasal drip control to see if that helps with the cough first.  Since last visit, Caitlin Duarte has done well. We have helped her, but it is not completely resolved. Caitlin Duarte is doing the [place]. Caitlin Duarte got one month for free. Caitlin Duarte thinks that the carbinoxamine '4mg'$  daily. This reminded her of Chlortabs. Caitlin Duarte has a coughing fit 5 out of 7 days, which is better than it was previously. So Caitlin Duarte does think that what we have been doing is helping.   Caitlin Duarte did go to the beach and felt better since then. Caitlin Duarte was good this morning. Her symptoms started coming back over the course of the day since being back from the beach, however.   The Xhance does WAY more than the Flonase. Caitlin Duarte is happy with how it is working.  Caitlin Duarte is not sure how much it is going to cost.   Caitlin Duarte is going to see Dr. Redmond Baseman in August 29th. Caitlin Duarte is not excited about the prospect of surgery at all.  Caitlin Duarte had her sinus CT which did show some sphenoid sinusitis. Caitlin Duarte has not been on any antibiotics for this.   IMPRESSION: Moderate right and severe left maxillary sinus mucosal thickening with occlusion of the right ostiomeatal unit. Mild sphenoid sinusitis with occlusion of the sphenoethmoidal recesses.  Otherwise, there have been no changes to her past medical history, surgical history, family history, or social history.    Review of Systems   Constitutional: Negative.  Negative for chills, fever, malaise/fatigue and weight loss.  HENT:  Positive for congestion and sinus pain. Negative for ear discharge and ear pain.   Eyes:  Negative for pain, discharge and redness.  Respiratory:  Positive for cough. Negative for sputum production, shortness of breath and wheezing.   Cardiovascular: Negative.  Negative for chest pain and palpitations.  Gastrointestinal:  Negative for abdominal pain, constipation, diarrhea, heartburn, nausea and vomiting.  Skin: Negative.  Negative for itching and rash.  Neurological:  Negative for dizziness and headaches.  Endo/Heme/Allergies:  Negative for environmental allergies. Does not bruise/bleed easily.       Objective:   Blood pressure 110/80, pulse 96, temperature 98.6 F (37 C), temperature source Temporal, resp. rate 16, weight 133 lb 3.2 oz (60.4 kg), SpO2 98 %. Body mass index is 22.51 kg/m.    Physical Exam Vitals reviewed.  Constitutional:      Appearance: Caitlin Duarte is well-developed.     Comments: Very lovely female. Talkative.   HENT:     Head: Normocephalic and atraumatic.     Right Ear: Tympanic membrane, ear canal and external ear normal. No drainage, swelling or tenderness. Tympanic membrane is not injected, scarred, erythematous, retracted or bulging.     Left Ear: Tympanic membrane, ear canal and external ear normal. No drainage, swelling or tenderness. Tympanic membrane is not injected, scarred, erythematous, retracted or bulging.     Nose: Mucosal edema and rhinorrhea present. No nasal deformity, septal deviation or congestion.     Right Turbinates: Enlarged, swollen and pale.     Left Turbinates: Enlarged, swollen and pale.     Right Sinus: No maxillary sinus tenderness or frontal sinus tenderness.     Left Sinus: No maxillary sinus tenderness or frontal sinus tenderness.     Comments: No nasal polyps that I could see, but her turbinates were so enlarged that it was difficult for  me to see very far into her nares anyway.     Mouth/Throat:     Mouth: Mucous membranes are not pale and not dry.     Pharynx: Uvula midline.  Eyes:     General: Allergic shiner present.        Right eye: No discharge.        Left eye: No discharge.     Conjunctiva/sclera: Conjunctivae normal.     Right eye: Right conjunctiva is not injected. No chemosis.    Left eye: Left conjunctiva is not injected. No chemosis.    Pupils: Pupils are equal, round, and reactive to light.  Cardiovascular:     Rate and Rhythm: Normal rate and regular rhythm.     Heart sounds: Normal heart sounds.  Pulmonary:     Effort: Pulmonary effort is normal. No tachypnea, accessory muscle usage or respiratory distress.     Breath sounds: Normal breath  sounds. No stridor. No wheezing, rhonchi or rales.     Comments: Doing well in all lung fields.  No increased work of breathing. Chest:     Chest wall: No tenderness.  Lymphadenopathy:     Head:     Right side of head: No submandibular, tonsillar or occipital adenopathy.     Left side of head: No submandibular, tonsillar or occipital adenopathy.     Cervical: No cervical adenopathy.  Skin:    Coloration: Skin is not pale.     Findings: No abrasion, erythema, petechiae or rash. Rash is not papular, urticarial or vesicular.  Neurological:     Mental Status: Caitlin Duarte is alert.  Psychiatric:        Behavior: Behavior is cooperative.      Diagnostic studies: none        Salvatore Marvel, MD  [facility] [place]

## 2021-10-03 NOTE — Patient Instructions (Addendum)
1. Chronic rhinitis (ragweed)  - Previous today showed: ragweed. - Continue taking: Xhance (fluticasone) 1-2 sprays per nostril twice daily and carbinoxamine 4 mg every 8 hours as needed  - You can use an extra dose of the antihistamine, if needed, for breakthrough symptoms.  - Consider nasal saline rinses 1-2 times daily to remove allergens from the nasal cavities as well as help with mucous clearance (this is especially helpful to do before the nasal sprays are given) - I still do not think that allergy shots would be useful at this point in time.   2. Chronic recurrent sinusitis - We are going to do 3 weeks of antibiotics to clear this sinus infections.  - I know this is long, but this is what ENT would want to do anyway. - I think it will be instructive for Dr. Redmond Baseman when you see him.  - Text/update me!   3. Return in about 6 months (around 04/05/2022).   Please inform us of any Emergency Department visits, hospitalizations, or changes in symptoms. Call us before going to the ED for breathing or allergy symptoms since we might be able to fit you in for a sick visit. Feel free to contact us anytime with any questions, problems, or concerns.  It was a pleasure to see you today!  Websites that have reliable patient information: 1. American Academy of Asthma, Allergy, and Immunology: www.aaaai.org 2. Food Allergy Research and Education (FARE): foodallergy.org 3. Mothers of Asthmatics: http://www.asthmacommunitynetwork.org 4. American College of Allergy, Asthma, and Immunology: www.acaai.org   COVID-19 Vaccine Information can be found at: ShippingScam.[email or web address].uk For questions related to vaccine distribution or appointments, please email [email or web address] or call [phone].   We realize that you might be concerned about having an allergic reaction to the COVID19 vaccines. To help with that concern, WE ARE OFFERING THE COVID19  VACCINES IN OUR OFFICE! Ask the front desk for dates!     "Like" [place] on Facebook and Instagram for our latest updates!      A healthy democracy works best when [employer] participate! Make sure you are registered to vote! If you have moved or changed any of your contact information, you will need to get this updated before voting!  In some cases, you MAY be able to register to vote online: [email or web address]        Reducing Pollen Exposure  The American Academy of Allergy, Asthma and Immunology suggests the following steps to reduce your exposure to pollen during allergy seasons.    Do not hang sheets or clothing out to dry; pollen may collect on these items. Do not mow lawns or spend time around freshly cut grass; mowing stirs up pollen. Keep windows closed at night.  Keep car windows closed while driving. Minimize morning activities outdoors, a time when pollen counts are usually at their highest. Stay indoors as much as possible when pollen counts or humidity is high and on windy days when pollen tends to remain in the air longer. Use air conditioning when possible.  Many air conditioners have filters that trap the pollen spores. Use a HEPA room air filter to remove pollen form the indoor air you breathe.    Rhinitis (Hayfever) Overview  There are two types of rhinitis: allergic and non-allergic.  Allergic Rhinitis If you have allergic rhinitis, your immune system mistakenly identifies a typically harmless substance as an intruder. This substance is called an allergen. The immune system responds to the allergen by releasing histamine and chemical mediators that typically  cause symptoms in the nose, throat, eyes, ears, skin and roof of the mouth.  Seasonal allergic rhinitis (hay fever) is most often caused by pollen carried in the air during different times of the year in different parts of the country.  Allergic rhinitis can also be triggered  by common indoor allergens such as the dried skin flakes, urine and saliva found on pet dander, mold, droppings from dust mites and cockroach particles. This is called perennial allergic rhinitis, as symptoms typically occur year-round.  In addition to allergen triggers, symptoms may also occur from irritants such as smoke and strong odors, or to changes in the temperature and humidity of the air. This happens because allergic rhinitis causes inflammation in the nasal lining, which increases sensitivity to inhalants.  Many people with allergic rhinitis are prone to allergic conjunctivitis (eye allergy). In addition, allergic rhinitis can make symptoms of asthma worse for people who suffer from both conditions.  Nonallergic Rhinitis At least one out of three people with rhinitis symptoms do not have allergies. Nonallergic rhinitis usually afflicts adults and causes year-round symptoms, especially runny nose and nasal congestion. This condition differs from allergic rhinitis because the immune system is not involved.

## 2022-01-31 ENCOUNTER — Encounter: Payer: Self-pay | Admitting: Family Medicine

## 2022-01-31 ENCOUNTER — Telehealth: Payer: Self-pay | Admitting: Family Medicine

## 2022-01-31 ENCOUNTER — Ambulatory Visit: Payer: 59 | Admitting: Family Medicine

## 2022-01-31 VITALS — BP 122/80 | HR 94 | Temp 97.9°F | Ht 64.0 in | Wt 135.4 lb

## 2022-01-31 DIAGNOSIS — J029 Acute pharyngitis, unspecified: Secondary | ICD-10-CM | POA: Diagnosis not present

## 2022-01-31 DIAGNOSIS — B349 Viral infection, unspecified: Secondary | ICD-10-CM

## 2022-01-31 DIAGNOSIS — H6991 Unspecified Eustachian tube disorder, right ear: Secondary | ICD-10-CM | POA: Diagnosis not present

## 2022-01-31 LAB — POCT INFLUENZA A/B
Influenza A, POC: NEGATIVE
Influenza B, POC: NEGATIVE

## 2022-01-31 LAB — POC COVID19 BINAXNOW: SARS Coronavirus 2 Ag: NEGATIVE

## 2022-01-31 LAB — POCT RAPID STREP A (OFFICE): Rapid Strep A Screen: NEGATIVE

## 2022-01-31 MED ORDER — PREDNISONE 20 MG PO TABS: 20.0000 mg | ORAL_TABLET | Freq: Two times a day (BID) | ORAL | 0 refills | Status: AC

## 2022-01-31 NOTE — Telephone Encounter (Signed)
Patient notified VI phone that if she isn't better in 1 week than to call and make an appointment to return and be re-evaluated. No further questions.   Dm/cma

## 2022-01-31 NOTE — Telephone Encounter (Signed)
Pt was just seen by Dr Ethelene Hal for Viral syndrome +2 more today 01/31/22, she would like to know if Dr Ethelene Hal will reconsider giving her an antibiotic. She is afraid it will get worse this weekend and she will have to miss work. Please advise pt at [phone]

## 2022-01-31 NOTE — Progress Notes (Signed)
Established Patient Office Visit  Subjective   Patient ID: Caitlin Duarte, female    DOB: 05-05-1985  Age: 36 y.o. MRN: [id number]  Chief Complaint  Patient presents with   Conjunctivitis    Continued pink eye with swelling, sore throat, right ear pain symptoms x 1 week.     Conjunctivitis  Associated symptoms include congestion, ear pain, headaches, hearing loss, sore throat, cough and wheezing. Pertinent negatives include no abdominal pain, no rash, no eye discharge and no eye redness.   45-day history of URI symptoms with headache, nasal congestion, postnasal drip, ear congestion right greater than left sore throat, cough and fatigue.  Saw her eye doctor on Monday for conjunctivitis with eyedrops.  Eyes are improving.  There is been no nausea or vomiting.  Type is diminished though.  No asthma history but has developed wheezing with allergy symptoms.  Status post T&A in 2020.  No history of otitis media as a child.    Review of Systems  Constitutional:  Positive for malaise/fatigue.  HENT:  Positive for congestion, ear pain, hearing loss and sore throat.   Eyes:  Negative for blurred vision, discharge and redness.  Respiratory:  Positive for cough and wheezing. Negative for shortness of breath.   Cardiovascular: Negative.   Gastrointestinal:  Negative for abdominal pain.  Genitourinary: Negative.   Musculoskeletal: Negative.  Negative for myalgias.  Skin:  Negative for rash.  Neurological:  Positive for headaches. Negative for tingling, loss of consciousness and weakness.  Endo/Heme/Allergies:  Negative for polydipsia.      Objective:     BP 122/80 (BP Location: Right Arm, Patient Position: Sitting, Cuff Size: Normal)   Pulse 94   Temp 97.9 F (36.6 C) (Temporal)   Ht '5\' 4"'$  (1.626 m)   Wt 135 lb 6.4 oz (61.4 kg)   SpO2 97%   BMI 23.24 kg/m    Physical Exam Constitutional:      General: She is not in acute distress.    Appearance: Normal appearance. She is not  ill-appearing, toxic-appearing or diaphoretic.  HENT:     Head: Normocephalic and atraumatic.     Right Ear: External ear normal.     Left Ear: External ear normal.     Mouth/Throat:     Mouth: Mucous membranes are moist.     Pharynx: Oropharynx is clear. No oropharyngeal exudate or posterior oropharyngeal erythema.  Eyes:     General: No scleral icterus.       Right eye: No discharge.        Left eye: No discharge.     Extraocular Movements: Extraocular movements intact.     Conjunctiva/sclera: Conjunctivae normal.     Pupils: Pupils are equal, round, and reactive to light.     Comments: Conjunctive are mildly injected.  Cardiovascular:     Rate and Rhythm: Normal rate and regular rhythm.  Pulmonary:     Effort: Pulmonary effort is normal. No respiratory distress.     Breath sounds: Normal breath sounds. No wheezing or rales.  Abdominal:     General: Bowel sounds are normal.     Tenderness: There is no abdominal tenderness. There is no guarding.  Musculoskeletal:     Cervical back: No rigidity or tenderness.  Lymphadenopathy:     Cervical: No cervical adenopathy.  Skin:    General: Skin is warm and dry.  Neurological:     Mental Status: She is alert and oriented to person, place, and time.  Psychiatric:  Mood and Affect: Mood normal.        Behavior: Behavior normal.      Results for orders placed or performed in visit on 01/31/22  POC COVID-19  Result Value Ref Range   SARS Coronavirus 2 Ag Negative Negative  POCT rapid strep A  Result Value Ref Range   Rapid Strep A Screen Negative Negative  POCT Influenza A/B  Result Value Ref Range   Influenza A, POC Negative Negative   Influenza B, POC Negative Negative      The ASCVD Risk score (Arnett DK, et al., 2019) failed to calculate for the following reasons:   The 2019 ASCVD risk score is only valid for ages 30 to 56    Assessment & Plan:   Problem List Items Addressed This Visit   None Visit  Diagnoses     Viral syndrome    -  Primary   Relevant Orders   POC COVID-19 (Completed)   POCT Influenza A/B (Completed)   Dysfunction of right eustachian tube       Relevant Medications   predniSONE (DELTASONE) 20 MG tablet   Pharyngitis, unspecified etiology       Relevant Medications   predniSONE (DELTASONE) 20 MG tablet   Other Relevant Orders   POCT rapid strep A (Completed)       Return in about 1 week (around 02/07/2022), or if symptoms worsen or fail to improve.  Prednisone 20 mg twice daily for 7 days eustachian tube dysfunction with pharyngitis.  Rest and drink plenty of fluids.  Continue eyedrops as prescribed by ophthalmology.  Information was given on eustachian tube dysfunction and sore throat.  Follow-up in 1 week if not improving.  Libby Maw, MD

## 2022-04-10 ENCOUNTER — Ambulatory Visit: Payer: 59 | Admitting: Allergy & Immunology

## 2022-04-12 ENCOUNTER — Ambulatory Visit: Payer: 59 | Admitting: Allergy & Immunology

## 2022-04-19 ENCOUNTER — Ambulatory Visit: Payer: 59 | Admitting: Allergy & Immunology

## 2022-04-22 IMAGING — CR DG SACRUM/COCCYX 2+V
3 series · 3 of 3 positions shown · non-contrast
Comparison: None.

CLINICAL DATA: Status post trauma.

EXAM:
SACRUM AND COCCYX - 2+ VIEW

[t coccyx lat]
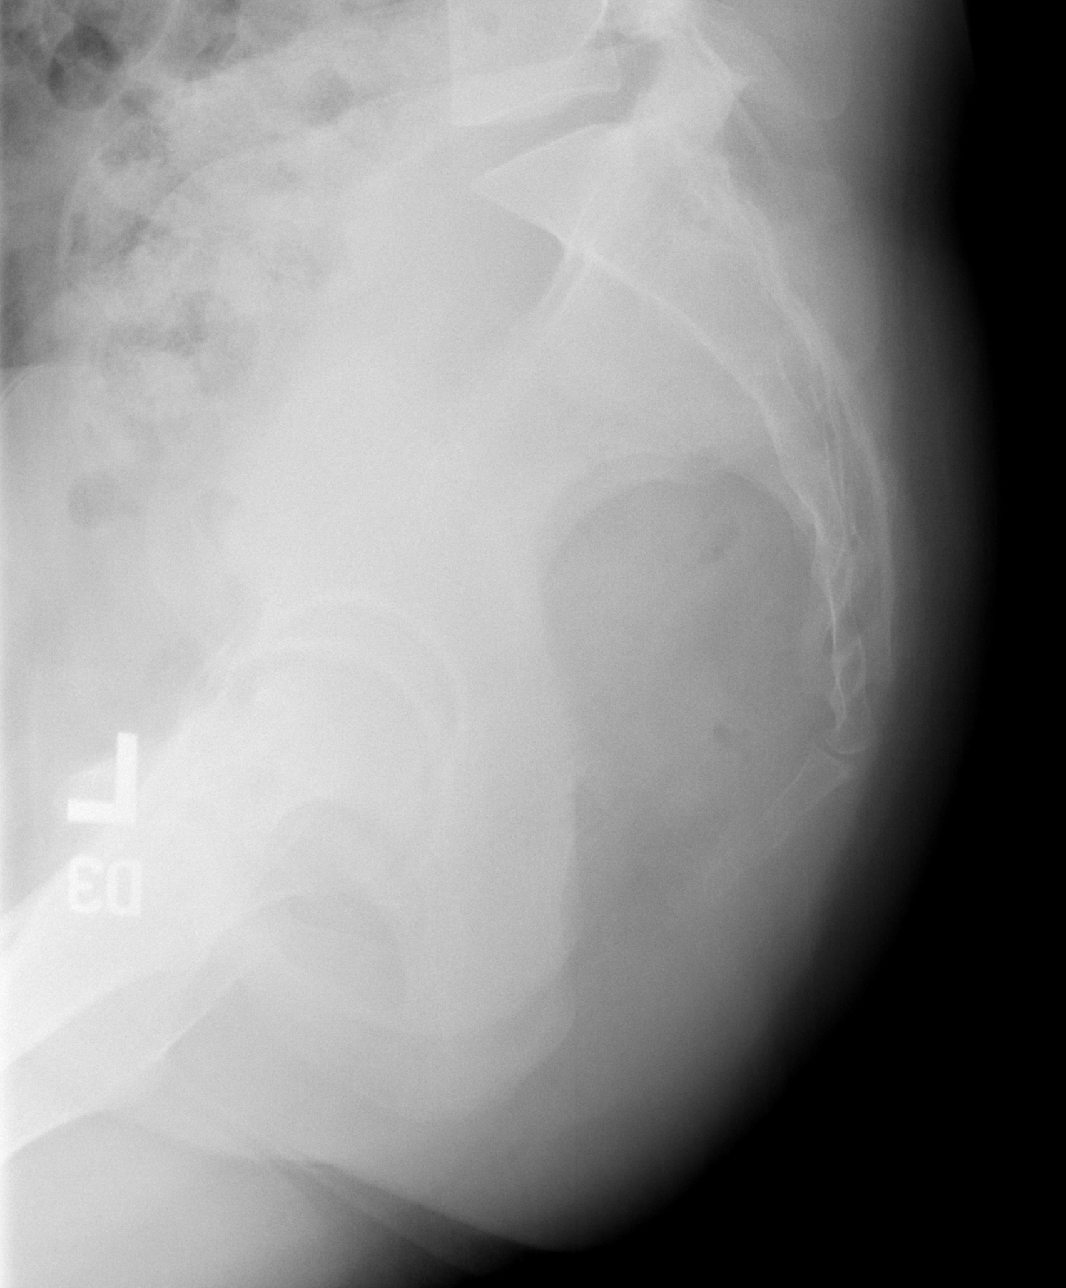

[t sacrum a.p.]
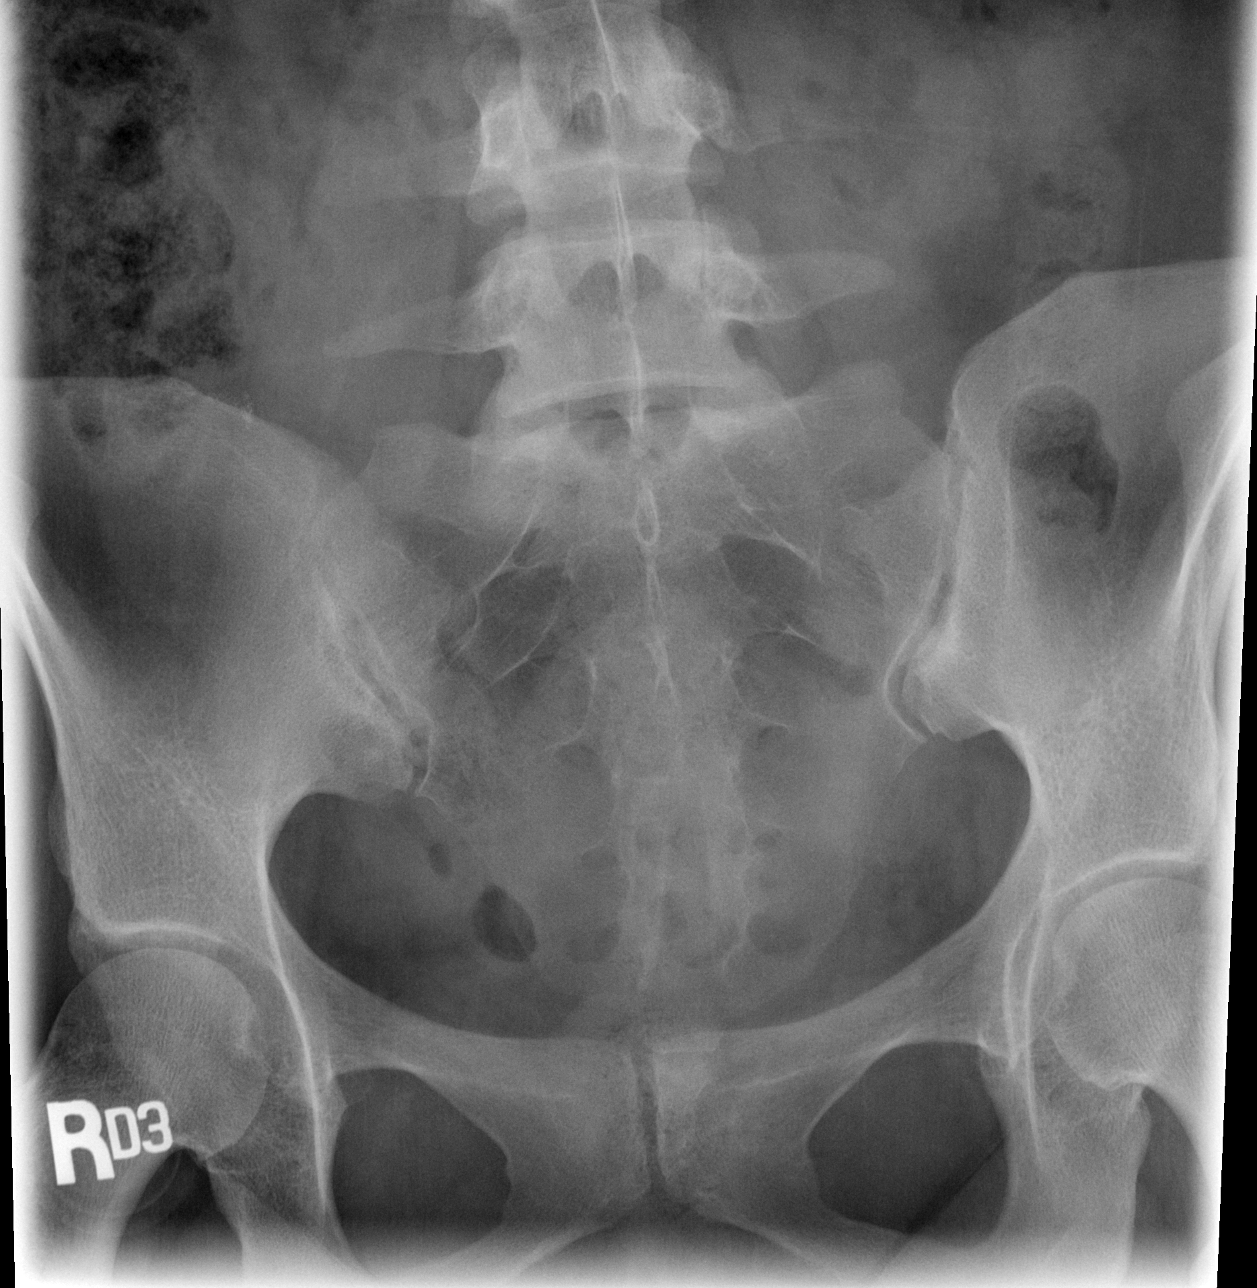

[t coccyx a.p.]
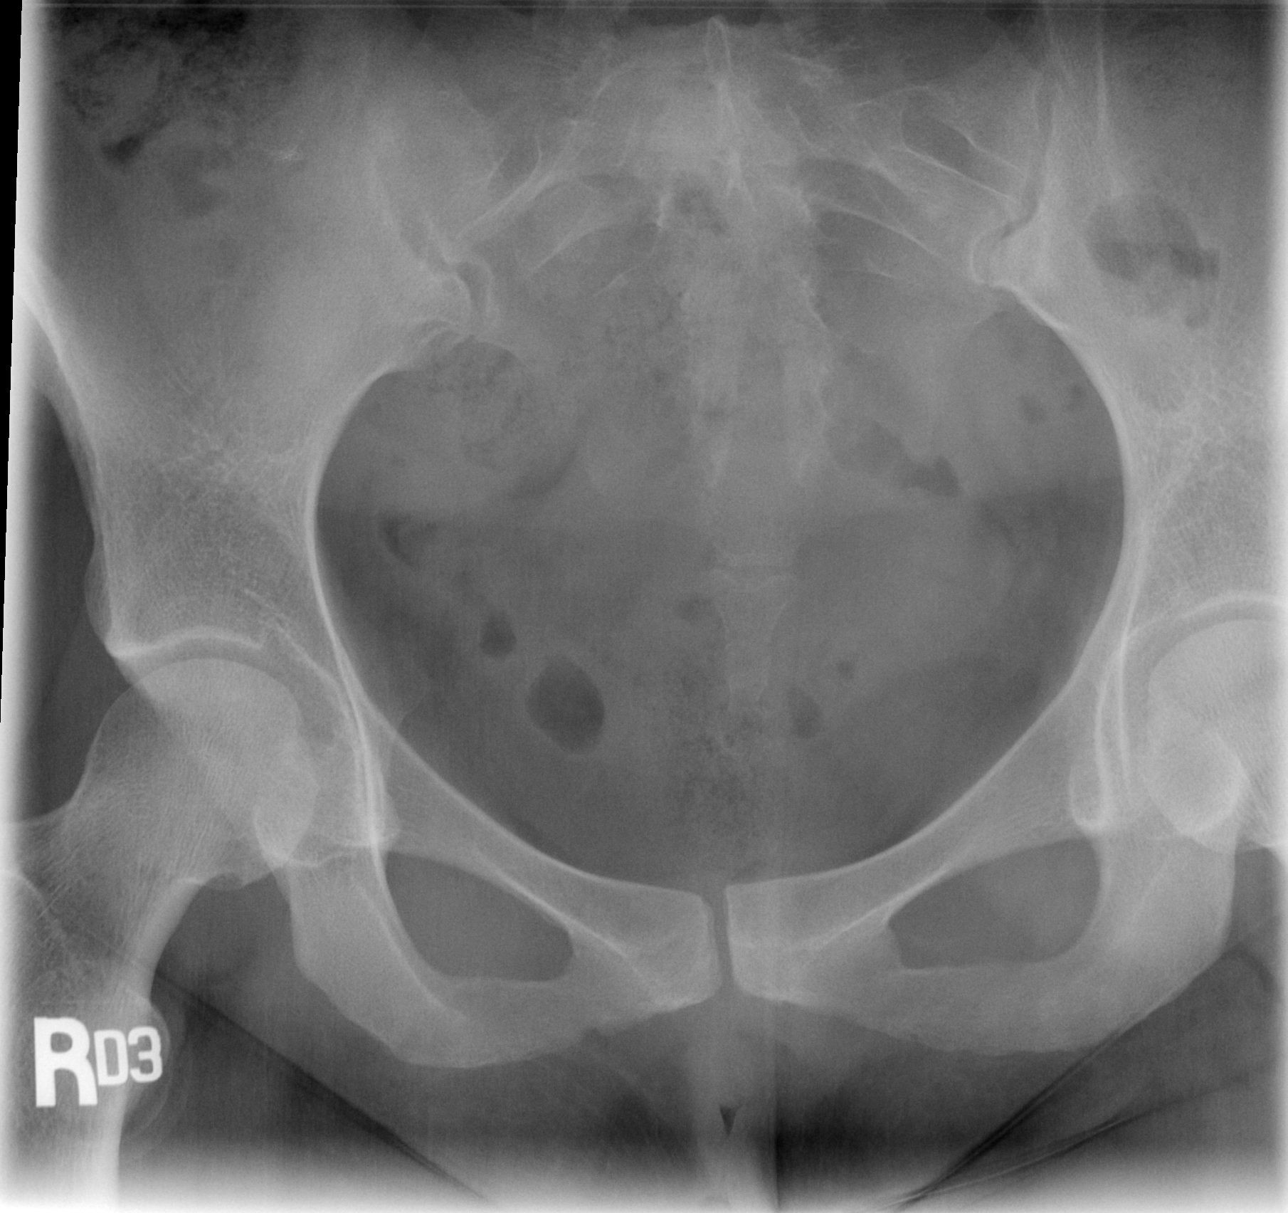

[3 of 3 positions shown; findings below may reference images not displayed]

FINDINGS: There is no evidence of fracture or other focal bone lesions.
IMPRESSION: No acute osseous abnormality. CT correlation is recommended if acute
fracture remains of clinical concern.

## 2022-05-23 ENCOUNTER — Telehealth: Payer: Self-pay | Admitting: Neurology

## 2022-05-23 ENCOUNTER — Encounter: Payer: Self-pay | Admitting: Neurology

## 2022-05-23 ENCOUNTER — Telehealth (INDEPENDENT_AMBULATORY_CARE_PROVIDER_SITE_OTHER): Payer: 59 | Admitting: Neurology

## 2022-05-23 DIAGNOSIS — G43709 Chronic migraine without aura, not intractable, without status migrainosus: Secondary | ICD-10-CM

## 2022-05-23 NOTE — Telephone Encounter (Signed)
Please start botox for migraine approval process, she can go to [place] thanks

## 2022-05-23 NOTE — Telephone Encounter (Signed)
Chronic Migraine CPT 64615  Botox J0585 Units:200  G43.709 Chronic Migraine without aura, not intractable, without status migrainous 

## 2022-05-23 NOTE — Patient Instructions (Signed)
Start botox  Botulinum Toxin Cosmetic Injection Botulinum toxin injection is a procedure to soften lines and wrinkles in the face. Botulinum toxin is produced by a bacteria called Clostridium botulinum. The toxin is injected into muscles with a thin needle. The toxin works by blocking nerve impulses to certain muscles. This weakens and paralyzes those muscles for a short time, which reduces the wrinkles when you move your face. Tell a health care provider about: Any allergies you have, especially allergies to eggs or milk. All medicines you are taking, including antibiotics, vitamins, herbs, eye drops, creams, and over-the-counter medicines. Any bleeding problems you have. Any surgeries you have had. Any medical conditions you have. Whether you are pregnant or may be pregnant. Whether you are breastfeeding. What are the risks? Your health care provider will talk with you about risks. These may include: Pain in the neck or jaw. Allergic reaction to the toxin. Infection. Double vision or blurry vision. Drooping of the eyebrow or eyelid. Not being able to close one or both eyes. Changes in voice or speech. Trouble swallowing. What happens before the procedure? Ask your provider about: Changing or stopping your regular medicines. These include any diabetes medicines or blood thinners you take. Taking medicines such as aspirin and ibuprofen. These medicines can thin your blood. Do not take them unless your provider tells you to. Taking over-the-counter medicines, vitamins, herbs, and supplements. Do not drink alcohol if: Your provider tells you not to drink. You are pregnant, may be pregnant, or plan to become pregnant. If you drink alcohol: Limit how much you have to: 0-1 drink a day if you are female. 0-2 drinks a day if you are female. Know how much alcohol is in your drink. In the [place] one drink is one 12 oz bottle of beer (355 mL), one 5 oz glass of wine (148 mL), or one 1 oz glass  of hard liquor (44 mL). What happens during the procedure?  The area to be treated may be: Chilled with ice. Numbed with medicine. The treatment site may have ice applied or you may be given a medicine to numb the area (local anesthetic) to reduce discomfort. Your provider will inject small amounts of the toxin into certain muscles. The number of injections that you get will depend on your specific condition and the area that is being treated. The procedure may vary among providers and hospitals. What can I expect after the procedure? After your procedure, it is common to have: Mild pain, soreness, swelling, itching, or redness around the injection sites. Redness may last for 1-2 days. In rare cases, it may last longer than 2 days. Small bruises around the injection sites. These may go away in a few days. Bumps or marks from the needle. These may go away within a few hours. Brief numbness near the injection sites. Headache. This is rare. The results of your treatment may take up to 14 days to fully show, although many people will see the benefits in 3-5 days after treatment. In most people, the benefits last about 3-4 months. The effectiveness of botulinum toxin will last longer with follow-up treatments. Follow these instructions at home: Managing pain and discomfort  Take over-the-counter and prescription medicines only as told by your provider. If told, put ice on the affected area. Put ice in a plastic bag. Place a towel between your skin and the bag. Leave the ice on for 20 minutes, 2-3 times per day. If your skin turns bright red, remove the  ice right away to prevent skin damage. The risk of damage is higher if you cannot feel pain, heat, or cold. General instructions Do not do activities that take a lot of effort for 12 hours after the procedure or for as long as told by your provider. This includes not lifting heavy items, not working out, not doing yoga, and not doing activities  that increase your heart rate. Do not lie down for 4 hours after treatment or as told by your provider. Do not rub the injected area for at least 4 hours after the procedure. This can spread the botulinum toxin to surrounding muscles. Depending on the injection site: Your provider may ask you to avoid moving the muscles in the injected area. Follow instructions from your provider. Your provider may tell you to frown or squint regularly. You may need to do these facial exercises every 15 minutes for 1 hour after treatment, or as told by your provider. Do not get laser treatments, facials, or facial massages for 1-2 weeks after the procedure or for as long as told by your provider. Contact a health care provider if: You have a headache that gets worse. You have a fever. Your eyelids become droopy or swollen. You have trouble speaking or swallowing. Get help right away if: You develop signs of an allergic reaction. These include: An itchy rash or welts. Wheezing or shortness of breath. Dizziness. These symptoms may be an emergency. Get help right away. Call 911. Do not wait to see if the symptoms will go away. Do not drive yourself to the hospital. This information is not intended to replace advice given to you by your health care provider. Make sure you discuss any questions you have with your health care provider. Document Revised: 11/06/2021 Document Reviewed: 11/06/2021 Elsevier Patient Education  [place].

## 2022-05-23 NOTE — Progress Notes (Signed)
[facility]    Provider:  Dr Jaynee Eagles Referring Provider: Billey Chang, MD Primary Care Physician:  Billey Chang, MD  CC:  migraines  Virtual Visit via Video Note  I connected with Caitlin Duarte on 05/23/22 at 11:30 AM EDT by a video enabled telemedicine application and verified that I am speaking with the correct person using two identifiers.  Location: Patient: home Provider: office    Follow Up Instructions:    I discussed the assessment and treatment plan with the patient. The patient was provided an opportunity to ask questions and all were answered. The patient agreed with the plan and demonstrated an understanding of the instructions.   The patient was advised to call back or seek an in-person evaluation if the symptoms worsen or if the condition fails to improve as anticipated.  I provided 60 minutes of non-face-to-face time during this encounter.   Melvenia Beam, MD   HISTORY OF PRESENT ILLNESS:  HPI 05/23/2022:  Patient is here for migraines. In the last 6 months she is having more migraines. She has been diagnosed with Episcleritis and scleritis of the eyes. I reviewed her notes from [facility]: See below, she has been diagnosed with episcleritis scleritis, she is on her way to her [place] appointment, I asked her to make sure that Botox would be okay with her eye condition and to let me know if not.  We discussed options, lifestyle, preventative and acute, will start Botox approval.  She is having 10 migraine days a month and > 15 headache days a month. Can be left or right sided. Can last up to 24 hours. She takes rizatriptan and it sometimes helps she needs to take 2. Moderate to severe.  Migraine started in high school, she did not have many migraines after her first child but started having heavy periods after that and the migraines worsened in the past, she has had MRIs which were normal, migraines are throbbing pulsating pounding, photophobia and  phonophobia, nausea, vomiting, no aura, no medication overuse, no other associated symptoms.  Medications tried include: imitrex, rizatriptan, tylenol, flexeril, ajovy, topamax, amotriptyline/nortriptyline, ajovy, blood pressure medications contraindicated due to low blood pressure  Reviewed notes, labs and imaging from outside physicians, which showed      Latest Ref Rng & Units 02/15/2021    4:15 PM 08/19/2019   10:02 AM 11/11/2018    3:22 PM  CMP  Glucose 70 - 99 mg/dL 76  80  89   BUN 6 - 23 mg/dL '13  10  10   '$ Creatinine 0.40 - 1.20 mg/dL 0.67  0.51  0.46   Sodium 135 - 145 mEq/L 137  140  139   Potassium 3.5 - 5.1 mEq/L 3.9  4.2  3.9   Chloride 96 - 112 mEq/L 103  106  106   CO2 19 - 32 mEq/L '25  26  27   '$ Calcium 8.4 - 10.5 mg/dL 9.7  9.7  9.6   Total Protein 6.0 - 8.3 g/dL 6.8  6.5  6.4   Total Bilirubin 0.2 - 1.2 mg/dL 0.5  0.7  0.3   Alkaline Phos 39 - 117 U/L 62  67  69   AST 0 - 37 U/L '19  15  16   '$ ALT 0 - 35 U/L '22  19  22       '$ Latest Ref Rng & Units 06/13/2021    3:50 PM 02/15/2021    4:15 PM 08/19/2019   10:02 AM  CBC  WBC 4.0 - 10.5 K/uL 10.2  19.8 Repeated and verified X2.  5.8   Hemoglobin 12.0 - 15.0 g/dL 12.9  13.7  14.0   Hematocrit 36.0 - 46.0 % 38.3  41.4  41.1   Platelets 150.0 - 400.0 K/uL 341.0  386.0  352.0      [facility] Dr. Spero Curb 04/30/2022: 1. 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  TECHNIQUE: Standard noncontrast brain CT.  FINDINGS: Brain Parenchyma: There is no hemorrhage, cerebral edema, acute cortical infarction, mass, mass effect, or midline shift. Ventricles and Sulci: Normal for age.   Extra-Axial Spaces: No extra-axial fluid collection. Basal Cisterns: Normal.  Paranasal Sinuses: Left maxillary sinus mucosal retention cyst. Mastoid air cells: Normal.  Orbits: Normal. Cranium and Bones: Normal. Soft Tissues: Normal.   IMPRESSION: No acute intracranial abnormalities.     Electronically Reviewed by:  Algis Downs, MD, [facility] Electronically Reviewed on:  04/02/2022 1:06 PM  I have reviewed the images and concur with the above findings.  Electronically Signed by:  Sarita Bottom, MD, [facility] Electronically Signed on:  04/02/2022 2:39 PM Procedure Note  Vogler, Seward Speck, MD - 04/02/2022 Formatting of this note might be different from the original. CT BRAIN WITHOUT CONTRAST  INDICATION: worst headache of life, R51.9 Headache, unspecified  COMPARISON: None.  TECHNIQUE: Standard noncontrast brain CT.  FINDINGS: Brain Parenchyma: There is no hemorrhage, cerebral edema, acute cortical infarction, mass, mass effect, or midline shift. Ventricles and Sulci: Normal for age.   Extra-Axial Spaces: No extra-axial fluid collection. Basal Cisterns: Normal.  Paranasal Sinuses: Left maxillary sinus mucosal retention cyst. Mastoid air cells: Normal.  Orbits: Normal. Cranium and Bones: Normal. Soft Tissues: Normal.   IMPRESSION: No acute intracranial abnormalities.    Patient complains of symptoms per HPI as well as the following  symptoms: migraines . Pertinent negatives and positives per HPI. All others negative    03/10/19 Caitlin Duarte is a 37 y.o. female here today for follow up for migraines. She is currently using Maxalt for abortive therapy. Over the past few months, she has noticed more frequent migraines. She hasn't tracked Usually unilateral, pounding. Blurry vision. She has light and sound sensitivity. Sometimes she feels nauseated. Maxalt does normally help after second dose. She had a tonsillectomy this year. She had an eye exam this summer. She was seen by a retina specialist . She does have some concerns of word finding difficulty with migraines. No history of kidney stones. BP usually runs around 100's/50-60's. She has Mirena IUD. She would forget to take OCPs.     Juanice has experienced more frequent migraines  over the past couple of months.  She also has concerns of word finding difficulty.  Blood pressures run on the lower side.  She is concerned about her ability to take medication every day.  She has responded well to CGRP therapy in the past.  We will start Aimovig 140 mg every 30 days.  I have discussed frequent side effects, proper administration and storage of this medication with Raquel Sarna.  Additional information has been provided in her AVS.  Co-pay card given.  She will continue Maxalt 10 mg as needed for abortive therapy.  Adequate hydration, healthy diet and regular exercise advised.  She will follow-up with me in 6 months, sooner if needed.  She verbalizes understanding and agreement with this plan.  HPI:  Caitlin Duarte is a 37 y.o. female here as requested by Billey Chang, MDfor migraines. Migraines worsening. She has a 45 month old. Migraine started in high school. She didn't have many migraines after her first child she breast fed for a year and became pregnant. She has an IUD. She started having heavy periods in August and those are when she started having worsening migraines. She had the  worst headache recently. She had MRIs in the past which were normal. She started a new job. She stopped breastfeeding. She has 3 migraines a month and can each last 3 days. They can be anytime, not related to her monthly menses. She takes Maxalt. Excedrin in the past. Migraines throbbing, pain, left sided usually, +light/sound sensitivity, nausea, vomiting. Lasted 3 days and did not respond to acute treatment. She can have "floaters" before her migraines but often no aura, she can associated ear pain and hearing changes.   Reviewed notes, labs and imaging from outside physicians, which showed   CT head 03/2014 showed No acute intracranial abnormalities including mass lesion or mass effect, hydrocephalus, extra-axial fluid collection, midline shift, hemorrhage, or acute infarction, large ischemic events (personally reviewed images)  Reviewed Dr. Johney Frame notes.  Patient has 2 children youngest child is 29 months and she is currently breast-feeding.  Patient presents due to her intractable migraine.  Started over 24 hours prior to appointment.  Associated photophobia, phonophobia, nausea with vomiting, confusion with an aura with left-sided visual changes that are typical for her headaches.  Prior to that her last migraine was about a month ago.  Prior to that they have been rare.  She is used Maxalt in the past.  She is use Excedrin Migraine.  She was treated with Toradol and Phenergan in the office without significant relief.  She was sent home with Zofran and tramadol.   Review of Systems: Patient complains of symptoms per HPI as well as the following symptoms: headache. Pertinent negatives and positives per HPI. All others negative.   Social History   Socioeconomic History   Marital status: Married    Spouse name: Not on file   Number of children: 2   Years of education: Not on file   Highest education level: Bachelor's degree (e.g., BA, AB, BS)  Occupational History   Occupation: Librarian     Comment: [employer]  Tobacco Use   Smoking status: Never   Smokeless tobacco: Never  Vaping Use   Vaping Use: Never used  Substance and Sexual Activity   Alcohol use: Never    Comment: socially   Drug use: No   Sexual activity: Yes    Birth control/protection: None  Other Topics Concern   Not on file  Social History  Narrative   Lives at home with her husband and 2 sons   Right handed    Caffeine: 1 cup daily   Social Determinants of Health   Financial Resource Strain: Not on file  Food Insecurity: Not on file  Transportation Needs: Not on file  Physical Activity: Not on file  Stress: Not on file  Social Connections: Not on file  Intimate Partner Violence: Not on file    Family History  Problem Relation Age of Onset   Hypertension Father    Diabetes Father    Diverticulitis Father    Anemia Sister    Thyroid disease Sister    Headache Sister        ?migraines   Diverticulitis Sister    Colon polyps Sister    Alzheimer's disease Maternal Grandfather    Stroke Maternal Grandfather    Diabetes Paternal Grandmother    Hypertension Paternal Grandmother    Heart disease Paternal Grandmother    Heart attack Paternal Grandmother    Diabetes Paternal Grandfather    Hypertension Paternal Grandfather    Colon cancer Neg Hx    Pancreatic cancer Neg Hx    Esophageal cancer Neg Hx    Liver cancer Neg Hx    Stomach cancer Neg Hx     Past Medical History:  Diagnosis Date   Allergy    Asthma    ?allergy induced asthma   Migraines    Seasonal allergies     Past Surgical History:  Procedure Laterality Date   APPENDECTOMY  2008   EYE MUSCLE SURGERY  1997   NOSE SURGERY  2006   RETINAL LASER PROCEDURE     SINUS SURGERY WITH INSTATRAK     TONSILLECTOMY  2020   WISDOM TOOTH EXTRACTION  2006    Current Outpatient Medications  Medication Sig Dispense Refill   Carbinoxamine Maleate 4 MG TABS Take 1 tablet (4 mg total) by mouth every 8 (eight)  hours as needed. 30 tablet 5   Fluticasone Propionate (XHANCE) 93 MCG/ACT EXHU Place 1-2 sprays into the nose 2 (two) times daily. 16 mL 5   levocetirizine (XYZAL) 5 MG tablet Take 5 mg by mouth every evening.     moxifloxacin (VIGAMOX) 0.5 % ophthalmic solution Apply to eye.     RESTASIS 0.05 % ophthalmic emulsion 1 drop 2 (two) times daily.     rizatriptan (MAXALT-MLT) 10 MG disintegrating tablet Take 1 tablet (10 mg total) by mouth as needed for migraine. May repeat in 2 hours if needed. (max 2/24hrs). 10 tablet 5   No current facility-administered medications for this visit.    Allergies as of 05/23/2022 - Review Complete 05/23/2022  Allergen Reaction Noted   Other Anaphylaxis and Shortness Of Breath 09/15/2012    Vitals: There were no vitals taken for this visit. Last Weight:  Wt Readings from Last 1 Encounters:  01/31/22 135 lb 6.4 oz (61.4 kg)   Last Height:   Ht Readings from Last 1 Encounters:  01/31/22 '5\' 4"'$  (1.626 m)    Physical exam: Exam: Gen: NAD, conversant      CV: attempted, Could not perform over Web Video. Denies palpitations or chest pain or SOB. VS: Breathing at a normal rate. Weight appears within normal limits. Not febrile. Eyes: Conjunctivae clear without exudates or hemorrhage  Neuro: Detailed Neurologic Exam  Speech:    Speech is normal; fluent and spontaneous with normal comprehension.  Cognition:    The patient is oriented to person, place, and time;  recent and remote memory intact;     language fluent;     normal attention, concentration,     fund of knowledge Cranial Nerves:    The pupils are equal, round, and reactive to light. Attempted, Cannot perform fundoscopic exam. Visual fields are full to finger confrontation. Extraocular movements are intact.  The face is symmetric with normal sensation. The palate elevates in the midline. Hearing intact. Voice is normal. Shoulder shrug is normal. The tongue has normal motion without  fasciculations.   Coordination:    Normal finger to nose  Gait:    Normal native gait  Motor Observation:   no involuntary movements noted. Tone:    Appears normal  Posture:    Posture is normal. normal erect    Strength:    Strength is anti-gravity and symmetric in the upper and lower limbs.      Sensation: intact to LT     Reflex Exam:  DTR's:    Attempted, Could not perform over Web Video   Toes: Attempted Could not perform over Web Video  Clonus:   Attempted, Could not perform over Web Video         Assessment/Plan:  Patient with chronic migraine disussed options will approve for botox  Medications tried include: imitrex, rizatriptan, tylenol, flexeril, ajovy, topamax, amotriptyline/nortriptyline, ajovy, blood pressure medications contraindicated due to low blood pressure  Discussed: To prevent or relieve headaches, try the following: Cool Compress. Lie down and place a cool compress on your head.  Avoid headache triggers. If certain foods or odors seem to have triggered your migraines in the past, avoid them. A headache diary might help you identify triggers.  Include physical activity in your daily routine. Try a daily walk or other moderate aerobic exercise.  Manage stress. Find healthy ways to cope with the stressors, such as delegating tasks on your to-do list.  Practice relaxation techniques. Try deep breathing, yoga, massage and visualization.  Eat regularly. Eating regularly scheduled meals and maintaining a healthy diet might help prevent headaches. Also, drink plenty of fluids.  Follow a regular sleep schedule. Sleep deprivation might contribute to headaches Consider biofeedback. With this mind-body technique, you learn to control certain bodily functions -- such as muscle tension, heart rate and blood pressure -- to prevent headaches or reduce headache pain.    Proceed to emergency room if you experience new or worsening symptoms or symptoms do not  resolve, if you have new neurologic symptoms or if headache is severe, or for any concerning symptom.   Provided education and documentation from American headache Society toolbox including articles on: chronic migraine medication overuse headache, chronic migraines, prevention of migraines, behavioral and other nonpharmacologic treatments for headache.  Cc: Billey Chang, MD  Sarina Ill, MD  [facility] [address] [place], [place] [address]

## 2022-05-25 ENCOUNTER — Other Ambulatory Visit (HOSPITAL_COMMUNITY): Payer: Self-pay

## 2022-05-25 NOTE — Telephone Encounter (Signed)
Pharmacy Patient Advocate Encounter   Received notification from [facility] that prior authorization for Botox 200 Units is required/requested.   PA submitted on 05/25/2022 to (ins) [employer] via CoverMyMeds Key or (Medicaid) confirmation # [id number] Status is pending

## 2022-05-25 NOTE — Telephone Encounter (Signed)
   Benefit Verification [id number] Submitted!

## 2022-05-30 NOTE — Telephone Encounter (Signed)
Pharmacy Patient Advocate Encounter  Received notification from [employer] that the request for prior authorization for Botox 200 units has been denied due to See below.      Please be advised we currently do not have a Pharmacist to review denials, therefore you will need to process appeals accordingly as needed. Thanks for your support at this time.  It was denied thru pharmacy benefits will submit to [facility] via portal via medical benefits

## 2022-05-30 NOTE — Telephone Encounter (Signed)
    This will be Buy and Bill 

## 2022-06-07 ENCOUNTER — Ambulatory Visit: Payer: 59 | Admitting: Neurology

## 2022-06-18 ENCOUNTER — Ambulatory Visit: Payer: 59 | Admitting: Neurology

## 2022-07-03 ENCOUNTER — Ambulatory Visit: Payer: 59 | Admitting: Adult Health

## 2022-07-26 NOTE — Progress Notes (Signed)
[facility] - Clinic Note  08/01/2022     CHIEF COMPLAINT Patient presents for Retina Follow Up  HISTORY OF PRESENT ILLNESS: Caitlin Duarte is a 37 y.o. female who presents to the clinic today for:   HPI     Retina Follow Up   In both eyes.  This started 1 year ago.  Duration of 1 year.  Since onset it is stable.  I, the attending physician,  performed the HPI with the patient and updated documentation appropriately.        Comments   1 year retina follow up lattice deg ou and would like a 2nd opinion for ocular rosacea pt reports no vision changes noticed unless she has a flare up she is using doxy compound PRN       Last edited by Rennis Chris, MD on 08/06/2022 12:41 AM.    Pt is here for a second opinion on ocular rosacea, pt states she has been seen at [facility] several times since December, she was also sent to rheumatology to see if she had an autoimmune disorder, but she did not have one, pt states her symptoms started with pink eye that she got from her kids, she states her kids pink eye went away really quickly and hers did not, she states her eye was very red and painful, she states she was on steroid drops, but as soon as she would taper off, the symptoms came back, she was put on oral steroids, which also did not help either, she is on doxycycline which has been helping, she is also on an ointment that she puts around her eyes, the last time she saw the rheumatologist, he said she has ocular rosacea due to the fact that the doxy was helping, pt has not had a FA at [facility], she is still having eye pain every 3-4 days, she states her eyes are still very red and teary, she hasn't been able to wear CL since December, she is light sensitive, her vision becomes blurry when she is having a flare up, pt was put on a indomethacin, but was unable to tolerate it, she is currently on topical dexamethasone (preservative free) and oral doxycycline  Referring  physician: Alma Downs, PA-C [address] [place],  [place] [address]  HISTORICAL INFORMATION:   Selected notes from the MEDICAL RECORD NUMBER Referred by Alma Downs concern of inferior retinal breaks OS   CURRENT MEDICATIONS: Current Outpatient Medications (Ophthalmic Drugs)  Medication Sig   moxifloxacin (VIGAMOX) 0.5 % ophthalmic solution Apply to eye.   RESTASIS 0.05 % ophthalmic emulsion 1 drop 2 (two) times daily.   No current facility-administered medications for this visit. (Ophthalmic Drugs)   Current Outpatient Medications (Other)  Medication Sig   Carbinoxamine Maleate 4 MG TABS Take 1 tablet (4 mg total) by mouth every 8 (eight) hours as needed.   Fluticasone Propionate (XHANCE) 93 MCG/ACT EXHU Place 1-2 sprays into the nose 2 (two) times daily.   levocetirizine (XYZAL) 5 MG tablet Take 5 mg by mouth every evening.   rizatriptan (MAXALT-MLT) 10 MG disintegrating tablet Take 1 tablet (10 mg total) by mouth as needed for migraine. May repeat in 2 hours if needed. (max 2/24hrs).   No current facility-administered medications for this visit. (Other)   REVIEW OF SYSTEMS: ROS   Positive for: Gastrointestinal, Eyes Negative for: Constitutional, Neurological, Skin, Genitourinary, Musculoskeletal, HENT, Endocrine, Cardiovascular, Respiratory, Psychiatric, Allergic/Imm, Heme/Lymph Last edited by Etheleen Mayhew,  COT on 08/01/2022  2:51 PM.     ALLERGIES Allergies  Allergen Reactions   Other Anaphylaxis and Shortness Of Breath    curry   PAST MEDICAL HISTORY Past Medical History:  Diagnosis Date   Allergy    Asthma    ?allergy induced asthma   Migraines    Seasonal allergies    Past Surgical History:  Procedure Laterality Date   APPENDECTOMY  2008   EYE MUSCLE SURGERY  1997   NOSE SURGERY  2006   RETINAL LASER PROCEDURE     SINUS SURGERY WITH INSTATRAK     TONSILLECTOMY  2020   WISDOM TOOTH EXTRACTION  2006   FAMILY HISTORY Family History   Problem Relation Age of Onset   Hypertension Father    Diabetes Father    Diverticulitis Father    Anemia Sister    Thyroid disease Sister    Headache Sister        ?migraines   Diverticulitis Sister    Colon polyps Sister    Alzheimer's disease Maternal Grandfather    Stroke Maternal Grandfather    Diabetes Paternal Grandmother    Hypertension Paternal Grandmother    Heart disease Paternal Grandmother    Heart attack Paternal Grandmother    Diabetes Paternal Grandfather    Hypertension Paternal Grandfather    Colon cancer Neg Hx    Pancreatic cancer Neg Hx    Esophageal cancer Neg Hx    Liver cancer Neg Hx    Stomach cancer Neg Hx    SOCIAL HISTORY Social History   Tobacco Use   Smoking status: Never   Smokeless tobacco: Never  Vaping Use   Vaping Use: Never used  Substance Use Topics   Alcohol use: Never    Comment: socially   Drug use: No       OPHTHALMIC EXAM:  Base Eye Exam     Visual Acuity (Snellen - Linear)       Right Left   Dist cc 20/25 -2 20/25 -2         Tonometry (Tonopen, 2:55 PM)       Right Left   Pressure 13 17         Pupils       Pupils Dark Light Shape React APD   Right PERRL 3 2 Round Brisk None   Left PERRL 3 2 Round Brisk None         Visual Fields       Left Right    Full Full         Extraocular Movement       Right Left    Full, Ortho Full, Ortho         Neuro/Psych     Oriented x3: Yes   Mood/Affect: Normal         Dilation     Both eyes: 2.5% Phenylephrine @ 2:56 PM           Slit Lamp and Fundus Exam     Slit Lamp Exam       Right Left   Lids/Lashes Normal, mild scaling UL Normal, mild scaling UL   Conjunctiva/Sclera White and quiet White and quiet   Cornea Trace tear film debris Trace tear film debris   Anterior Chamber deep and clear, no cell or flare deep and clear, no cell or flare   Iris Round and dilated Round and dilated   Lens Clear Clear   Anterior Vitreous clear  clear  Fundus Exam       Right Left   Disc Pink and sharp, Compact Pink and sharp   C/D Ratio 0.3 0.3   Macula Flat, Good foveal reflex, No heme or edema Flat, Good foveal reflex; pigmented subfoveal choroidal nevus (approx 1DD) with stable resolution in focal SRF   Vessels Normal mild attenuation, mild tortuosity   Periphery Attached, Multiple patches of lattice from 1100-0100, patch of lattice from 0500-0730 -- good laser surrounding all lesions; no new RT/RD or lattice Attached, multiple patches of lattice from 11:00-0300, pigmented lattice at 4:30, 5:00, and 6:00 with atrophic holes - good laser changes surrounding all lesions; no new RT/RD or lattice           Refraction     Wearing Rx       Sphere Cylinder Axis   Right -7.75 +2.25 012   Left -9.00 +4.00 171            IMAGING AND PROCEDURES  Imaging and Procedures for @TODAY @  OCT, Retina - OU - Both Eyes       Right Eye Quality was good. Central Foveal Thickness: 276. Progression has been stable. Findings include normal foveal contour, no IRF, no SRF, vitreomacular adhesion .   Left Eye Quality was good. Central Foveal Thickness: 326. Progression has been stable. Findings include normal foveal contour, no IRF, no SRF, retinal drusen , vitreomacular adhesion (subfoveal choroidal hyper-reflective lesion with elevation and overlying drusen -- stable; stable resolution of SRF overlying lesion).   Notes *Images captured and stored on drive  Diagnosis / Impression:  NFP; no IRF/SRF OU OS: subfoveal choroidal hyper-reflective lesion with elevation and overlying drusen-- stable; stable resolution of SRF overlying lesion  Clinical management:  See below  Abbreviations: NFP - Normal foveal profile. CME - cystoid macular edema. PED - pigment epithelial detachment. IRF - intraretinal fluid. SRF - subretinal fluid. EZ - ellipsoid zone. ERM - epiretinal membrane. ORA - outer retinal atrophy. ORT - outer retinal  tubulation. SRHM - subretinal hyper-reflective material            ASSESSMENT/PLAN:    ICD-10-CM   1. Ocular rosacea  L71.8     2. Scleritis and episcleritis of both eyes  H15.003    H15.103     3. Lattice degeneration of both retinas  H35.413     4. Retinal hole of both eyes  H33.323     5. Choroidal nevus of left eye  D31.32     6. Serous retinal detachment of left eye  H33.22 OCT, Retina - OU - Both Eyes    7. Myopia of both eyes with astigmatism  H52.13    H52.203     8. History of strabismus surgery  Z98.890      1,2. Ocular rosacea w/ history of scleritis/episcleritis OU  - first flare in Dec 2023 -- multiple recurrences  - managed by Dr. Jackie Plum at [facility]  - currently on po doxycycline and topical preservative-free dexamethasone  - failed indomethacin tx  - exam today with BCVA 20/25 OU; no inflammation on exam  - pt interested in second opinion re: ocular rosacea / episodes of scleritis/episcleritis -- will refer to Dr. Sherryll Burger at [facility] in [place]  3,4. Lattice degeneration w/ atrophic holes, both eyes  - OD: patches of lattice from 1130-12 and at 0730  - OS: patches of lattice from 11-130 and 430-6   - s/p laser retinopexy OS (07.31.20) - good laser changes  surrounding all lesions  - s/p laser retinopexy OD (08.07.20) - good laser surrounding all lesions - new development of lattice degeneration at 1245 and 0200-0300 -- s/p laser retinopexy (06.17.22) -- good laser changes in place  - no new RT/RD or lattice  - f/u 1 year, DFE, OCT  5,6. Choroidal nevus OS with history of +SRF/focal serous RD  - relatively flat, pigmented, subfoveal nevus  - ~1DD in size  - no orange pigment or drusen  - focal SRF noted on exam 8.6.21 overlying nevus --stably resolved today  - now under the expert management of Dr. Pearletha Furl, who has treated w/ IVA x3 (08.12.21, 09.15.21, 10.19.21)  - OCT shows subfoveal hyperreflective choroidal lesion with stable  improvement in SRF/edema -- SRF stably resolved  7. High myopia w/ astigmatism OU  - discussed association of high myopia with lattice degeneration and increased risk of RT/RD  - RD warnings given  - Patient has family history of high myopia (Mother and maternal Grandmother--Grandmother has history of RD and Fuchs Dystrophy--history of cornea transplant )  8.  Hx of strabismus surgery  - hx of muscle surgery at age 37 with Dr. Hortense Ramal (20/20-)  Ophthalmic Meds Ordered this visit:  No orders of the defined types were placed in this encounter.    Return in about 6 months (around 02/01/2023) for f/u uveitis OU, DFE, OCT.  There are no Patient Instructions on file for this visit.  Explained the diagnoses, plan, and follow up with the patient and they expressed understanding.  Patient expressed understanding of the importance of proper follow up care.   This document serves as a record of services personally performed by Karie Chimera, MD, PhD. It was created on their behalf by Annalee Genta, COMT. The creation of this record is the provider's dictation and/or activities during the visit.  Electronically signed by: Annalee Genta, COMT 08/06/22 12:54 AM  This document serves as a record of services personally performed by Karie Chimera, MD, PhD. It was created on their behalf by Glee Arvin. Manson Passey, OA an ophthalmic technician. The creation of this record is the provider's dictation and/or activities during the visit.    Electronically signed by: Glee Arvin. Manson Passey, [place] 05.22.2024 12:54 AM  Karie Chimera, M.D., Ph.D. Diseases & Surgery of the Retina and Vitreous [facility]  I have reviewed the above documentation for accuracy and completeness, and I agree with the above. Karie Chimera, M.D., Ph.D. 08/06/22 12:54 AM   Abbreviations: M myopia (nearsighted); A astigmatism; H hyperopia (farsighted); P presbyopia; Mrx spectacle prescription;  CTL contact lenses; OD  right eye; OS left eye; OU both eyes  XT exotropia; ET esotropia; PEK punctate epithelial keratitis; PEE punctate epithelial erosions; DES dry eye syndrome; MGD meibomian gland dysfunction; ATs artificial tears; PFAT's preservative free artificial tears; NSC nuclear sclerotic cataract; PSC posterior subcapsular cataract; ERM epi-retinal membrane; PVD posterior vitreous detachment; RD retinal detachment; DM diabetes mellitus; DR diabetic retinopathy; NPDR non-proliferative diabetic retinopathy; PDR proliferative diabetic retinopathy; CSME clinically significant macular edema; DME diabetic macular edema; dbh dot blot hemorrhages; CWS cotton wool spot; POAG primary open angle glaucoma; C/D cup-to-disc ratio; HVF humphrey visual field; GVF goldmann visual field; OCT optical coherence tomography; IOP intraocular pressure; BRVO Branch retinal vein occlusion; CRVO central retinal vein occlusion; CRAO central retinal artery occlusion; BRAO branch retinal artery occlusion; RT retinal tear; SB scleral buckle; PPV pars plana vitrectomy; VH Vitreous hemorrhage; PRP panretinal laser photocoagulation; IVK intravitreal  kenalog; VMT vitreomacular traction; MH Macular hole;  NVD neovascularization of the disc; NVE neovascularization elsewhere; AREDS age related eye disease study; ARMD age related macular degeneration; POAG primary open angle glaucoma; EBMD epithelial/anterior basement membrane dystrophy; ACIOL anterior chamber intraocular lens; IOL intraocular lens; PCIOL posterior chamber intraocular lens; Phaco/IOL phacoemulsification with intraocular lens placement; PRK photorefractive keratectomy; LASIK laser assisted in situ keratomileusis; HTN hypertension; DM diabetes mellitus; COPD chronic obstructive pulmonary disease

## 2022-08-01 ENCOUNTER — Ambulatory Visit (INDEPENDENT_AMBULATORY_CARE_PROVIDER_SITE_OTHER): Payer: 59 | Admitting: Ophthalmology

## 2022-08-01 ENCOUNTER — Encounter (INDEPENDENT_AMBULATORY_CARE_PROVIDER_SITE_OTHER): Payer: Self-pay | Admitting: Ophthalmology

## 2022-08-01 DIAGNOSIS — H15003 Unspecified scleritis, bilateral: Secondary | ICD-10-CM | POA: Diagnosis not present

## 2022-08-01 DIAGNOSIS — H33323 Round hole, bilateral: Secondary | ICD-10-CM

## 2022-08-01 DIAGNOSIS — H15103 Unspecified episcleritis, bilateral: Secondary | ICD-10-CM

## 2022-08-01 DIAGNOSIS — H35413 Lattice degeneration of retina, bilateral: Secondary | ICD-10-CM

## 2022-08-01 DIAGNOSIS — H3322 Serous retinal detachment, left eye: Secondary | ICD-10-CM

## 2022-08-01 DIAGNOSIS — L718 Other rosacea: Secondary | ICD-10-CM

## 2022-08-01 DIAGNOSIS — H5213 Myopia, bilateral: Secondary | ICD-10-CM

## 2022-08-01 DIAGNOSIS — D3132 Benign neoplasm of left choroid: Secondary | ICD-10-CM

## 2022-08-01 DIAGNOSIS — Z9889 Other specified postprocedural states: Secondary | ICD-10-CM

## 2022-08-04 ENCOUNTER — Other Ambulatory Visit: Payer: Self-pay | Admitting: Family Medicine

## 2022-08-04 DIAGNOSIS — G43709 Chronic migraine without aura, not intractable, without status migrainosus: Secondary | ICD-10-CM

## 2022-08-06 ENCOUNTER — Encounter (INDEPENDENT_AMBULATORY_CARE_PROVIDER_SITE_OTHER): Payer: Self-pay | Admitting: Ophthalmology

## 2022-08-07 NOTE — Telephone Encounter (Signed)
Called patient and she is following up with her neurologist next week and then she will call and make an appointment.  Dm/cma

## 2022-08-21 ENCOUNTER — Ambulatory Visit: Payer: 59 | Admitting: Neurology

## 2022-08-21 DIAGNOSIS — G43709 Chronic migraine without aura, not intractable, without status migrainosus: Secondary | ICD-10-CM | POA: Diagnosis not present

## 2022-08-21 MED ORDER — ONABOTULINUMTOXINA 200 UNITS IJ SOLR
155.0000 [IU] | Freq: Once | INTRAMUSCULAR | Status: AC
Start: 2022-08-21 — End: 2022-08-21
  Administered 2022-08-21: 155 [IU] via INTRAMUSCULAR

## 2022-08-21 MED ORDER — RIZATRIPTAN BENZOATE 10 MG PO TBDP
ORAL_TABLET | ORAL | 11 refills | Status: DC
Start: 2022-08-21 — End: 2023-09-18

## 2022-08-21 NOTE — Progress Notes (Signed)
Consent Form Botulism Toxin Injection For Chronic Migraine  08/21/2022: first botox. +a   Reviewed orally with patient, additionally signature is on file:  Botulism toxin has been approved by the Federal drug administration for treatment of chronic migraine. Botulism toxin does not cure chronic migraine and it may not be effective in some patients.  The administration of botulism toxin is accomplished by injecting a small amount of toxin into the muscles of the neck and head. Dosage must be titrated for each individual. Any benefits resulting from botulism toxin tend to wear off after 3 months with a repeat injection required if benefit is to be maintained. Injections are usually done every 3-4 months with maximum effect peak achieved by about 2 or 3 weeks. Botulism toxin is expensive and you should be sure of what costs you will incur resulting from the injection.  The side effects of botulism toxin use for chronic migraine may include:   -Transient, and usually mild, facial weakness with facial injections  -Transient, and usually mild, head or neck weakness with head/neck injections  -Reduction or loss of forehead facial animation due to forehead muscle weakness  -Eyelid drooping  -Dry eye  -Pain at the site of injection or bruising at the site of injection  -Double vision  -Potential unknown long term risks  Contraindications: You should not have Botox if you are pregnant, nursing, allergic to albumin, have an infection, skin condition, or muscle weakness at the site of the injection, or have myasthenia gravis, Lambert-Eaton syndrome, or ALS.  It is also possible that as with any injection, there may be an allergic reaction or no effect from the medication. Reduced effectiveness after repeated injections is sometimes seen and rarely infection at the injection site may occur. All care will be taken to prevent these side effects. If therapy is given over a long time, atrophy and wasting in  the muscle injected may occur. Occasionally the patient's become refractory to treatment because they develop antibodies to the toxin. In this event, therapy needs to be modified.  I have read the above information and consent to the administration of botulism toxin.    BOTOX PROCEDURE NOTE FOR MIGRAINE HEADACHE    Contraindications and precautions discussed with patient(above). Aseptic procedure was observed and patient tolerated procedure. Procedure performed by Dr. Artemio Aly  The condition has existed for more than 6 months, and pt does not have a diagnosis of ALS, Myasthenia Gravis or Lambert-Eaton Syndrome.  Risks and benefits of injections discussed and pt agrees to proceed with the procedure.  Written consent obtained  These injections are medically necessary. Pt  receives good benefits from these injections. These injections do not cause sedations or hallucinations which the oral therapies may cause.  Description of procedure:  The patient was placed in a sitting position. The standard protocol was used for Botox as follows, with 5 units of Botox injected at each site:   -Procerus muscle, midline injection  -Corrugator muscle, bilateral injection  -Frontalis muscle, bilateral injection, with 2 sites each side, medial injection was performed in the upper one third of the frontalis muscle, in the region vertical from the medial inferior edge of the superior orbital rim. The lateral injection was again in the upper one third of the forehead vertically above the lateral limbus of the cornea, 1.5 cm lateral to the medial injection site.  -Temporalis muscle injection, 4 sites, bilaterally. The first injection was 3 cm above the tragus of the ear, second injection site was  1.5 cm to 3 cm up from the first injection site in line with the tragus of the ear. The third injection site was 1.5-3 cm forward between the first 2 injection sites. The fourth injection site was 1.5 cm posterior to  the second injection site.   -Occipitalis muscle injection, 3 sites, bilaterally. The first injection was done one half way between the occipital protuberance and the tip of the mastoid process behind the ear. The second injection site was done lateral and superior to the first, 1 fingerbreadth from the first injection. The third injection site was 1 fingerbreadth superiorly and medially from the first injection site.  -Cervical paraspinal muscle injection, 2 sites, bilateral knee first injection site was 1 cm from the midline of the cervical spine, 3 cm inferior to the lower border of the occipital protuberance. The second injection site was 1.5 cm superiorly and laterally to the first injection site.  -Trapezius muscle injection was performed at 3 sites, bilaterally. The first injection site was in the upper trapezius muscle halfway between the inflection point of the neck, and the acromion. The second injection site was one half way between the acromion and the first injection site. The third injection was done between the first injection site and the inflection point of the neck.   Will return for repeat injection in 3 months.   200 units of Botox was used, 45 U Botox not injected was wasted. The patient tolerated the procedure well, there were no complications of the above procedure.

## 2022-08-21 NOTE — Progress Notes (Signed)
Botox- 200 units x 1 vial Lot: [id number] Expiration: 08/2024 NDC: 4540-9811-91  Bacteriostatic 0.9% Sodium Chloride- 4 mL  Lot: [id number] Expiration: 06/11/2023 NDC: 6213-0865-78  Dx: I69.629 B/B Witnessed by Delmer Islam

## 2022-08-28 ENCOUNTER — Telehealth: Payer: Self-pay | Admitting: Neurology

## 2022-08-28 ENCOUNTER — Other Ambulatory Visit: Payer: Self-pay | Admitting: Neurology

## 2022-08-28 MED ORDER — UPNEEQ 0.1 % OP SOLN: 1.0000 [drp] | Freq: Every day | OPHTHALMIC | 0 refills | Status: AC | PRN

## 2022-08-28 NOTE — Telephone Encounter (Signed)
Call patient and let her know her upneeq samples are up fron thanks

## 2022-08-28 NOTE — Telephone Encounter (Signed)
Oxymetazoline HCl (UPNEEQ) 0.1 % solution samples left up front.

## 2022-08-29 NOTE — Telephone Encounter (Signed)
LVM informing pt of Upneeq samples left up front for patient

## 2022-09-07 ENCOUNTER — Encounter (INDEPENDENT_AMBULATORY_CARE_PROVIDER_SITE_OTHER): Payer: 59 | Admitting: Ophthalmology

## 2022-10-05 ENCOUNTER — Encounter: Payer: Self-pay | Admitting: Neurology

## 2022-10-08 ENCOUNTER — Encounter: Payer: Self-pay | Admitting: *Deleted

## 2022-10-22 ENCOUNTER — Encounter: Payer: Self-pay | Admitting: Family Medicine

## 2022-11-12 ENCOUNTER — Encounter: Payer: Self-pay | Admitting: Neurology

## 2022-11-13 ENCOUNTER — Ambulatory Visit: Payer: 59 | Admitting: Neurology

## 2022-11-13 NOTE — Telephone Encounter (Signed)
Please postpone per Dr Lucia Gaskins.

## 2023-01-24 NOTE — Progress Notes (Signed)
[facility] - Clinic Note  01/30/2023     CHIEF COMPLAINT Patient presents for Retina Follow Up  HISTORY OF PRESENT ILLNESS: Caitlin Duarte is a 37 y.o. female who presents to the clinic today for:   HPI     Retina Follow Up   In both eyes.  This started 6 months ago.  Duration of 6 months.  Since onset it is stable.  I, the attending physician,  performed the HPI with the patient and updated documentation appropriately.        Comments   6 month retina follow up uveitis OU pt is reporting no vision changes noticed she denies flashes has floaters every now and then       Last edited by Rennis Chris, MD on 02/02/2023  2:16 AM.    Patient states the eyes have been dry -- using Restasis.  Referring physician: Alma Downs, PA-C [address] [place],  [place] [address]  HISTORICAL INFORMATION:   Selected notes from the MEDICAL RECORD NUMBER Referred by Alma Downs concern of inferior retinal breaks OS   CURRENT MEDICATIONS: Current Outpatient Medications (Ophthalmic Drugs)  Medication Sig   Oxymetazoline HCl (UPNEEQ) 0.1 % SOLN Apply 1 drop to eye daily as needed.   RESTASIS 0.05 % ophthalmic emulsion 1 drop 2 (two) times daily.   No current facility-administered medications for this visit. (Ophthalmic Drugs)   Current Outpatient Medications (Other)  Medication Sig   levocetirizine (XYZAL) 5 MG tablet Take 5 mg by mouth every evening.   rizatriptan (MAXALT-MLT) 10 MG disintegrating tablet DISSOLVE ONE TABLET ON TONGUE AT ONSET OF HEADACHE. MAY REPEAT DOSE WITH ONE TABLET IN TWO HOURS IF NEEDED. DO NOT EXCEED TWO TABLETS IN 24 HOURS.   UNABLE TO FIND Med Name: Compound eye drop for ocular rosacea (steriod) Dexamethasone  PF 0.01%   No current facility-administered medications for this visit. (Other)   REVIEW OF SYSTEMS: ROS   Positive for: Gastrointestinal, Eyes Negative for: Constitutional, Neurological, Skin, Genitourinary,  Musculoskeletal, HENT, Endocrine, Cardiovascular, Respiratory, Psychiatric, Allergic/Imm, Heme/Lymph Last edited by Etheleen Mayhew, COT on 01/30/2023  2:44 PM.      ALLERGIES Allergies  Allergen Reactions   Other Anaphylaxis and Shortness Of Breath    curry   PAST MEDICAL HISTORY Past Medical History:  Diagnosis Date   Allergy    Asthma    ?allergy induced asthma   Migraines    Seasonal allergies    Past Surgical History:  Procedure Laterality Date   APPENDECTOMY  2008   EYE MUSCLE SURGERY  1997   NOSE SURGERY  2006   RETINAL LASER PROCEDURE     SINUS SURGERY WITH INSTATRAK     TONSILLECTOMY  2020   WISDOM TOOTH EXTRACTION  2006   FAMILY HISTORY Family History  Problem Relation Age of Onset   Hypertension Father    Diabetes Father    Diverticulitis Father    Anemia Sister    Thyroid disease Sister    Headache Sister        ?migraines   Diverticulitis Sister    Colon polyps Sister    Alzheimer's disease Maternal Grandfather    Stroke Maternal Grandfather    Diabetes Paternal Grandmother    Hypertension Paternal Grandmother    Heart disease Paternal Grandmother    Heart attack Paternal Grandmother    Diabetes Paternal Grandfather    Hypertension Paternal Grandfather    Colon cancer Neg Hx  Pancreatic cancer Neg Hx    Esophageal cancer Neg Hx    Liver cancer Neg Hx    Stomach cancer Neg Hx    SOCIAL HISTORY Social History   Tobacco Use   Smoking status: Never   Smokeless tobacco: Never  Vaping Use   Vaping status: Never Used  Substance Use Topics   Alcohol use: Never    Comment: socially   Drug use: No       OPHTHALMIC EXAM:  Base Eye Exam     Visual Acuity (Snellen - Linear)       Right Left   Dist cc 20/25 20/20 -2   Dist ph cc 20/20 -1     Correction: Contacts         Tonometry (Tonopen, 2:50 PM)       Right Left   Pressure 16 13         Pupils       Pupils Dark Light Shape React APD   Right PERRL 3 2 Round  Brisk None   Left PERRL 3 2 Round Brisk None         Visual Fields       Left Right    Full Full         Extraocular Movement       Right Left    Full, Ortho Full, Ortho         Neuro/Psych     Oriented x3: Yes   Mood/Affect: Normal         Dilation     Both eyes: 2.5% Phenylephrine @ 2:50 PM           Slit Lamp and Fundus Exam     Slit Lamp Exam       Right Left   Lids/Lashes Normal Normal   Conjunctiva/Sclera White and quiet White and quiet   Cornea Clear Clear   Anterior Chamber deep and clear, no cell or flare deep and clear, no cell or flare   Iris Round and dilated Round and dilated   Lens Clear Clear   Anterior Vitreous Vitreous syneresis clear         Fundus Exam       Right Left   Disc Pink and sharp, Compact Pink and sharp   C/D Ratio 0.3 0.2   Macula Flat, Good foveal reflex, No heme or edema Flat, Good foveal reflex; pigmented subfoveal choroidal nevus (approx 1DD) with stable resolution in focal SRF   Vessels Normal mild attenuation, mild tortuosity   Periphery Attached, Multiple patches of lattice from 1100-0100, patch of lattice from 0500-0730 -- good laser surrounding all lesions; no new RT/RD or lattice Attached, multiple patches of lattice from 11:00-0300, pigmented lattice at 4:30, 5:00, and 6:00 with atrophic holes - good laser changes surrounding all lesions; no new RT/RD or lattice           Refraction     Wearing Rx       Sphere Cylinder Axis   Right -7.75 +2.25 012   Left -9.00 +4.00 171            IMAGING AND PROCEDURES  Imaging and Procedures for @TODAY @  OCT, Retina - OU - Both Eyes       Right Eye Quality was good. Central Foveal Thickness: 274. Progression has been stable. Findings include normal foveal contour, no IRF, no SRF, vitreomacular adhesion .   Left Eye Quality was good. Central Foveal Thickness: 333. Progression has been stable.  Findings include normal foveal contour, no IRF, no SRF,  retinal drusen , vitreomacular adhesion (subfoveal choroidal hyper-reflective lesion with elevation and overlying drusen -- stable; stable resolution of SRF overlying lesion).   Notes *Images captured and stored on drive  Diagnosis / Impression:  NFP; no IRF/SRF OU OS: subfoveal choroidal hyper-reflective lesion with elevation and overlying drusen-- stable; stable resolution of SRF overlying lesion  Clinical management:  See below  Abbreviations: NFP - Normal foveal profile. CME - cystoid macular edema. PED - pigment epithelial detachment. IRF - intraretinal fluid. SRF - subretinal fluid. EZ - ellipsoid zone. ERM - epiretinal membrane. ORA - outer retinal atrophy. ORT - outer retinal tubulation. SRHM - subretinal hyper-reflective material            ASSESSMENT/PLAN:    ICD-10-CM   1. Lattice degeneration of both retinas  H35.413 OCT, Retina - OU - Both Eyes    2. Ocular rosacea  L71.8     3. Scleritis and episcleritis of both eyes  H15.003    H15.103     4. Retinal hole of both eyes  H33.323 OCT, Retina - OU - Both Eyes    5. Choroidal nevus of left eye  D31.32 OCT, Retina - OU - Both Eyes    6. Serous retinal detachment of left eye  H33.22     7. Myopia of both eyes with astigmatism  H52.13    H52.203     8. History of strabismus surgery  Z98.890       1,2. Lattice degeneration w/ atrophic holes, both eyes  - OD: patches of lattice from 1130-12 and at 0730  - OS: patches of lattice from 11-130 and 0430-0600  - s/p laser retinopexy OS (07.31.20) - good laser changes surrounding all lesions  - s/p laser retinopexy OD (08.07.20) - good laser surrounding all lesions - new development of lattice degeneration at 1245 and 0200-0300 -- s/p laser retinopexy (06.17.22) -- good laser changes in place  - no new RT/RD or lattice  - f/u 9 months, DFE, OCT  3,4. Ocular rosacea w/ history of scleritis/episcleritis OU  - first flare in Dec 2023 -- multiple recurrences  - managed  by Dr[facility] Jackie [facility]um at [facility]  - patient finished Doxycycline and topical preservative-free Dexamethasone  - failed Indomethacin tx  - exam today with BCVA 20/20 OU; no inflammation on exam  5,6. Choroidal nevus OS with history of +SRF/focal serous RD  - relatively flat, pigmented, subfoveal nevus  - ~1DD in size  - no orange pigment or drusen  - focal SRF noted on exam 8.6.21 overlying nevus --stably resolved today - now under the expert management of Dr. Pearletha Furl, who has treated w/ IVA x3 (08.12.21, 09.15.21, 10.19.21) - OCT shows subfoveal hyperreflective choroidal lesion with stable improvement in SRF/edema -- SRF stably resolved  7. High myopia w/ astigmatism OU - discussed association of high myopia with lattice degeneration and increased risk of RT/RD  - RD warnings given - Patient has family history of high myopia (Mother and maternal Grandmother--Grandmother has history of RD and Fuchs Dystrophy--history of cornea transplant )  8.  Hx of strabismus surgery  - hx of muscle surgery at age 28 with Dr. Hortense Ramal (20/20-)  Ophthalmic Meds Ordered this visit:  No orders of the defined types were placed in this encounter.    Return in about 9 months (around 10/30/2023) for f/u [place] OU, DFE, OCT.  There are no Patient Instructions on file for this visit.  Explained the diagnoses, plan, and follow up with the patient and they expressed understanding.  Patient expressed understanding of the importance of proper follow up care.   This document serves as a record of services personally performed by Karie Chimera, MD, PhD. It was created on their behalf by De Blanch, an ophthalmic technician. The creation of this record is the provider's dictation and/or activities during the visit.    Electronically signed by: De Blanch, OA, 02/02/23  2:20 AM  This document serves as a record of services personally performed by Karie Chimera, MD, PhD. It was created on their behalf by Charlette Caffey, COT an ophthalmic technician. The creation of this record is the provider's dictation and/or activities during the visit.    Electronically signed by:  Charlette Caffey, COT  02/02/23 2:20 AM  Karie Chimera, M.D., Ph.D. Diseases & Surgery of the Retina and Vitreous [facility]  I have reviewed the above documentation for accuracy and completeness, and I agree with the above. Karie Chimera, M.D., Ph.D. 02/02/23 2:23 AM  Abbreviations: M myopia (nearsighted); A astigmatism; H hyperopia (farsighted); P presbyopia; Mrx spectacle prescription;  CTL contact lenses; OD right eye; OS left eye; OU both eyes  XT exotropia; ET esotropia; PEK punctate epithelial keratitis; PEE punctate epithelial erosions; DES dry eye syndrome; MGD meibomian gland dysfunction; ATs artificial tears; PFAT's preservative free artificial tears; NSC nuclear sclerotic cataract; PSC posterior subcapsular cataract; ERM epi-retinal membrane; PVD posterior vitreous detachment; RD retinal detachment; DM diabetes mellitus; DR diabetic retinopathy; NPDR non-proliferative diabetic retinopathy; PDR proliferative diabetic retinopathy; CSME clinically significant macular edema; DME diabetic macular edema; dbh dot blot hemorrhages; CWS cotton wool spot; POAG primary open angle glaucoma; C/D cup-to-disc ratio; HVF humphrey visual field; GVF goldmann visual field; OCT optical coherence tomography; IOP intraocular pressure; BRVO Branch retinal vein occlusion; CRVO central retinal vein occlusion; CRAO central retinal artery occlusion; BRAO branch retinal artery occlusion; RT retinal tear; SB scleral buckle; PPV pars plana vitrectomy; VH Vitreous hemorrhage; PRP panretinal laser photocoagulation; IVK intravitreal kenalog; VMT vitreomacular traction; MH Macular hole;  NVD neovascularization of the disc; NVE neovascularization elsewhere; AREDS age related eye disease study; ARMD age related macular degeneration; POAG  primary open angle glaucoma; EBMD epithelial/anterior basement membrane dystrophy; ACIOL anterior chamber intraocular lens; IOL intraocular lens; PCIOL posterior chamber intraocular lens; Phaco/IOL phacoemulsification with intraocular lens placement; PRK photorefractive keratectomy; LASIK laser assisted in situ keratomileusis; HTN hypertension; DM diabetes mellitus; COPD chronic obstructive pulmonary disease

## 2023-01-30 ENCOUNTER — Ambulatory Visit (INDEPENDENT_AMBULATORY_CARE_PROVIDER_SITE_OTHER): Payer: 59 | Admitting: Ophthalmology

## 2023-01-30 ENCOUNTER — Encounter (INDEPENDENT_AMBULATORY_CARE_PROVIDER_SITE_OTHER): Payer: Self-pay | Admitting: Ophthalmology

## 2023-01-30 DIAGNOSIS — D3132 Benign neoplasm of left choroid: Secondary | ICD-10-CM

## 2023-01-30 DIAGNOSIS — H15003 Unspecified scleritis, bilateral: Secondary | ICD-10-CM | POA: Diagnosis not present

## 2023-01-30 DIAGNOSIS — L718 Other rosacea: Secondary | ICD-10-CM

## 2023-01-30 DIAGNOSIS — H52203 Unspecified astigmatism, bilateral: Secondary | ICD-10-CM

## 2023-01-30 DIAGNOSIS — H3322 Serous retinal detachment, left eye: Secondary | ICD-10-CM | POA: Diagnosis not present

## 2023-01-30 DIAGNOSIS — H15103 Unspecified episcleritis, bilateral: Secondary | ICD-10-CM

## 2023-01-30 DIAGNOSIS — H35413 Lattice degeneration of retina, bilateral: Secondary | ICD-10-CM

## 2023-01-30 DIAGNOSIS — H33323 Round hole, bilateral: Secondary | ICD-10-CM

## 2023-01-30 DIAGNOSIS — Z9889 Other specified postprocedural states: Secondary | ICD-10-CM

## 2023-02-02 ENCOUNTER — Encounter (INDEPENDENT_AMBULATORY_CARE_PROVIDER_SITE_OTHER): Payer: Self-pay | Admitting: Ophthalmology

## 2023-03-15 ENCOUNTER — Telehealth: Payer: Self-pay | Admitting: Family Medicine

## 2023-03-15 DIAGNOSIS — G43709 Chronic migraine without aura, not intractable, without status migrainosus: Secondary | ICD-10-CM

## 2023-03-15 NOTE — Telephone Encounter (Signed)
 Pt states she is needing a botox appt for her migraines. No availability until March and would like to be seen sooner. Requesting call back

## 2023-03-18 NOTE — Telephone Encounter (Signed)
 Called pt and rescheduled with Dr. Lucia Gaskins for 04/11/23.

## 2023-03-20 ENCOUNTER — Encounter: Payer: Self-pay | Admitting: Internal Medicine

## 2023-03-20 ENCOUNTER — Ambulatory Visit: Payer: 59 | Admitting: Internal Medicine

## 2023-03-20 VITALS — BP 110/80 | HR 95 | Temp 98.0°F | Ht 64.0 in | Wt 135.8 lb

## 2023-03-20 DIAGNOSIS — J988 Other specified respiratory disorders: Secondary | ICD-10-CM | POA: Diagnosis not present

## 2023-03-20 MED ORDER — AZITHROMYCIN 250 MG PO TABS
ORAL_TABLET | ORAL | 0 refills | Status: AC
Start: 2023-03-20 — End: 2023-03-25

## 2023-03-20 MED ORDER — ALBUTEROL SULFATE HFA 108 (90 BASE) MCG/ACT IN AERS
2.0000 | INHALATION_SPRAY | Freq: Four times a day (QID) | RESPIRATORY_TRACT | 2 refills | Status: DC | PRN
Start: 2023-03-20 — End: 2023-09-18

## 2023-03-20 MED ORDER — PREDNISONE 10 MG (21) PO TBPK
ORAL_TABLET | ORAL | 0 refills | Status: DC
Start: 2023-03-20 — End: 2023-09-18

## 2023-03-20 NOTE — Progress Notes (Signed)
 [facility] [address] [place] [address] Dept: [phone] Dept Fax: [phone]  Acute Care Office Visit  Subjective:   Caitlin Duarte [birth year] 03/20/2023  Chief Complaint  Patient presents with   Cough    Started a month ago     HPI: Discussed the use of AI scribe software for clinical note transcription with the patient, who gave verbal consent to proceed.  History of Present Illness   The patient presents with a persistent cough and dark yellow phlegm production x 1 month. The cough is particularly noticeable in the morning and at night.  She denies experiencing fever, chills, shortness of breath, chest pain, or wheezing. The patient's child did have pneumonia recently.  The patient also reports occasional sinus congestion, but denies experiencing headaches or earaches. The patient has tried various over-the-counter medications, including Sudafed, Afrin,  and Delsym, but the cough persists.        The following portions of the patient's history were reviewed and updated as appropriate: past medical history, past surgical history, family history, social history, allergies, medications, and problem list.   Patient Active Problem List   Diagnosis Date Noted   Trapezius strain, right, initial encounter 07/18/2021   Cervical strain 07/18/2021   Chronic nonspecific colitis 06/13/2021   Hepatic steatosis 06/13/2021   Right nephrolithiasis 06/13/2021   Other specified retinal disorders 10/20/2019   Chronic migraine without aura without status migrainosus, not intractable 03/05/2018   Allergic rhinitis 09/07/2013   Atypical nevus 09/07/2013   Past Medical History:  Diagnosis Date   Allergy     Asthma    ?allergy  induced asthma   Migraines    Seasonal allergies    Past Surgical History:  Procedure Laterality Date   APPENDECTOMY  2008   EYE MUSCLE SURGERY  1997   NOSE SURGERY  2006   RETINAL LASER PROCEDURE      SINUS SURGERY WITH INSTATRAK     TONSILLECTOMY  2020   WISDOM TOOTH EXTRACTION  2006   Family History  Problem Relation Age of Onset   Hypertension Father    Diabetes Father    Diverticulitis Father    Anemia Sister    Thyroid  disease Sister    Headache Sister        ?migraines   Diverticulitis Sister    Colon polyps Sister    Alzheimer's disease Maternal Grandfather    Stroke Maternal Grandfather    Diabetes Paternal Grandmother    Hypertension Paternal Grandmother    Heart disease Paternal Grandmother    Heart attack Paternal Grandmother    Diabetes Paternal Grandfather    Hypertension Paternal Grandfather    Colon cancer Neg Hx    Pancreatic cancer Neg Hx    Esophageal cancer Neg Hx    Liver cancer Neg Hx    Stomach cancer Neg Hx     Current Outpatient Medications:    albuterol  (VENTOLIN  HFA) 108 (90 Base) MCG/ACT inhaler, Inhale 2 puffs into the lungs every 6 (six) hours as needed for wheezing or shortness of breath., Disp: 18 g, Rfl: 2   azithromycin  (ZITHROMAX ) 250 MG tablet, Take 2 tablets on day 1, then 1 tablet daily on days 2 through 5, Disp: 6 tablet, Rfl: 0   Oxymetazoline  HCl (UPNEEQ ) 0.1 % SOLN, Apply 1 drop to eye daily as needed. (Patient not taking: Reported on 03/20/2023), Disp: 18 each, Rfl: 0   predniSONE  (STERAPRED UNI-PAK 21 TAB) 10 MG (21) TBPK tablet, Take  as directed on packaging., Disp: 21 tablet, Rfl: 0   RESTASIS 0.05 % ophthalmic emulsion, 1 drop 2 (two) times daily., Disp: , Rfl:    rizatriptan  (MAXALT -MLT) 10 MG disintegrating tablet, DISSOLVE ONE TABLET ON TONGUE AT ONSET OF HEADACHE. MAY REPEAT DOSE WITH ONE TABLET IN TWO HOURS IF NEEDED. DO NOT EXCEED TWO TABLETS IN 24 HOURS., Disp: 12 tablet, Rfl: 11   UNABLE TO FIND, Med Name: Compound eye drop for ocular rosacea (steriod) Dexamethasone  PF 0.01%, Disp: , Rfl:    levocetirizine (XYZAL) 5 MG tablet, Take 5 mg by mouth every evening. (Patient not taking: Reported on 03/20/2023), Disp: , Rfl:   Allergies  Allergen Reactions   Other Anaphylaxis and Shortness Of Breath    curry     ROS: A complete ROS was performed with pertinent positives/negatives noted in the HPI. The remainder of the ROS are negative.    Objective:   Today's Vitals   03/20/23 1518  BP: 110/80  Pulse: 95  Temp: 98 F (36.7 C)  TempSrc: Temporal  SpO2: 98%  Weight: 135 lb 12.8 oz (61.6 kg)  Height: 5' 4 (1.626 m)    GENERAL: Well-appearing, in NAD. Well nourished.  SKIN: Pink, warm and dry. No rash.  HEENT:    HEAD: Normocephalic, non-traumatic.  EYES: Conjunctive pink without exudate. PERRL.  EARS: External ear w/o redness, swelling, masses, or lesions. EAC clear. TM's intact, translucent w/o bulging, appropriate landmarks visualized.  NOSE: Septum midline w/o deformity. Nares patent, mucosa pink and non-inflamed w/o drainage. No sinus tenderness.  THROAT: Uvula midline. Oropharynx clear. Tonsils absent. Mucus membranes pink and moist.  NECK: Trachea midline. Full ROM w/o pain or tenderness. No lymphadenopathy.  RESPIRATORY: Chest wall symmetrical. Respirations even and non-labored. Breath sounds clear to auscultation bilaterally. (+)cough CARDIAC: S1, S2 present, regular rate and rhythm. Peripheral pulses 2+ bilaterally.  EXTREMITIES: Without clubbing, cyanosis, or edema.  NEUROLOGIC: Steady, even gait.  PSYCH/MENTAL STATUS: Alert, oriented x 3. Cooperative, appropriate mood and affect.    No results found for any visits on 03/20/23.    Assessment & Plan:  Assessment and Plan    Respiratory Infection -Start Azithromycin  (Z-Pak), Albuterol  inhaler, prednisone  steroid pack -Consider OTC Mucinex DM to suppress cough and thin mucus. -If symptoms persist, consider chest x-ray.       Meds ordered this encounter  Medications   albuterol  (VENTOLIN  HFA) 108 (90 Base) MCG/ACT inhaler    Sig: Inhale 2 puffs into the lungs every 6 (six) hours as needed for wheezing or shortness of breath.     Dispense:  18 g    Refill:  2    Supervising Provider:   THOMPSON, AARON B [[id number]]   azithromycin  (ZITHROMAX ) 250 MG tablet    Sig: Take 2 tablets on day 1, then 1 tablet daily on days 2 through 5    Dispense:  6 tablet    Refill:  0    Supervising Provider:   THOMPSON, AARON B [[id number]]   predniSONE  (STERAPRED UNI-PAK 21 TAB) 10 MG (21) TBPK tablet    Sig: Take as directed on packaging.    Dispense:  21 tablet    Refill:  0    Supervising Provider:   THOMPSON, AARON B [[id number]]   No orders of the defined types were placed in this encounter.  Lab Orders  No laboratory test(s) ordered today   No images are attached to the encounter or orders placed in the encounter.  Return if symptoms worsen  or fail to improve.   Rosina Senters, FNP

## 2023-04-10 NOTE — Telephone Encounter (Signed)
Pt called in needing to cancel 1/30 appt. Currently, there are no openings with Dr. Lucia Gaskins or NP until April.  Is there somewhere I can put her in the next few weeks? I'm not sure if you'd want to add on a 3:30?

## 2023-04-11 ENCOUNTER — Ambulatory Visit: Payer: 59 | Admitting: Neurology

## 2023-04-14 NOTE — Telephone Encounter (Signed)
February 14th or feb 27th I can add on I'n in the office anyway

## 2023-04-26 ENCOUNTER — Ambulatory Visit: Payer: 59 | Admitting: Neurology

## 2023-05-20 ENCOUNTER — Ambulatory Visit: Payer: 59 | Admitting: Neurology

## 2023-06-13 ENCOUNTER — Ambulatory Visit: Payer: 59 | Admitting: Neurology

## 2023-08-20 MED ORDER — ONABOTULINUMTOXINA 200 UNITS IJ SOLR: INTRAMUSCULAR | 3 refills | Status: AC

## 2023-08-20 NOTE — Telephone Encounter (Signed)
 Submitted auth via CMM, request was denied due to not being a covered benefit under pt's PBM. Submitted auth request via [employer] portal to medical benefit, received approval. Please send rx to [facility], thank you!  Auth#: [id number] (08/20/23-08/19/24)

## 2023-08-20 NOTE — Addendum Note (Signed)
 Addended by: Burns Carwin on: 08/20/2023 07:37 AM   Modules accepted: Orders

## 2023-08-20 NOTE — Telephone Encounter (Signed)
 Botox 200 unit Rx sent to [facility].

## 2023-08-30 ENCOUNTER — Ambulatory Visit: Admitting: Physician Assistant

## 2023-08-30 ENCOUNTER — Encounter: Payer: Self-pay | Admitting: Physician Assistant

## 2023-08-30 VITALS — BP 118/68 | HR 93 | Ht 64.0 in | Wt 138.0 lb

## 2023-08-30 DIAGNOSIS — K625 Hemorrhage of anus and rectum: Secondary | ICD-10-CM | POA: Diagnosis not present

## 2023-08-30 DIAGNOSIS — K648 Other hemorrhoids: Secondary | ICD-10-CM | POA: Diagnosis not present

## 2023-08-30 MED ORDER — HYDROCORTISONE ACETATE 25 MG RE SUPP: 25.0000 mg | Freq: Two times a day (BID) | RECTAL | 1 refills | Status: DC

## 2023-08-30 NOTE — Patient Instructions (Signed)
 We have sent the following medications to your pharmacy for you to pick up at your convenience: Hydrocortisone suppository twice a day for 14 days.  Follow up as needed.  Thank you for trusting me with your gastrointestinal care!  Reginal Capra, PA-C  _______________________________________________________  If your blood pressure at your visit was 140/90 or greater, please contact your primary care physician to follow up on this.  _______________________________________________________  If you are age 2 or older, your body mass index should be between 23-30. Your Body mass index is 23.69 kg/m. If this is out of the aforementioned range listed, please consider follow up with your Primary Care Provider.  If you are age 3 or younger, your body mass index should be between 19-25. Your Body mass index is 23.69 kg/m. If this is out of the aformentioned range listed, please consider follow up with your Primary Care Provider.   ________________________________________________________  The [facility] providers would like to encourage you to use MYCHART to communicate with providers for non-urgent requests or questions.  Due to long hold times on the telephone, sending your provider a message by [facility] may be a faster and more efficient way to get a response.  Please allow 48 business hours for a response.  Please remember that this is for non-urgent requests.  _______________________________________________________

## 2023-08-30 NOTE — Progress Notes (Signed)
 Chief Complaint: Hemorrhoids  HPI:    Caitlin Duarte is a 38 year old female, known to Dr. Leonia Raman, with a past medical history as listed below, who presents to clinic today for hemorrhoids.    02/21/2021 patient initially seen in clinic after being in the ER with abnormal CT showing irregular thickening of the rectum with adjacent abnormal number of lymph nodes as well as abdominal pain and diarrhea.  She was arranged for stool studies and given a 7-day course of Cipro  and Flagyl .    02/22/2021 stool studies were negative.    03/08/2021 colonoscopy with Dr. Savannah Curlin with focal erythema in the rectum, erythematous mucosa at the appendiceal orifice and mild diverticulosis in the ascending colon.  Biopsy showed active colitis in the a sending colon which was nonspecific.  Patient started on Budesonide  9 mg daily for 30 days followed by a taper of 6 mg daily for 2 weeks and then 3 mg daily for an additional 2 weeks.  Also given Dicyclomine  10 mg every 8 hours for abdominal cramping.    Today, patient presents to clinic and tells me that she has had hemorrhoids ever since she had children, but they typically did not bother her and would just kind of come and go.  Now more often she is having occasional bleeding from her hemorrhoids especially if she strains, but even if she does not, and sometimes it is more than just a spot of blood on the toilet paper.  She has no other symptoms no itching or pain, but is having often enough that she wants to make sure that it is okay.  She has tried over-the-counter Tucks pads but not much else.  Her bowel movements are fairly normal and her GI system is doing well otherwise.    Denies fever, chills or weight loss.  Past Medical History:  Diagnosis Date   Allergy     Asthma    ?allergy  induced asthma   Migraines    Rosacea conjunctivitis of both eyes    Seasonal allergies     Past Surgical History:  Procedure Laterality Date   APPENDECTOMY  2008   EYE MUSCLE  SURGERY  1997   NOSE SURGERY  2006   RETINAL LASER PROCEDURE     SINUS SURGERY WITH INSTATRAK     TONSILLECTOMY  2020   WISDOM TOOTH EXTRACTION  2006    Current Outpatient Medications  Medication Sig Dispense Refill   botulinum toxin Type A  (BOTOX ) 200 units injection Provider to inject 155 units into the muscles of the head and neck every 12 weeks. Discard remainder. 1 each 3   levocetirizine (XYZAL) 5 MG tablet Take 5 mg by mouth every evening.     Oxymetazoline  HCl (UPNEEQ ) 0.1 % SOLN Apply 1 drop to eye daily as needed. 18 each 0   RESTASIS 0.05 % ophthalmic emulsion 1 drop 2 (two) times daily.     rizatriptan  (MAXALT -MLT) 10 MG disintegrating tablet DISSOLVE ONE TABLET ON TONGUE AT ONSET OF HEADACHE. MAY REPEAT DOSE WITH ONE TABLET IN TWO HOURS IF NEEDED. DO NOT EXCEED TWO TABLETS IN 24 HOURS. 12 tablet 11   UNABLE TO FIND Med Name: Compound eye drop for ocular rosacea (steriod) Dexamethasone  PF 0.01%     albuterol  (VENTOLIN  HFA) 108 (90 Base) MCG/ACT inhaler Inhale 2 puffs into the lungs every 6 (six) hours as needed for wheezing or shortness of breath. (Patient not taking: Reported on 08/30/2023) 18 g 2   predniSONE  (STERAPRED UNI-PAK 21 TAB)  10 MG (21) TBPK tablet Take as directed on packaging. 21 tablet 0   No current facility-administered medications for this visit.    Allergies as of 08/30/2023 - Review Complete 03/20/2023  Allergen Reaction Noted   Other Anaphylaxis and Shortness Of Breath 09/15/2012    Family History  Problem Relation Age of Onset   Hypertension Father    Diabetes Father    Diverticulitis Father    Anemia Sister    Thyroid  disease Sister    Headache Sister        ?migraines   Diverticulitis Sister    Colon polyps Sister    Alzheimer's disease Maternal Grandfather    Stroke Maternal Grandfather    Diabetes Paternal Grandmother    Hypertension Paternal Grandmother    Heart disease Paternal Grandmother    Heart attack Paternal Grandmother     Diabetes Paternal Grandfather    Hypertension Paternal Grandfather    Colon cancer Neg Hx    Pancreatic cancer Neg Hx    Esophageal cancer Neg Hx    Liver cancer Neg Hx    Stomach cancer Neg Hx     Social History   Socioeconomic History   Marital status: Married    Spouse name: Not on file   Number of children: 2   Years of education: Not on file   Highest education level: Bachelor's degree (e.g., BA, AB, BS)  Occupational History   Occupation: Librarian    Comment: [employer]  Tobacco Use   Smoking status: Never   Smokeless tobacco: Never  Vaping Use   Vaping status: Never Used  Substance and Sexual Activity   Alcohol use: Never    Comment: socially   Drug use: No   Sexual activity: Yes    Birth control/protection: None  Other Topics Concern   Not on file  Social History Narrative   Lives at home with her husband and 2 sons   Right handed    Caffeine: 1 cup daily   Social Drivers of Corporate investment banker Strain: Not on file  Food Insecurity: Not on file  Transportation Needs: Not on file  Physical Activity: Not on file  Stress: Not on file  Social Connections: Unknown (07/18/2021)   Received from [facility]   Social Network    Social Network: Not on file  Intimate Partner Violence: Unknown (06/14/2021)   Received from [facility]   HITS    Physically Hurt: Not on file    Insult or Talk Down To: Not on file    Threaten Physical Harm: Not on file    Scream or Curse: Not on file    Review of Systems:    Constitutional: No weight loss, fever or chills Cardiovascular: No chest pain Respiratory: No SOB Gastrointestinal: See HPI and otherwise negative   Physical Exam:  Vital signs: BP 118/68   Pulse 93   Ht 5' 4 (1.626 m)   Wt 138 lb (62.6 kg)   BMI 23.69 kg/m    Constitutional:   Pleasant Caucasian female appears to be in NAD, Well developed, Well nourished, alert and cooperative Respiratory: Respirations even and unlabored.  Lungs clear to auscultation bilaterally.   No wheezes, crackles, or rhonchi.  Cardiovascular: Normal S1, S2. No MRG. Regular rate and rhythm. No peripheral edema, cyanosis or pallor.  Gastrointestinal:  Soft, nondistended, nontender. No rebound or guarding. Normal bowel sounds. No appreciable masses or hepatomegaly. Rectal: External: Some hemorrhoid tissue, nonthrombosed; internal: No mass, no tenderness;  and anoscopy: Grade 2-3 hemorrhoids in the left lateral position with visible stigmata of bleeding, otherwise grade 1 hemorrhoids Psychiatric: Oriented to person, place and time. Demonstrates good judgement and reason without abnormal affect or behaviors.  RELEVANT LABS AND IMAGING: CBC    Component Value Date/Time   WBC 10.2 06/13/2021 1550   RBC 4.22 06/13/2021 1550   HGB 12.9 06/13/2021 1550   HGB 13.4 02/26/2018 1538   HGB 14.2 10/27/2013 1009   HCT 38.3 06/13/2021 1550   HCT 37.2 02/26/2018 1538   PLT 341.0 06/13/2021 1550   PLT 509 (H) 02/26/2018 1538   MCV 90.8 06/13/2021 1550   MCV 84 02/26/2018 1538   MCH 30.1 02/26/2018 1538   MCH 30.6 02/28/2017 0517   MCHC 33.8 06/13/2021 1550   RDW 13.1 06/13/2021 1550   RDW 12.3 02/26/2018 1538   LYMPHSABS 2.5 02/15/2021 1615   MONOABS 1.2 (H) 02/15/2021 1615   EOSABS 0.1 02/15/2021 1615   BASOSABS 0.1 02/15/2021 1615    CMP     Component Value Date/Time   NA 137 02/15/2021 1615   NA 145 (H) 02/26/2018 1538   K 3.9 02/15/2021 1615   CL 103 02/15/2021 1615   CO2 25 02/15/2021 1615   GLUCOSE 76 02/15/2021 1615   BUN 13 02/15/2021 1615   BUN 12 02/26/2018 1538   CREATININE 0.67 02/15/2021 1615   CREATININE 0.65 06/20/2016 1557   CALCIUM 9.7 02/15/2021 1615   PROT 6.8 02/15/2021 1615   PROT 6.6 02/26/2018 1538   ALBUMIN 4.8 02/15/2021 1615   ALBUMIN 4.8 02/26/2018 1538   AST 19 02/15/2021 1615   ALT 22 02/15/2021 1615   ALKPHOS 62 02/15/2021 1615   BILITOT 0.5 02/15/2021 1615   BILITOT 0.3 02/26/2018 1538   GFRNONAA  118 02/26/2018 1538   GFRAA 136 02/26/2018 1538    Assessment: 1.  Internal hemorrhoids with bleeding: Irritating for the patient over the past few months, occurring more often, visualized on exam  Plan: 1.  Prescribed Hydrocortisone suppositories twice daily x 7 days #14 with 1 refill.  Discussed that she can use a refill as needed 2.  Encouraged her to keep regular soft solid bowel movements 3.  Did discuss possibility of banding in the future if needed. 4.  Patient to follow in clinic with us  as needed.  Caitlin Capra, PA-C [facility] 08/30/2023, 8:31 AM  Cc: Graig Lawyer, MD

## 2023-09-12 ENCOUNTER — Telehealth: Payer: Self-pay | Admitting: Neurology

## 2023-09-12 ENCOUNTER — Telehealth: Payer: Self-pay | Admitting: Family Medicine

## 2023-09-12 NOTE — Telephone Encounter (Signed)
 Gina @[employer] called for pt's new insurance information.  Phone rep relayed response from Botox  coordinator : she has left a vm asking pt to call and provide new insurance information.

## 2023-09-12 NOTE — Telephone Encounter (Signed)
 Pt called to give new ins number  Pt is under husband  Insurance  Anthem Redell Anes Date of birth [birth year]  Member ID # [id number] EFFECTIVE DATE  AS OF 08-28-23 Phone number [phone]

## 2023-09-16 NOTE — Telephone Encounter (Signed)
 Submitted auth request via CMM, status is pending. Key: [id number]

## 2023-09-17 NOTE — Telephone Encounter (Signed)
 Received approval from pt's new Anthem plan, she will be B/B for tomorrow's appt.  Auth#: [id number] (09/16/23-03/14/24)

## 2023-09-17 NOTE — Progress Notes (Unsigned)
 09/18/23 ALL: Caitlin Duarte returns for Botox . She was last seen by Dr Ines 08/2022 for first procedure and tolerated it well. Baseline >15 headache days with at least 10 being described as migrainous. Rizatriptan  helps but she has needed to take second dose more often. She does clench. Masseter injections were helpful at previous visit.     Consent Form Botulism Toxin Injection For Chronic Migraine    Reviewed orally with patient, additionally signature is on file:  Botulism toxin has been approved by the Federal drug administration for treatment of chronic migraine. Botulism toxin does not cure chronic migraine and it may not be effective in some patients.  The administration of botulism toxin is accomplished by injecting a small amount of toxin into the muscles of the neck and head. Dosage must be titrated for each individual. Any benefits resulting from botulism toxin tend to wear off after 3 months with a repeat injection required if benefit is to be maintained. Injections are usually done every 3-4 months with maximum effect peak achieved by about 2 or 3 weeks. Botulism toxin is expensive and you should be sure of what costs you will incur resulting from the injection.  The side effects of botulism toxin use for chronic migraine may include:   -Transient, and usually mild, facial weakness with facial injections  -Transient, and usually mild, head or neck weakness with head/neck injections  -Reduction or loss of forehead facial animation due to forehead muscle weakness  -Eyelid drooping  -Dry eye  -Pain at the site of injection or bruising at the site of injection  -Double vision  -Potential unknown long term risks   Contraindications: You should not have Botox  if you are pregnant, nursing, allergic to albumin, have an infection, skin condition, or muscle weakness at the site of the injection, or have myasthenia gravis, Lambert-Eaton syndrome, or ALS.  It is also possible that as with  any injection, there may be an allergic reaction or no effect from the medication. Reduced effectiveness after repeated injections is sometimes seen and rarely infection at the injection site may occur. All care will be taken to prevent these side effects. If therapy is given over a long time, atrophy and wasting in the muscle injected may occur. Occasionally the patient's become refractory to treatment because they develop antibodies to the toxin. In this event, therapy needs to be modified.  I have read the above information and consent to the administration of botulism toxin.    BOTOX  PROCEDURE NOTE FOR MIGRAINE HEADACHE  Contraindications and precautions discussed with patient(above). Aseptic procedure was observed and patient tolerated procedure. Procedure performed by Greig Forbes, FNP-C.   The condition has existed for more than 6 months, and pt does not have a diagnosis of ALS, Myasthenia Gravis or Lambert-Eaton Syndrome.  Risks and benefits of injections discussed and pt agrees to proceed with the procedure.  Written consent obtained  These injections are medically necessary. Pt  receives good benefits from these injections. These injections do not cause sedations or hallucinations which the oral therapies may cause.   Description of procedure:  The patient was placed in a sitting position. The standard protocol was used for Botox  as follows, with 5 units of Botox  injected at each site:  -Procerus muscle, midline injection  -Corrugator muscle, bilateral injection  -Frontalis muscle, bilateral injection, with 2 sites each side, medial injection was performed in the upper one third of the frontalis muscle, in the region vertical from the medial inferior edge  of the superior orbital rim. The lateral injection was again in the upper one third of the forehead vertically above the lateral limbus of the cornea, 1.5 cm lateral to the medial injection site.  -Temporalis muscle injection, 4  sites, bilaterally. The first injection was 3 cm above the tragus of the ear, second injection site was 1.5 cm to 3 cm up from the first injection site in line with the tragus of the ear. The third injection site was 1.5-3 cm forward between the first 2 injection sites. The fourth injection site was 1.5 cm posterior to the second injection site. 5th site laterally in the temporalis  muscleat the level of the outer canthus.  -Occipitalis muscle injection, 3 sites, bilaterally. The first injection was done one half way between the occipital protuberance and the tip of the mastoid process behind the ear. The second injection site was done lateral and superior to the first, 1 fingerbreadth from the first injection. The third injection site was 1 fingerbreadth superiorly and medially from the first injection site.  -Cervical paraspinal muscle injection, 2 sites, bilaterally. The first injection site was 1 cm from the midline of the cervical spine, 3 cm inferior to the lower border of the occipital protuberance. The second injection site was 1.5 cm superiorly and laterally to the first injection site.  -Trapezius muscle injection was performed at 3 sites, bilaterally. The first injection site was in the upper trapezius muscle halfway between the inflection point of the neck, and the acromion. The second injection site was one half way between the acromion and the first injection site. The third injection was done between the first injection site and the inflection point of the neck.  -Masseter muscle injections placed bilaterally    Will return for repeat injection in 3 months.   A total of 200 units of Botox  was prepared, 165 units of Botox  was injected as documented above, any Botox  not injected was wasted. The patient tolerated the procedure well, there were no complications of the above procedure.

## 2023-09-18 ENCOUNTER — Encounter: Payer: Self-pay | Admitting: Family Medicine

## 2023-09-18 ENCOUNTER — Telehealth: Payer: Self-pay | Admitting: Neurology

## 2023-09-18 ENCOUNTER — Ambulatory Visit: Payer: Self-pay | Admitting: Family Medicine

## 2023-09-18 VITALS — BP 115/67 | HR 85

## 2023-09-18 DIAGNOSIS — G43709 Chronic migraine without aura, not intractable, without status migrainosus: Secondary | ICD-10-CM | POA: Diagnosis not present

## 2023-09-18 DIAGNOSIS — L718 Other rosacea: Secondary | ICD-10-CM | POA: Insufficient documentation

## 2023-09-18 MED ORDER — ONABOTULINUMTOXINA 200 UNITS IJ SOLR
155.0000 [IU] | Freq: Once | INTRAMUSCULAR | Status: AC
  Administered 2023-09-18: 165 [IU] via INTRAMUSCULAR

## 2023-09-18 MED ORDER — RIZATRIPTAN BENZOATE 10 MG PO TBDP: ORAL_TABLET | ORAL | 11 refills | Status: AC

## 2023-09-18 NOTE — Telephone Encounter (Signed)
 Called Cole back at [phone]. Needing to know how many headaches per month she is having now. Aware I will confirm with NP who saw pt today and call back.   Per note: Baseline >15 headache days with at least 10 being described as migrainous

## 2023-09-18 NOTE — Telephone Encounter (Signed)
 They are wanting to know how many headaches per month right now. Just wanting to confirm that is >15 right now?

## 2023-09-18 NOTE — Telephone Encounter (Signed)
 I called and spoke w/ [name]. Relayed pt headache days per month >15 currently. He verbalized understanding. Nothing further needed.

## 2023-09-18 NOTE — Telephone Encounter (Addendum)
 Cole from [facility] is asking for clinical info from RN re: rizatriptan  (MAXALT -MLT) 10 MG disintegrating tablet , he needs to know how many headaches per month pt has for insurance , please call [phone]

## 2023-09-18 NOTE — Progress Notes (Signed)
 Botox - 200 units x 1 vial Lot: [id number] Expiration: 12/2025 NDC: 9976-6078-97  Bacteriostatic 0.9% Sodium Chloride - 4 mL  Lot: [id number] Expiration: 01/09/2025 NDC: 9590-8033-97  Dx: H56.290  B/B Witnessed by Maurilio Molt, RN

## 2023-10-24 NOTE — Progress Notes (Shared)
 [facility] - Clinic Note  10/30/2023     CHIEF COMPLAINT Patient presents for No chief complaint on file.  HISTORY OF PRESENT ILLNESS: Caitlin Duarte is a 38 y.o. female who presents to the clinic today for:    Patient states the eyes have been dry -- using Restasis.  Referring physician: Clem Brands, PA-C [address] [place],  [place] [address]  HISTORICAL INFORMATION:   Selected notes from the MEDICAL RECORD NUMBER Referred by Clem Brands concern of inferior retinal breaks OS   CURRENT MEDICATIONS: Current Outpatient Medications (Ophthalmic Drugs)  Medication Sig   Oxymetazoline  HCl (UPNEEQ ) 0.1 % SOLN Apply 1 drop to eye daily as needed.   RESTASIS 0.05 % ophthalmic emulsion 1 drop 2 (two) times daily.   No current facility-administered medications for this visit. (Ophthalmic Drugs)   Current Outpatient Medications (Other)  Medication Sig   botulinum toxin Type A  (BOTOX ) 200 units injection Provider to inject 155 units into the muscles of the head and neck every 12 weeks. Discard remainder.   levocetirizine (XYZAL) 5 MG tablet Take 5 mg by mouth every evening.   rizatriptan  (MAXALT -MLT) 10 MG disintegrating tablet DISSOLVE ONE TABLET ON TONGUE AT ONSET OF HEADACHE. MAY REPEAT DOSE WITH ONE TABLET IN TWO HOURS IF NEEDED. DO NOT EXCEED TWO TABLETS IN 24 HOURS.   UNABLE TO FIND Med Name: Compound eye drop for ocular rosacea (steriod) Dexamethasone  PF 0.01%   No current facility-administered medications for this visit. (Other)   REVIEW OF SYSTEMS:    ALLERGIES Allergies  Allergen Reactions   Other Anaphylaxis and Shortness Of Breath    curry   PAST MEDICAL HISTORY Past Medical History:  Diagnosis Date   Allergy     Asthma    ?allergy  induced asthma   Migraines    Rosacea conjunctivitis of both eyes    Seasonal allergies    Past Surgical History:  Procedure Laterality Date   APPENDECTOMY  2008   EYE MUSCLE SURGERY  1997    NOSE SURGERY  2006   RETINAL LASER PROCEDURE     SINUS SURGERY WITH INSTATRAK     TONSILLECTOMY  2020   WISDOM TOOTH EXTRACTION  2006   FAMILY HISTORY Family History  Problem Relation Age of Onset   Hypertension Father    Diabetes Father    Diverticulitis Father    Anemia Sister    Thyroid  disease Sister    Headache Sister        ?migraines   Diverticulitis Sister    Colon polyps Sister    Alzheimer's disease Maternal Grandfather    Stroke Maternal Grandfather    Diabetes Paternal Grandmother    Hypertension Paternal Grandmother    Heart disease Paternal Grandmother    Heart attack Paternal Grandmother    Diabetes Paternal Grandfather    Hypertension Paternal Grandfather    Colon cancer Neg Hx    Pancreatic cancer Neg Hx    Esophageal cancer Neg Hx    Liver cancer Neg Hx    Stomach cancer Neg Hx    SOCIAL HISTORY Social History   Tobacco Use   Smoking status: Never   Smokeless tobacco: Never  Vaping Use   Vaping status: Never Used  Substance Use Topics   Alcohol use: Never    Comment: socially   Drug use: No       OPHTHALMIC EXAM:  Not recorded     IMAGING AND PROCEDURES  Imaging and  Procedures for @TODAY @          ASSESSMENT/PLAN:    ICD-10-CM   1. Ocular rosacea  L71.8     2. Scleritis and episcleritis of both eyes  H15.003    H15.103     3. Lattice degeneration of both retinas  H35.413     4. Retinal hole of both eyes  H33.323     5. Choroidal nevus of left eye  D31.32     6. Serous retinal detachment of left eye  H33.22     7. Myopia of both eyes with astigmatism  H52.13    H52.203     8. History of strabismus surgery  Z98.890        1,2. Lattice degeneration w/ atrophic holes, both eyes  - OD: patches of lattice from 1130-12 and at 0730  - OS: patches of lattice from 11-130 and 0430-0600  - s/p laser retinopexy OS (07.31.20) - good laser changes surrounding all lesions  - s/p laser retinopexy OD (08.07.20) - good laser  surrounding all lesions - new development of lattice degeneration at 1245 and 0200-0300 -- s/p laser retinopexy (06.17.22) -- good laser changes in place  - no new RT/RD or lattice  - f/u 9 months, DFE, OCT  3,4. Ocular rosacea w/ history of scleritis/episcleritis OU  - first flare in Dec 2023 -- multiple recurrences  - managed by [facility] Tobin at [facility]  - patient finished Doxycycline and topical preservative-free Dexamethasone  - failed Indomethacin tx  - exam today with BCVA 20/20 OU; no inflammation on exam  5,6. Choroidal nevus OS with history of +SRF/focal serous RD  - relatively flat, pigmented, subfoveal nevus  - ~1DD in size  - no orange pigment or drusen  - focal SRF noted on exam 8.6.21 overlying nevus --stably resolved today - now under the expert management of Dr. Beatris, who has treated w/ IVA x3 (08.12.21, 09.15.21, 10.19.21) - OCT shows subfoveal hyperreflective choroidal lesion with stable improvement in SRF/edema -- SRF stably resolved  7. High myopia w/ astigmatism OU - discussed association of high myopia with lattice degeneration and increased risk of RT/RD  - RD warnings given - Patient has family history of high myopia (Mother and maternal Grandmother--Grandmother has history of RD and Fuchs Dystrophy--history of cornea transplant)  8.  Hx of strabismus surgery  - hx of muscle surgery at age 28 with Dr. FABIENE Gaudy (20/20-)  Ophthalmic Meds Ordered this visit:  No orders of the defined types were placed in this encounter.    No follow-ups on file.  There are no Patient Instructions on file for this visit.  Explained the diagnoses, plan, and follow up with the patient and they expressed understanding.  Patient expressed understanding of the importance of proper follow up care.   This document serves as a record of services personally performed by Redell JUDITHANN Hans, MD, PhD. It was created on their behalf by Avelina Pereyra, COA an ophthalmic technician. The creation of  this record is the provider's dictation and/or activities during the visit.   Electronically signed by: Avelina GORMAN Pereyra, COT  10/24/23  2:55 PM    Redell JUDITHANN Hans, M.D., Ph.D. Diseases & Surgery of the Retina and Vitreous [facility]    Abbreviations: M myopia (nearsighted); A astigmatism; H hyperopia (farsighted); P presbyopia; Mrx spectacle prescription;  CTL contact lenses; OD right eye; OS left eye; OU both eyes  XT exotropia; ET esotropia; PEK punctate epithelial keratitis;  PEE punctate epithelial erosions; DES dry eye syndrome; MGD meibomian gland dysfunction; ATs artificial tears; PFAT's preservative free artificial tears; NSC nuclear sclerotic cataract; PSC posterior subcapsular cataract; ERM epi-retinal membrane; PVD posterior vitreous detachment; RD retinal detachment; DM diabetes mellitus; DR diabetic retinopathy; NPDR non-proliferative diabetic retinopathy; PDR proliferative diabetic retinopathy; CSME clinically significant macular edema; DME diabetic macular edema; dbh dot blot hemorrhages; CWS cotton wool spot; POAG primary open angle glaucoma; C/D cup-to-disc ratio; HVF humphrey visual field; GVF goldmann visual field; OCT optical coherence tomography; IOP intraocular pressure; BRVO Branch retinal vein occlusion; CRVO central retinal vein occlusion; CRAO central retinal artery occlusion; BRAO branch retinal artery occlusion; RT retinal tear; SB scleral buckle; PPV pars plana vitrectomy; VH Vitreous hemorrhage; PRP panretinal laser photocoagulation; IVK intravitreal kenalog; VMT vitreomacular traction; MH Macular hole;  NVD neovascularization of the disc; NVE neovascularization elsewhere; AREDS age related eye disease study; ARMD age related macular degeneration; POAG primary open angle glaucoma; EBMD epithelial/anterior basement membrane dystrophy; ACIOL anterior chamber intraocular lens; IOL intraocular lens; PCIOL posterior chamber intraocular lens; Phaco/IOL  phacoemulsification with intraocular lens placement; PRK photorefractive keratectomy; LASIK laser assisted in situ keratomileusis; HTN hypertension; DM diabetes mellitus; COPD chronic obstructive pulmonary disease

## 2023-10-30 ENCOUNTER — Encounter (INDEPENDENT_AMBULATORY_CARE_PROVIDER_SITE_OTHER): Payer: 59 | Admitting: Ophthalmology

## 2023-10-31 ENCOUNTER — Telehealth: Payer: Self-pay | Admitting: Family Medicine

## 2023-10-31 NOTE — Telephone Encounter (Signed)
 Sabrina From [facility] called to give Key for PA for PT botulinum toxin Type A  (BOTOX ) 200 units injection   Key#[id number]

## 2023-10-31 NOTE — Telephone Encounter (Signed)
 Completed auth request, status is pending.

## 2023-11-04 NOTE — Telephone Encounter (Signed)
 Received approval, relayed info to [facility].  Auth#: [id number] (10/31/23-04/28/24)

## 2023-11-21 NOTE — Telephone Encounter (Signed)
 LVM advising pt that her [employer] plan is coming up as inactive as of 9/11. Requested call back with new insurance info.

## 2023-12-11 ENCOUNTER — Ambulatory Visit: Admitting: Family Medicine

## 2023-12-12 NOTE — Telephone Encounter (Signed)
 LVM x2 requesting cb with new insurance info, advised we will have to cancel 10/8 appt if we don't hear back from her.

## 2023-12-16 NOTE — Telephone Encounter (Signed)
 Pt called in new [employer]. I called [facility] provider line to see if shara is required, spoke with [facility]. He states no PA is required for [phone] or [phone]. She will be B/B for this appt since it's so close and I will transition her to [facility] after. Reference # for the call is [id number] [phone].

## 2023-12-17 NOTE — Progress Notes (Unsigned)
 12/18/23 ALL: Emelin returns for Botox . She is doing well. Migraines are significantly improved on Botox . She notes more muscle tightness and clenching toward end of Botox  cycle which triggers more migraines. Rizatriptan  helps.   09/18/2023 ALL: Caitlin Duarte returns for Botox . She was last seen by Dr Ines 08/2022 for first procedure and tolerated it well. Baseline >15 headache days with at least 10 being described as migrainous. Rizatriptan  helps but she has needed to take second dose more often. She does clench. Masseter injections were helpful at previous visit.     Consent Form Botulism Toxin Injection For Chronic Migraine    Reviewed orally with patient, additionally signature is on file:  Botulism toxin has been approved by the Federal drug administration for treatment of chronic migraine. Botulism toxin does not cure chronic migraine and it may not be effective in some patients.  The administration of botulism toxin is accomplished by injecting a small amount of toxin into the muscles of the neck and head. Dosage must be titrated for each individual. Any benefits resulting from botulism toxin tend to wear off after 3 months with a repeat injection required if benefit is to be maintained. Injections are usually done every 3-4 months with maximum effect peak achieved by about 2 or 3 weeks. Botulism toxin is expensive and you should be sure of what costs you will incur resulting from the injection.  The side effects of botulism toxin use for chronic migraine may include:   -Transient, and usually mild, facial weakness with facial injections  -Transient, and usually mild, head or neck weakness with head/neck injections  -Reduction or loss of forehead facial animation due to forehead muscle weakness  -Eyelid drooping  -Dry eye  -Pain at the site of injection or bruising at the site of injection  -Double vision  -Potential unknown long term risks   Contraindications: You should not have  Botox  if you are pregnant, nursing, allergic to albumin, have an infection, skin condition, or muscle weakness at the site of the injection, or have myasthenia gravis, Lambert-Eaton syndrome, or ALS.  It is also possible that as with any injection, there may be an allergic reaction or no effect from the medication. Reduced effectiveness after repeated injections is sometimes seen and rarely infection at the injection site may occur. All care will be taken to prevent these side effects. If therapy is given over a long time, atrophy and wasting in the muscle injected may occur. Occasionally the patient's become refractory to treatment because they develop antibodies to the toxin. In this event, therapy needs to be modified.  I have read the above information and consent to the administration of botulism toxin.    BOTOX  PROCEDURE NOTE FOR MIGRAINE HEADACHE  Contraindications and precautions discussed with patient(above). Aseptic procedure was observed and patient tolerated procedure. Procedure performed by Greig Forbes, FNP-C.   The condition has existed for more than 6 months, and pt does not have a diagnosis of ALS, Myasthenia Gravis or Lambert-Eaton Syndrome.  Risks and benefits of injections discussed and pt agrees to proceed with the procedure.  Written consent obtained  These injections are medically necessary. Pt  receives good benefits from these injections. These injections do not cause sedations or hallucinations which the oral therapies may cause.   Description of procedure:  The patient was placed in a sitting position. The standard protocol was used for Botox  as follows, with 5 units of Botox  injected at each site:  -Procerus muscle, midline injection  -  Corrugator muscle, bilateral injection  -Frontalis muscle, bilateral injection, with 2 sites each side, medial injection was performed in the upper one third of the frontalis muscle, in the region vertical from the medial inferior edge  of the superior orbital rim. The lateral injection was again in the upper one third of the forehead vertically above the lateral limbus of the cornea, 1.5 cm lateral to the medial injection site.  -Temporalis muscle injection, 4 sites, bilaterally. The first injection was 3 cm above the tragus of the ear, second injection site was 1.5 cm to 3 cm up from the first injection site in line with the tragus of the ear. The third injection site was 1.5-3 cm forward between the first 2 injection sites. The fourth injection site was 1.5 cm posterior to the second injection site. 5th site laterally in the temporalis  muscleat the level of the outer canthus.  -Occipitalis muscle injection, 3 sites, bilaterally. The first injection was done one half way between the occipital protuberance and the tip of the mastoid process behind the ear. The second injection site was done lateral and superior to the first, 1 fingerbreadth from the first injection. The third injection site was 1 fingerbreadth superiorly and medially from the first injection site.  -Cervical paraspinal muscle injection, 2 sites, bilaterally. The first injection site was 1 cm from the midline of the cervical spine, 3 cm inferior to the lower border of the occipital protuberance. The second injection site was 1.5 cm superiorly and laterally to the first injection site.  -Trapezius muscle injection was performed at 3 sites, bilaterally. The first injection site was in the upper trapezius muscle halfway between the inflection point of the neck, and the acromion. The second injection site was one half way between the acromion and the first injection site. The third injection was done between the first injection site and the inflection point of the neck.  -Masseter muscle injections placed bilaterally    Will return for repeat injection in 3 months.   A total of 200 units of Botox  was prepared, 165 units of Botox  was injected as documented above, any  Botox  not injected was wasted. The patient tolerated the procedure well, there were no complications of the above procedure.

## 2023-12-18 ENCOUNTER — Encounter: Payer: Self-pay | Admitting: Family Medicine

## 2023-12-18 ENCOUNTER — Ambulatory Visit: Admitting: Family Medicine

## 2023-12-18 DIAGNOSIS — H15009 Unspecified scleritis, unspecified eye: Secondary | ICD-10-CM | POA: Insufficient documentation

## 2023-12-18 DIAGNOSIS — G43709 Chronic migraine without aura, not intractable, without status migrainosus: Secondary | ICD-10-CM

## 2023-12-18 DIAGNOSIS — H16229 Keratoconjunctivitis sicca, not specified as Sjogren's, unspecified eye: Secondary | ICD-10-CM | POA: Insufficient documentation

## 2023-12-18 MED ORDER — ONABOTULINUMTOXINA 200 UNITS IJ SOLR
155.0000 [IU] | Freq: Once | INTRAMUSCULAR | Status: AC
  Administered 2023-12-18: 155 [IU] via INTRAMUSCULAR

## 2023-12-18 NOTE — Progress Notes (Signed)
 Botox - 200 units x 1 vial Lot: [id number] Expiration: 08/2025 NDC: 9976-6078-97  Bacteriostatic 0.9% Sodium Chloride - 4 mL  Lot: [id number] Expiration: 01/09/2025 NDC: 9590-8033-97  Dx: H56.290 B/B Witnessed by Maurilio Molt, RN

## 2024-02-28 ENCOUNTER — Encounter: Payer: Self-pay | Admitting: Family Medicine

## 2024-03-03 ENCOUNTER — Other Ambulatory Visit: Payer: Self-pay

## 2024-03-03 MED ORDER — BOTOX 200 UNITS IJ SOLR: 200.0000 [IU] | INTRAMUSCULAR | 1 refills | Status: AC

## 2024-03-03 NOTE — Telephone Encounter (Signed)
Sent script in

## 2024-03-03 NOTE — Telephone Encounter (Signed)
 Her PBM will not approve due to plan exclusion. Medical benefit covers with no PA required. Please send rx to [facility]- thank you! Previous rx is under Dr. Ines.

## 2024-03-17 ENCOUNTER — Ambulatory Visit: Admitting: Family Medicine

## 2024-03-20 ENCOUNTER — Telehealth: Payer: Self-pay | Admitting: Family Medicine

## 2024-03-20 NOTE — Telephone Encounter (Signed)
 [facility]) Schedule delivery of Botox  on 03/23/24 with signature required.

## 2024-03-23 NOTE — Telephone Encounter (Signed)
Updated appt note

## 2024-03-24 ENCOUNTER — Ambulatory Visit: Admitting: Family Medicine

## 2024-03-24 ENCOUNTER — Encounter: Payer: Self-pay | Admitting: Family Medicine

## 2024-03-24 VITALS — BP 116/80 | HR 90

## 2024-03-24 DIAGNOSIS — G43709 Chronic migraine without aura, not intractable, without status migrainosus: Secondary | ICD-10-CM | POA: Diagnosis not present

## 2024-03-24 MED ORDER — ONABOTULINUMTOXINA 200 UNITS IJ SOLR
155.0000 [IU] | Freq: Once | INTRAMUSCULAR | Status: AC
  Administered 2024-03-24: 165 [IU] via INTRAMUSCULAR

## 2024-03-24 NOTE — Progress Notes (Signed)
 "   03/24/2024 ALL: Caitlin Duarte returns for Botox . She continues to do well. She has treated 2-3 migraines since last visit. Masseter injections have been helpful.   12/18/2023 ALL: Caitlin Duarte returns for Botox . She is doing well. Migraines are significantly improved on Botox . She notes more muscle tightness and clenching toward end of Botox  cycle which triggers more migraines. Rizatriptan  helps.   09/18/2023 ALL: Caitlin Duarte returns for Botox . She was last seen by Dr Ines 08/2022 for first procedure and tolerated it well. Baseline >15 headache days with at least 10 being described as migrainous. Rizatriptan  helps but she has needed to take second dose more often. She does clench. Masseter injections were helpful at previous visit.     Consent Form Botulism Toxin Injection For Chronic Migraine    Reviewed orally with patient, additionally signature is on file:  Botulism toxin has been approved by the Federal drug administration for treatment of chronic migraine. Botulism toxin does not cure chronic migraine and it may not be effective in some patients.  The administration of botulism toxin is accomplished by injecting a small amount of toxin into the muscles of the neck and head. Dosage must be titrated for each individual. Any benefits resulting from botulism toxin tend to wear off after 3 months with a repeat injection required if benefit is to be maintained. Injections are usually done every 3-4 months with maximum effect peak achieved by about 2 or 3 weeks. Botulism toxin is expensive and you should be sure of what costs you will incur resulting from the injection.  The side effects of botulism toxin use for chronic migraine may include:   -Transient, and usually mild, facial weakness with facial injections  -Transient, and usually mild, head or neck weakness with head/neck injections  -Reduction or loss of forehead facial animation due to forehead muscle weakness  -Eyelid drooping  -Dry eye  -Pain at the  site of injection or bruising at the site of injection  -Double vision  -Potential unknown long term risks   Contraindications: You should not have Botox  if you are pregnant, nursing, allergic to albumin, have an infection, skin condition, or muscle weakness at the site of the injection, or have myasthenia gravis, Lambert-Eaton syndrome, or ALS.  It is also possible that as with any injection, there may be an allergic reaction or no effect from the medication. Reduced effectiveness after repeated injections is sometimes seen and rarely infection at the injection site may occur. All care will be taken to prevent these side effects. If therapy is given over a long time, atrophy and wasting in the muscle injected may occur. Occasionally the patient's become refractory to treatment because they develop antibodies to the toxin. In this event, therapy needs to be modified.  I have read the above information and consent to the administration of botulism toxin.    BOTOX  PROCEDURE NOTE FOR MIGRAINE HEADACHE  Contraindications and precautions discussed with patient(above). Aseptic procedure was observed and patient tolerated procedure. Procedure performed by Greig Forbes, FNP-C.   The condition has existed for more than 6 months, and pt does not have a diagnosis of ALS, Myasthenia Gravis or Lambert-Eaton Syndrome.  Risks and benefits of injections discussed and pt agrees to proceed with the procedure.  Written consent obtained  These injections are medically necessary. Pt  receives good benefits from these injections. These injections do not cause sedations or hallucinations which the oral therapies may cause.   Description of procedure:  The patient was placed in  a sitting position. The standard protocol was used for Botox  as follows, with 5 units of Botox  injected at each site:  -Procerus muscle, midline injection  -Corrugator muscle, bilateral injection  -Frontalis muscle, bilateral injection,  with 2 sites each side, medial injection was performed in the upper one third of the frontalis muscle, in the region vertical from the medial inferior edge of the superior orbital rim. The lateral injection was again in the upper one third of the forehead vertically above the lateral limbus of the cornea, 1.5 cm lateral to the medial injection site.  -Temporalis muscle injection, 4 sites, bilaterally. The first injection was 3 cm above the tragus of the ear, second injection site was 1.5 cm to 3 cm up from the first injection site in line with the tragus of the ear. The third injection site was 1.5-3 cm forward between the first 2 injection sites. The fourth injection site was 1.5 cm posterior to the second injection site. 5th site laterally in the temporalis  muscleat the level of the outer canthus.  -Occipitalis muscle injection, 3 sites, bilaterally. The first injection was done one half way between the occipital protuberance and the tip of the mastoid process behind the ear. The second injection site was done lateral and superior to the first, 1 fingerbreadth from the first injection. The third injection site was 1 fingerbreadth superiorly and medially from the first injection site.  -Cervical paraspinal muscle injection, 2 sites, bilaterally. The first injection site was 1 cm from the midline of the cervical spine, 3 cm inferior to the lower border of the occipital protuberance. The second injection site was 1.5 cm superiorly and laterally to the first injection site.  -Trapezius muscle injection was performed at 3 sites, bilaterally. The first injection site was in the upper trapezius muscle halfway between the inflection point of the neck, and the acromion. The second injection site was one half way between the acromion and the first injection site. The third injection was done between the first injection site and the inflection point of the neck.  -Masseter muscle injections placed bilaterally     Will return for repeat injection in 3 months.   A total of 200 units of Botox  was prepared, 165 units of Botox  was injected as documented above, any Botox  not injected was wasted. The patient tolerated the procedure well, there were no complications of the above procedure.  "

## 2024-03-24 NOTE — Progress Notes (Signed)
 Botox - 200 units x 1 vial Lot: [id number] Expiration: 2028/03 NDC: 0023-3921-02  Bacteriostatic 0.9% Sodium Chloride - 4 mL  Lot: [id number] Expiration: 2027/MAR/31 NDC: 9590803397  Dx: G43.709  S/P Witnessed by SHERROD RMA

## 2024-04-07 ENCOUNTER — Ambulatory Visit (INDEPENDENT_AMBULATORY_CARE_PROVIDER_SITE_OTHER): Payer: Self-pay | Admitting: Family Medicine

## 2024-04-07 ENCOUNTER — Encounter: Payer: Self-pay | Admitting: Family Medicine

## 2024-04-07 VITALS — BP 110/74 | HR 89 | Temp 98.7°F | Ht 64.0 in | Wt 138.4 lb

## 2024-04-07 DIAGNOSIS — G43709 Chronic migraine without aura, not intractable, without status migrainosus: Secondary | ICD-10-CM | POA: Diagnosis not present

## 2024-04-07 DIAGNOSIS — Z13220 Encounter for screening for lipoid disorders: Secondary | ICD-10-CM | POA: Diagnosis not present

## 2024-04-07 DIAGNOSIS — R5383 Other fatigue: Secondary | ICD-10-CM

## 2024-04-07 DIAGNOSIS — Z0000 Encounter for general adult medical examination without abnormal findings: Secondary | ICD-10-CM | POA: Insufficient documentation

## 2024-04-07 NOTE — Assessment & Plan Note (Signed)
 Overall health is excellent. Recommend 150 minutes or moderate-intensity exercise weekly. Discussed recommended screenings and immunizations. Declines influenza vaccine today.

## 2024-04-07 NOTE — Assessment & Plan Note (Signed)
 Stable. Continue to follow with neurology for Botox  injections. Continue rizatriptan  10 mg as needed.

## 2024-04-07 NOTE — Progress Notes (Signed)
 " [facility] [address] [place] [address] Dept: [phone] Dept Fax: [phone]  Annual Physical Visit  Subjective:    Patient ID: Caitlin Duarte, female    DOB: 1985-08-18, 39 y.o..   MRN: [id number]  Chief Complaint  Patient presents with   Annual Exam    Concerns with feeling fatigue   History of Present Illness:  Patient is in today for an annual physical/preventative visit.  Ms. Lisbon has a history of chronic migraines. She is currently treated with 155 units botulinum injections every 12 weeks and rizatriptan  10 mg.  Ms. Deak has a history of ocular rosacea and a choroidal nevus of the left eye. She follows with ophthalmology for these conditions.  Review of Systems  Constitutional:  Positive for malaise/fatigue. Negative for chills, diaphoresis, fever and weight loss.       Notes a generalized sense of lack of energy. Admits sleep is not consistent with frequent awakenings. Is not getting consistent exercise.  HENT:  Negative for congestion, ear pain, hearing loss, sinus pain, sore throat and tinnitus.   Eyes:  Negative for blurred vision, pain, discharge and redness.  Respiratory:  Negative for cough, shortness of breath and wheezing.   Cardiovascular:  Negative for chest pain and palpitations.  Gastrointestinal:  Negative for abdominal pain, constipation, diarrhea, heartburn, nausea and vomiting.  Genitourinary: Negative.   Musculoskeletal:  Negative for back pain, joint pain and myalgias.  Skin:  Negative for itching and rash.  Psychiatric/Behavioral:  Negative for depression. The patient is not nervous/anxious.    Past Medical History: Patient Active Problem List   Diagnosis Date Noted   Keratoconjunctivitis sicca 12/18/2023   Scleritis 12/18/2023   Ocular rosacea 09/18/2023   Trapezius strain, right, initial encounter 07/18/2021   Cervical strain 07/18/2021   Chronic nonspecific colitis 06/13/2021    Hepatic steatosis 06/13/2021   Right nephrolithiasis 06/13/2021   Other specified retinal disorders 10/20/2019   Chronic migraine without aura without status migrainosus, not intractable 03/05/2018   Chronic tonsillitis 08/02/2017   Allergic rhinitis 09/07/2013   Atypical nevus 09/07/2013   Past Surgical History:  Procedure Laterality Date   APPENDECTOMY  2008   EYE MUSCLE SURGERY  1997   NOSE SURGERY  2006   RETINAL LASER PROCEDURE     SINUS SURGERY WITH INSTATRAK     TONSILLECTOMY  2020   WISDOM TOOTH EXTRACTION  2006   Family History  Problem Relation Age of Onset   Hypertension Father    Diabetes Father    Diverticulitis Father    Anemia Sister    Thyroid  disease Sister    Headache Sister        ?migraines   Diverticulitis Sister    Colon polyps Sister    Alzheimer's disease Maternal Grandfather    Stroke Maternal Grandfather    Diabetes Paternal Grandmother    Hypertension Paternal Grandmother    Heart disease Paternal Grandmother    Heart attack Paternal Grandmother    Diabetes Paternal Grandfather    Hypertension Paternal Grandfather    Colon cancer Neg Hx    Pancreatic cancer Neg Hx    Esophageal cancer Neg Hx    Liver cancer Neg Hx    Stomach cancer Neg Hx    Outpatient Medications Prior to Visit  Medication Sig Dispense Refill   botulinum toxin Type A  (BOTOX ) 200 units injection Provider to inject 155 units into the muscles of the head and neck every 12 weeks.  Discard remainder. 1 each 3   botulinum toxin Type A  (BOTOX ) 200 units injection Inject 200 Units into the muscle every 3 (three) months. 1 each 1   levocetirizine (XYZAL) 5 MG tablet Take 5 mg by mouth every evening.     Oxymetazoline  HCl (UPNEEQ ) 0.1 % SOLN Apply 1 drop to eye daily as needed. 18 each 0   RESTASIS 0.05 % ophthalmic emulsion 1 drop 2 (two) times daily.     rizatriptan  (MAXALT -MLT) 10 MG disintegrating tablet DISSOLVE ONE TABLET ON TONGUE AT ONSET OF HEADACHE. MAY REPEAT DOSE WITH ONE  TABLET IN TWO HOURS IF NEEDED. DO NOT EXCEED TWO TABLETS IN 24 HOURS. 12 tablet 11   UNABLE TO FIND Med Name: Compound eye drop for ocular rosacea (steriod) Dexamethasone  PF 0.01%     No facility-administered medications prior to visit.   Allergies[1] Objective:   Today's Vitals   04/07/24 1454  BP: 110/74  Pulse: 89  Temp: 98.7 F (37.1 C)  SpO2: 97%  Weight: 138 lb 6.4 oz (62.8 kg)  Height: 5' 4 (1.626 m)   Body mass index is 23.76 kg/m.   General: Well developed, well nourished. No acute distress. HEENT: Normocephalic, non-traumatic. PERRL, EOMI. Conjunctiva clear. External ears normal. EAC and TMs normal   bilaterally. Nose clear without congestion or rhinorrhea. Mucous membranes moist. Oropharynx clear. Good dentition. Neck: Supple. No lymphadenopathy. No thyromegaly. Lungs: Clear to auscultation bilaterally. No wheezing, rales or rhonchi. CV: RRR without murmurs or rubs. Pulses 2+ bilaterally. Abdomen: Soft, non-tender. Bowel sounds positive, normal pitch and frequency. No hepatosplenomegaly. No rebound or   guarding. Extremities: Full ROM. No joint swelling or tenderness. No edema noted. Skin: Warm and dry. No rashes. Psych: Alert and oriented. Normal mood and affect.  Health Maintenance Due  Topic Date Due   Hepatitis B Vaccines 19-59 Average Risk (1 of 3 - 19+ 3-dose series) Never done   Influenza Vaccine  10/11/2023   COVID-19 Vaccine (4 - 2025-26 season) 11/11/2023     Assessment & Plan:   Problem List Items Addressed This Visit       Cardiovascular and Mediastinum   Chronic migraine without aura without status migrainosus, not intractable   Stable. Continue to follow with neurology for Botox  injections. Continue rizatriptan  10 mg as needed.        Other   Annual physical exam - Primary   Overall health is excellent. Recommend 150 minutes or moderate-intensity exercise weekly. Discussed recommended screenings and immunizations. Declines influenza  vaccine today.       Other Visit Diagnoses       Other fatigue       Discussed getting adequate sleep, stress reduction, and regular exercise. I will check labs to screen for secondary causes.   Relevant Orders   CBC   Iron, TIBC and Ferritin Panel   VITAMIN D  25 Hydroxy (Vit-D Deficiency, Fractures)   TSH   Comprehensive metabolic panel with GFR     Screening for lipid disorders       Relevant Orders   Lipid panel       Return in about 1 year (around 04/07/2025) for Annual preventative care.   Garnette CHRISTELLA Simpler, MD  I,Eular Lagle,acting as a scribe for Garnette CHRISTELLA Simpler, MD.,have documented all relevant documentation on the behalf of Garnette CHRISTELLA Simpler, MD.  I, Garnette CHRISTELLA Simpler, MD, have reviewed all documentation for this visit. The documentation on 04/07/2024 for the exam, diagnosis, procedures, and orders are all accurate  and complete.      [1]  Allergies Allergen Reactions   Other Anaphylaxis and Shortness Of Breath    curry   "

## 2024-04-08 ENCOUNTER — Ambulatory Visit: Payer: Self-pay | Admitting: Family Medicine

## 2024-04-08 LAB — LIPID PANEL
Cholesterol: 186 mg/dL (ref 28–200)
HDL: 60.3 mg/dL
LDL Cholesterol: 109 mg/dL — ABNORMAL HIGH (ref 10–99)
NonHDL: 125.79
Total CHOL/HDL Ratio: 3
Triglycerides: 85 mg/dL (ref 10.0–149.0)
VLDL: 17 mg/dL (ref 0.0–40.0)

## 2024-04-08 LAB — CBC
HCT: 39.6 % (ref 36.0–46.0)
Hemoglobin: 13.3 g/dL (ref 12.0–15.0)
MCHC: 33.7 g/dL (ref 30.0–36.0)
MCV: 90 fl (ref 78.0–100.0)
Platelets: 370 10*3/uL (ref 150.0–400.0)
RBC: 4.4 Mil/uL (ref 3.87–5.11)
RDW: 13 % (ref 11.5–15.5)
WBC: 6.8 10*3/uL (ref 4.0–10.5)

## 2024-04-08 LAB — COMPREHENSIVE METABOLIC PANEL WITH GFR
ALT: 14 U/L (ref 3–35)
AST: 17 U/L (ref 5–37)
Albumin: 4.6 g/dL (ref 3.5–5.2)
Alkaline Phosphatase: 52 U/L (ref 39–117)
BUN: 12 mg/dL (ref 6–23)
CO2: 24 meq/L (ref 19–32)
Calcium: 9.4 mg/dL (ref 8.4–10.5)
Chloride: 106 meq/L (ref 96–112)
Creatinine, Ser: 0.45 mg/dL (ref 0.40–1.20)
GFR: 123.29 mL/min
Glucose, Bld: 69 mg/dL — ABNORMAL LOW (ref 70–99)
Potassium: 3.9 meq/L (ref 3.5–5.1)
Sodium: 138 meq/L (ref 135–145)
Total Bilirubin: 0.4 mg/dL (ref 0.2–1.2)
Total Protein: 6.4 g/dL (ref 6.0–8.3)

## 2024-04-08 LAB — IRON,TIBC AND FERRITIN PANEL
%SAT: 22 % (ref 16–45)
Ferritin: 10 ng/mL — ABNORMAL LOW (ref 16–154)
Iron: 86 ug/dL (ref 40–190)
TIBC: 395 ug/dL (ref 250–450)

## 2024-04-08 LAB — TSH: TSH: 1.7 u[IU]/mL (ref 0.35–5.50)

## 2024-04-08 LAB — VITAMIN D 25 HYDROXY (VIT D DEFICIENCY, FRACTURES): VITD: 29.55 ng/mL — ABNORMAL LOW (ref 30.00–100.00)

## 2024-06-30 ENCOUNTER — Ambulatory Visit: Admitting: Family Medicine
